# Patient Record
Sex: Female | Born: 1985
Health system: Southern US, Community
[De-identification: ages and names within clinical notes are randomized; demographics above are authoritative.]

## PROBLEM LIST (undated history)

## (undated) DIAGNOSIS — E785 Hyperlipidemia, unspecified: Secondary | ICD-10-CM

## (undated) DIAGNOSIS — O24419 Gestational diabetes mellitus in pregnancy, unspecified control: Secondary | ICD-10-CM

## (undated) DIAGNOSIS — F988 Other specified behavioral and emotional disorders with onset usually occurring in childhood and adolescence: Secondary | ICD-10-CM

## (undated) DIAGNOSIS — F419 Anxiety disorder, unspecified: Secondary | ICD-10-CM

## (undated) DIAGNOSIS — K760 Fatty (change of) liver, not elsewhere classified: Secondary | ICD-10-CM

## (undated) HISTORY — DX: Anxiety disorder, unspecified: F41.9

## (undated) HISTORY — DX: Hyperlipidemia, unspecified: E78.5

## (undated) HISTORY — DX: Fatty (change of) liver, not elsewhere classified: K76.0

## (undated) HISTORY — PX: WISDOM TOOTH EXTRACTION: SHX21

## (undated) HISTORY — DX: Other specified behavioral and emotional disorders with onset usually occurring in childhood and adolescence: F98.8

---

## 2005-12-25 ENCOUNTER — Ambulatory Visit: Payer: Self-pay | Admitting: Family Medicine

## 2011-07-29 ENCOUNTER — Ambulatory Visit (INDEPENDENT_AMBULATORY_CARE_PROVIDER_SITE_OTHER): Payer: 59 | Admitting: Family Medicine

## 2011-07-29 VITALS — BP 140/98 | HR 100 | Temp 98.7°F | Resp 16 | Ht 65.5 in | Wt 179.2 lb

## 2011-07-29 DIAGNOSIS — J019 Acute sinusitis, unspecified: Secondary | ICD-10-CM

## 2011-07-29 DIAGNOSIS — J329 Chronic sinusitis, unspecified: Secondary | ICD-10-CM

## 2011-07-29 NOTE — Progress Notes (Signed)
  Subjective:    Patient ID: Tonya [name], female    DOB: May 10, 1986, 26 y.o.   MRN: [id number]  HPI 5 day h/o sinus pressure sig congestion that is thick and yellow  Using q 4 hours mucinex and tylenol No fever and chills Face pain over Left maxillary sinus   Review of Systems  HENT: Positive for congestion, rhinorrhea, sneezing and postnasal drip.   Respiratory: Positive for cough (not productive). Negative for wheezing.   Musculoskeletal: Negative for myalgias.  Neurological: Negative for headaches.       Objective:   Physical Exam  Constitutional: She appears well-developed and well-nourished.  HENT:  Head: Normocephalic and atraumatic.  Left Ear: Left ear decreased hearing: creumenosis.  Nose: Mucosal edema and rhinorrhea present. Right sinus exhibits maxillary sinus tenderness. Left sinus exhibits maxillary sinus tenderness.  Mouth/Throat: Posterior oropharyngeal erythema present. No posterior oropharyngeal edema.  Eyes: Right eye exhibits no discharge. Left eye exhibits no discharge.  Neck: Neck supple.  Cardiovascular: Normal rate, regular rhythm and normal heart sounds.   Pulmonary/Chest: Effort normal and breath sounds normal.  Lymphadenopathy:    She has cervical adenopathy.  Neurological: She is alert.  Skin: Skin is warm.          Assessment & Plan:  Sinusitis  P) Amoxil 875mg  BID X 10 days      Flonase NS UAD      F/U INB or symptoms worsen

## 2011-09-07 ENCOUNTER — Ambulatory Visit (INDEPENDENT_AMBULATORY_CARE_PROVIDER_SITE_OTHER): Payer: 59 | Admitting: Physician Assistant

## 2011-09-07 VITALS — BP 132/83 | HR 85 | Temp 98.5°F | Resp 16 | Ht 64.5 in | Wt 180.4 lb

## 2011-09-07 DIAGNOSIS — J029 Acute pharyngitis, unspecified: Secondary | ICD-10-CM

## 2011-09-07 LAB — POCT CBC
Granulocyte percent: 77.1 %G (ref 37–80)
Hemoglobin: 12.9 g/dL (ref 12.2–16.2)
Lymph, poc: 1.4 (ref 0.6–3.4)
MCHC: 32.4 g/dL (ref 31.8–35.4)
MPV: 8.4 fL (ref 0–99.8)
POC Granulocyte: 6.2 (ref 2–6.9)
POC MID %: 5.3 %M (ref 0–12)
RDW, POC: 13.4 %

## 2011-09-07 LAB — POCT RAPID STREP A (OFFICE): Rapid Strep A Screen: NEGATIVE

## 2011-09-07 MED ORDER — GUAIFENESIN ER 1200 MG PO TB12: 1.0000 | ORAL_TABLET | Freq: Two times a day (BID) | ORAL | Status: DC | PRN

## 2011-09-07 MED ORDER — CEFDINIR 300 MG PO CAPS: 600.0000 mg | ORAL_CAPSULE | Freq: Every day | ORAL | Status: AC

## 2011-09-07 NOTE — Progress Notes (Signed)
  Subjective:    Patient ID: Tonya [name], female    DOB: [birth year], 26 y.o.   MRN: [id number]  HPI Presents with 2 days of illness. Primary complaint is of sorethroat, though she also has mild rhinitis, muscle aches and fatigue.  She works as an Charity fundraiser in oncology.  No GU/GI symptoms.  No fever/chills.  She did have a flu vaccine this season.  About a month ago she was seen here with sinusitis.  Her symptoms resolved for the most part, but she reports she hasn't quite returned to her previous state of health.   Review of Systems As above.    Objective:   Physical Exam Vital signs noted. Well-developed, well nourished WF who is awake, alert and oriented, in NAD. HEENT: [place]/AT, PERRL, EOMI.  Sclera and conjunctiva are clear.  EAC are patent, TMs are normal in appearance. Nasal mucosa is pink and moist. OP is clear. Neck: supple, non-tender, no lymphadenopathey, thyromegaly. Heart: RRR, no murmur Lungs: CTA Abdomen: normo-active bowel sounds, supple, non-tender, no mass or organomegaly. Extremities: no cyanosis, clubbing or edema. Skin: warm and dry without rash.  Results for orders placed in visit on 09/07/11  POCT RAPID STREP A (OFFICE)      Component Value Range   Rapid Strep A Screen Negative  Negative   POCT INFLUENZA A/B      Component Value Range   Influenza A, POC Negative     Influenza B, POC Negative    POCT CBC      Component Value Range   WBC 8.1  4.6 - 10.2 (K/uL)   Lymph, poc 1.4  0.6 - 3.4    POC LYMPH PERCENT 17.6  10 - 50 (%L)   MID (cbc) 0.4  0 - 0.9    POC MID % 5.3  0 - 12 (%M)   POC Granulocyte 6.2  2 - 6.9    Granulocyte percent 77.1  37 - 80 (%G)   RBC 4.41  4.04 - 5.48 (M/uL)   Hemoglobin 12.9  12.2 - 16.2 (g/dL)   HCT, POC 82.9  56.2 - 47.9 (%)   MCV 90.3  80 - 97 (fL)   MCH, POC 29.3  27 - 31.2 (pg)   MCHC 32.4  31.8 - 35.4 (g/dL)   RDW, POC 13.0     Platelet Count, POC 195  142 - 424 (K/uL)   MPV 8.4  0 - 99.8 (fL)   Throat culture is  pending.      Assessment & Plan:  Pharyngitis Viral URI vs. Persistent sub-acute sinusitis  Supportive care.  If symptoms persist, fill RX for cefdinir, to cover sinusitis.

## 2011-09-07 NOTE — Patient Instructions (Signed)
Get LOTS of rest.  Drink at least 64 ounces of water daily.  Use ibuprofen and acetaminophen for throat pain.  The Mucinex can help.  It is ok to add Zyrtec or Claritin.  If your symtoms worsen over the next several days, go ahead and fill the prescription for cefdinir.

## 2011-09-09 LAB — CULTURE, GROUP A STREP

## 2013-01-23 ENCOUNTER — Ambulatory Visit
Admission: RE | Admit: 2013-01-23 | Discharge: 2013-01-23 | Disposition: A | Discharge status: Home / Self Care | Payer: 59 | Source: Ambulatory Visit | Attending: Internal Medicine | Admitting: Internal Medicine

## 2013-01-23 ENCOUNTER — Other Ambulatory Visit: Payer: Self-pay | Admitting: Internal Medicine

## 2013-01-29 ENCOUNTER — Other Ambulatory Visit: Payer: Self-pay

## 2013-08-12 ENCOUNTER — Ambulatory Visit: Payer: Self-pay | Admitting: Emergency Medicine

## 2013-08-14 ENCOUNTER — Encounter: Payer: Self-pay | Admitting: *Deleted

## 2013-08-14 DIAGNOSIS — F419 Anxiety disorder, unspecified: Secondary | ICD-10-CM | POA: Insufficient documentation

## 2013-08-14 DIAGNOSIS — D649 Anemia, unspecified: Secondary | ICD-10-CM | POA: Insufficient documentation

## 2013-08-14 DIAGNOSIS — F988 Other specified behavioral and emotional disorders with onset usually occurring in childhood and adolescence: Secondary | ICD-10-CM | POA: Insufficient documentation

## 2013-08-18 ENCOUNTER — Ambulatory Visit (INDEPENDENT_AMBULATORY_CARE_PROVIDER_SITE_OTHER): Payer: 59 | Admitting: Emergency Medicine

## 2013-08-18 ENCOUNTER — Encounter: Payer: Self-pay | Admitting: Emergency Medicine

## 2013-08-18 VITALS — BP 108/72 | HR 78 | Temp 98.4°F | Resp 18 | Ht 65.5 in | Wt 177.0 lb

## 2013-08-18 DIAGNOSIS — E559 Vitamin D deficiency, unspecified: Secondary | ICD-10-CM

## 2013-08-18 DIAGNOSIS — Z79899 Other long term (current) drug therapy: Secondary | ICD-10-CM

## 2013-08-18 DIAGNOSIS — F988 Other specified behavioral and emotional disorders with onset usually occurring in childhood and adolescence: Secondary | ICD-10-CM

## 2013-08-18 DIAGNOSIS — E538 Deficiency of other specified B group vitamins: Secondary | ICD-10-CM

## 2013-08-18 DIAGNOSIS — E785 Hyperlipidemia, unspecified: Secondary | ICD-10-CM

## 2013-08-18 DIAGNOSIS — E782 Mixed hyperlipidemia: Secondary | ICD-10-CM

## 2013-08-18 LAB — CBC WITH DIFFERENTIAL/PLATELET
Basophils Absolute: 0.1 10*3/uL (ref 0.0–0.1)
Basophils Relative: 1 % (ref 0–1)
EOS PCT: 3 % (ref 0–5)
Eosinophils Absolute: 0.2 10*3/uL (ref 0.0–0.7)
HEMATOCRIT: 39.7 % (ref 36.0–46.0)
HEMOGLOBIN: 13.9 g/dL (ref 12.0–15.0)
LYMPHS ABS: 2.1 10*3/uL (ref 0.7–4.0)
LYMPHS PCT: 37 % (ref 12–46)
MCH: 31.6 pg (ref 26.0–34.0)
MCHC: 35 g/dL (ref 30.0–36.0)
MCV: 90.2 fL (ref 78.0–100.0)
MONO ABS: 0.3 10*3/uL (ref 0.1–1.0)
MONOS PCT: 6 % (ref 3–12)
NEUTROS ABS: 3 10*3/uL (ref 1.7–7.7)
Neutrophils Relative %: 53 % (ref 43–77)
Platelets: 240 10*3/uL (ref 150–400)
RBC: 4.4 MIL/uL (ref 3.87–5.11)
RDW: 12.9 % (ref 11.5–15.5)
WBC: 5.6 10*3/uL (ref 4.0–10.5)

## 2013-08-18 LAB — BASIC METABOLIC PANEL WITH GFR
BUN: 12 mg/dL (ref 6–23)
CHLORIDE: 105 meq/L (ref 96–112)
CO2: 24 meq/L (ref 19–32)
CREATININE: 0.74 mg/dL (ref 0.50–1.10)
Calcium: 9.8 mg/dL (ref 8.4–10.5)
GFR, Est African American: >89 mL/min
Glucose, Bld: 102 mg/dL — ABNORMAL HIGH (ref 70–99)
POTASSIUM: 4.1 meq/L (ref 3.5–5.3)
Sodium: 137 mEq/L (ref 135–145)

## 2013-08-18 LAB — LIPID PANEL
CHOLESTEROL: 177 mg/dL (ref 0–200)
HDL: 43 mg/dL (ref >39)
LDL CALC: 99 mg/dL (ref 0–99)
TRIGLYCERIDES: 173 mg/dL — AB (ref <150)
Total CHOL/HDL Ratio: 4.1 Ratio
VLDL: 35 mg/dL (ref 0–40)

## 2013-08-18 LAB — HEPATIC FUNCTION PANEL
ALT: 51 U/L — AB (ref 0–35)
AST: 25 U/L (ref 0–37)
Albumin: 4.8 g/dL (ref 3.5–5.2)
Alkaline Phosphatase: 43 U/L (ref 39–117)
BILIRUBIN INDIRECT: 0.4 mg/dL (ref 0.2–1.2)
Bilirubin, Direct: 0.1 mg/dL (ref 0.0–0.3)
TOTAL PROTEIN: 7.2 g/dL (ref 6.0–8.3)
Total Bilirubin: 0.5 mg/dL (ref 0.2–1.2)

## 2013-08-18 LAB — MAGNESIUM: Magnesium: 2 mg/dL (ref 1.5–2.5)

## 2013-08-18 LAB — VITAMIN B12: VITAMIN B 12: 635 pg/mL (ref 211–911)

## 2013-08-18 MED ORDER — AMPHETAMINE-DEXTROAMPHETAMINE 20 MG PO TABS: 20.0000 mg | ORAL_TABLET | Freq: Every day | ORAL | Status: DC

## 2013-08-18 MED ORDER — PRAVASTATIN SODIUM 40 MG PO TABS: 40.0000 mg | ORAL_TABLET | Freq: Every day | ORAL | Status: DC

## 2013-08-18 NOTE — Progress Notes (Signed)
Subjective:    Patient ID: Tonya [name], female    DOB: 1986-05-16, 28 y.o.   MRN: [id number]  HPI Comments: 28 yo female cholesterol f/u. She started Pravastatin at last OV and denies any SE. She has + FMHX of hyperlipidemia.  LAST LABS T 223 TG 184 L 146 ALT 66 HEP NEG, U/S ABDOMEN FATTY LIVER 01/24/13 She has improved diet and keeps active. She denies cardio outside of work.   She added Vitamins B12 and Magnesium AD at last OV and has seen mild increase with energy.  She notes Adderall has helped with focus especially at work and also helps her to stay awake, while working 3rd shift. She does not take RX unless she has to work or needs to focus on getting things done. She denies any SE.   Hyperlipidemia  Anxiety       Medication List       This list is accurate as of: 08/18/13 11:59 PM.  Always use your most recent med list.               ALPRAZolam 0.5 MG tablet  Commonly known as:  XANAX  Take 0.5 mg by mouth at bedtime as needed for anxiety.     amphetamine-dextroamphetamine 20 MG tablet  Commonly known as:  ADDERALL  Take 1 tablet (20 mg total) by mouth daily.     amphetamine-dextroamphetamine 20 MG tablet  Commonly known as:  ADDERALL  Take 1 tablet (20 mg total) by mouth daily.  Start taking on:  09/17/2013     amphetamine-dextroamphetamine 20 MG tablet  Commonly known as:  ADDERALL  Take 1 tablet (20 mg total) by mouth daily.  Start taking on:  10/18/2013     FLUoxetine 20 MG capsule  Commonly known as:  PROZAC  Take 60 mg by mouth daily.     Magnesium 250 MG Tabs  Take by mouth 2 (two) times daily.     norethindrone-ethinyl estradiol-iron 1.5-30 MG-MCG tablet  Commonly known as:  MICROGESTIN FE,GILDESS FE,LOESTRIN FE  Take 1 tablet by mouth daily.     pravastatin 40 MG tablet  Commonly known as:  PRAVACHOL  Take 1 tablet (40 mg total) by mouth daily.     vitamin B-12 1000 MCG tablet  Commonly known as:  CYANOCOBALAMIN  Take 2,000 mcg by mouth  daily.        No Known Allergies  Past Medical History  Diagnosis Date  . Anxiety   . ADD (attention deficit disorder)   . Anemia   . Hyperlipidemia       Review of Systems  All other systems reviewed and are negative.   BP 108/72  Pulse 78  Temp(Src) 98.4 F (36.9 C) (Temporal)  Resp 18  Ht 5' 5.5" (1.664 m)  Wt 177 lb (80.287 kg)  BMI 29.00 kg/m2  LMP 06/23/2013     Objective:   Physical Exam  Nursing note and vitals reviewed. Constitutional: She is oriented to person, place, and time. She appears well-developed and well-nourished. No distress.  HENT:  Head: Normocephalic and atraumatic.  Right Ear: External ear normal.  Left Ear: External ear normal.  Nose: Nose normal.  Mouth/Throat: Oropharynx is clear and moist.  Eyes: Conjunctivae and EOM are normal.  Neck: Normal range of motion. Neck supple. No JVD present. No thyromegaly present.  Cardiovascular: Normal rate, regular rhythm, normal heart sounds and intact distal pulses.   Pulmonary/Chest: Effort normal and breath sounds normal.  Abdominal: Soft.  Bowel sounds are normal. She exhibits no distension and no mass. There is no tenderness. There is no rebound and no guarding.  Musculoskeletal: Normal range of motion. She exhibits no edema and no tenderness.  Lymphadenopathy:    She has no cervical adenopathy.  Neurological: She is alert and oriented to person, place, and time. No cranial nerve deficit.  Skin: Skin is warm and dry. No rash noted. No erythema. No pallor.  Psychiatric: She has a normal mood and affect. Her behavior is normal. Judgment and thought content normal.          Assessment & Plan:  1. Cholesterol- recheck labs, Need to eat healthier and exercise AD. 2. ADD- Refill Adderall AD for 2/23, 3/25/ 4/25 Continue AD with good control 3. Vitamin D def/ Anemia- Recheck labs, take Vitamins AD increase green leafy veggies, increase H2O, w/c if any symptom increase.

## 2013-08-18 NOTE — Patient Instructions (Signed)
Fat and Cholesterol Control Diet  Fat and cholesterol levels in your blood and organs are influenced by your diet. High levels of fat and cholesterol may lead to diseases of the heart, small and large blood vessels, gallbladder, liver, and pancreas.  CONTROLLING FAT AND CHOLESTEROL WITH DIET  Although exercise and lifestyle factors are important, your diet is key. That is because certain foods are known to raise cholesterol and others to lower it. The goal is to balance foods for their effect on cholesterol and more importantly, to replace saturated and trans fat with other types of fat, such as monounsaturated fat, polyunsaturated fat, and omega-3 fatty acids.  On average, a person should consume no more than 15 to 17 g of saturated fat daily. Saturated and trans fats are considered "bad" fats, and they will raise LDL cholesterol. Saturated fats are primarily found in animal products such as meats, butter, and cream. However, that does not mean you need to give up all your favorite foods. Today, there are good tasting, low-fat, low-cholesterol substitutes for most of the things you like to eat. Choose low-fat or nonfat alternatives. Choose round or loin cuts of red meat. These types of cuts are lowest in fat and cholesterol. Chicken (without the skin), fish, veal, and ground turkey breast are great choices. Eliminate fatty meats, such as hot dogs and salami. Even shellfish have little or no saturated fat. Have a 3 oz (85 g) portion when you eat lean meat, poultry, or fish.  Trans fats are also called "partially hydrogenated oils." They are oils that have been scientifically manipulated so that they are solid at room temperature resulting in a longer shelf life and improved taste and texture of foods in which they are added. Trans fats are found in stick margarine, some tub margarines, cookies, crackers, and baked goods.   When baking and cooking, oils are a great substitute for butter. The monounsaturated oils are  especially beneficial since it is believed they lower LDL and raise HDL. The oils you should avoid entirely are saturated tropical oils, such as coconut and palm.   Remember to eat a lot from food groups that are naturally free of saturated and trans fat, including fish, fruit, vegetables, beans, grains (barley, rice, couscous, bulgur wheat), and pasta (without cream sauces).   IDENTIFYING FOODS THAT LOWER FAT AND CHOLESTEROL   Soluble fiber may lower your cholesterol. This type of fiber is found in fruits such as apples, vegetables such as broccoli, potatoes, and carrots, legumes such as beans, peas, and lentils, and grains such as barley. Foods fortified with plant sterols (phytosterol) may also lower cholesterol. You should eat at least 2 g per day of these foods for a cholesterol lowering effect.   Read package labels to identify low-saturated fats, trans fat free, and low-fat foods at the supermarket. Select cheeses that have only 2 to 3 g saturated fat per ounce. Use a heart-healthy tub margarine that is free of trans fats or partially hydrogenated oil. When buying baked goods (cookies, crackers), avoid partially hydrogenated oils. Breads and muffins should be made from whole grains (whole-wheat or whole oat flour, instead of "flour" or "enriched flour"). Buy non-creamy canned soups with reduced salt and no added fats.   FOOD PREPARATION TECHNIQUES   Never deep-fry. If you must fry, either stir-fry, which uses very little fat, or use non-stick cooking sprays. When possible, broil, bake, or roast meats, and steam vegetables. Instead of putting butter or margarine on vegetables, use lemon   and herbs, applesauce, and cinnamon (for squash and sweet potatoes). Use nonfat yogurt, salsa, and low-fat dressings for salads.   LOW-SATURATED FAT / LOW-FAT FOOD SUBSTITUTES  Meats / Saturated Fat (g)  · Avoid: Steak, marbled (3 oz/85 g) / 11 g  · Choose: Steak, lean (3 oz/85 g) / 4 g  · Avoid: Hamburger (3 oz/85 g) / 7  g  · Choose: Hamburger, lean (3 oz/85 g) / 5 g  · Avoid: Ham (3 oz/85 g) / 6 g  · Choose: Ham, lean cut (3 oz/85 g) / 2.4 g  · Avoid: Chicken, with skin, dark meat (3 oz/85 g) / 4 g  · Choose: Chicken, skin removed, dark meat (3 oz/85 g) / 2 g  · Avoid: Chicken, with skin, light meat (3 oz/85 g) / 2.5 g  · Choose: Chicken, skin removed, light meat (3 oz/85 g) / 1 g  Dairy / Saturated Fat (g)  · Avoid: Whole milk (1 cup) / 5 g  · Choose: Low-fat milk, 2% (1 cup) / 3 g  · Choose: Low-fat milk, 1% (1 cup) / 1.5 g  · Choose: Skim milk (1 cup) / 0.3 g  · Avoid: Hard cheese (1 oz/28 g) / 6 g  · Choose: Skim milk cheese (1 oz/28 g) / 2 to 3 g  · Avoid: Cottage cheese, 4% fat (1 cup) / 6.5 g  · Choose: Low-fat cottage cheese, 1% fat (1 cup) / 1.5 g  · Avoid: Ice cream (1 cup) / 9 g  · Choose: Sherbet (1 cup) / 2.5 g  · Choose: Nonfat frozen yogurt (1 cup) / 0.3 g  · Choose: Frozen fruit bar / trace  · Avoid: Whipped cream (1 tbs) / 3.5 g  · Choose: Nondairy whipped topping (1 tbs) / 1 g  Condiments / Saturated Fat (g)  · Avoid: Mayonnaise (1 tbs) / 2 g  · Choose: Low-fat mayonnaise (1 tbs) / 1 g  · Avoid: Butter (1 tbs) / 7 g  · Choose: Extra light margarine (1 tbs) / 1 g  · Avoid: Coconut oil (1 tbs) / 11.8 g  · Choose: Olive oil (1 tbs) / 1.8 g  · Choose: Corn oil (1 tbs) / 1.7 g  · Choose: Safflower oil (1 tbs) / 1.2 g  · Choose: Sunflower oil (1 tbs) / 1.4 g  · Choose: Soybean oil (1 tbs) / 2.4 g  · Choose: Canola oil (1 tbs) / 1 g  Document Released: 06/12/2005 Document Revised: 10/07/2012 Document Reviewed: 12/01/2010  [employer]® Patient Information ©2014 [employer].

## 2013-08-19 ENCOUNTER — Encounter: Payer: Self-pay | Admitting: Emergency Medicine

## 2013-08-19 DIAGNOSIS — E785 Hyperlipidemia, unspecified: Secondary | ICD-10-CM | POA: Insufficient documentation

## 2013-11-27 ENCOUNTER — Other Ambulatory Visit: Payer: Self-pay | Admitting: Emergency Medicine

## 2013-11-27 MED ORDER — AMPHETAMINE-DEXTROAMPHETAMINE 20 MG PO TABS: 20.0000 mg | ORAL_TABLET | Freq: Every day | ORAL | Status: DC

## 2013-12-03 ENCOUNTER — Telehealth: Payer: Self-pay | Admitting: Internal Medicine

## 2014-02-04 NOTE — Telephone Encounter (Signed)
error 

## 2014-03-11 ENCOUNTER — Encounter: Payer: Self-pay | Admitting: Emergency Medicine

## 2014-03-11 ENCOUNTER — Ambulatory Visit (INDEPENDENT_AMBULATORY_CARE_PROVIDER_SITE_OTHER): Payer: 59 | Admitting: Emergency Medicine

## 2014-03-11 VITALS — BP 120/68 | HR 92 | Temp 98.2°F | Resp 16 | Ht 65.5 in | Wt 178.0 lb

## 2014-03-11 DIAGNOSIS — Z0000 Encounter for general adult medical examination without abnormal findings: Secondary | ICD-10-CM

## 2014-03-11 DIAGNOSIS — F988 Other specified behavioral and emotional disorders with onset usually occurring in childhood and adolescence: Secondary | ICD-10-CM

## 2014-03-11 DIAGNOSIS — E782 Mixed hyperlipidemia: Secondary | ICD-10-CM

## 2014-03-11 DIAGNOSIS — L708 Other acne: Secondary | ICD-10-CM

## 2014-03-11 DIAGNOSIS — L709 Acne, unspecified: Secondary | ICD-10-CM

## 2014-03-11 LAB — CBC WITH DIFFERENTIAL/PLATELET
BASOS ABS: 0.1 10*3/uL (ref 0.0–0.1)
BASOS PCT: 1 % (ref 0–1)
EOS PCT: 2 % (ref 0–5)
Eosinophils Absolute: 0.1 10*3/uL (ref 0.0–0.7)
HEMATOCRIT: 40.2 % (ref 36.0–46.0)
Hemoglobin: 13.9 g/dL (ref 12.0–15.0)
Lymphocytes Relative: 27 % (ref 12–46)
Lymphs Abs: 1.8 10*3/uL (ref 0.7–4.0)
MCH: 30.5 pg (ref 26.0–34.0)
MCHC: 34.6 g/dL (ref 30.0–36.0)
MCV: 88.4 fL (ref 78.0–100.0)
MONO ABS: 0.3 10*3/uL (ref 0.1–1.0)
Monocytes Relative: 5 % (ref 3–12)
NEUTROS ABS: 4.4 10*3/uL (ref 1.7–7.7)
Neutrophils Relative %: 65 % (ref 43–77)
PLATELETS: 214 10*3/uL (ref 150–400)
RBC: 4.55 MIL/uL (ref 3.87–5.11)
RDW: 12.8 % (ref 11.5–15.5)
WBC: 6.8 10*3/uL (ref 4.0–10.5)

## 2014-03-11 LAB — MAGNESIUM: Magnesium: 2 mg/dL (ref 1.5–2.5)

## 2014-03-11 LAB — BASIC METABOLIC PANEL WITH GFR
BUN: 14 mg/dL (ref 6–23)
CALCIUM: 9.8 mg/dL (ref 8.4–10.5)
CO2: 27 mEq/L (ref 19–32)
Chloride: 103 mEq/L (ref 96–112)
Creat: 0.72 mg/dL (ref 0.50–1.10)
GLUCOSE: 82 mg/dL (ref 70–99)
POTASSIUM: 4.1 meq/L (ref 3.5–5.3)
SODIUM: 139 meq/L (ref 135–145)

## 2014-03-11 LAB — LIPID PANEL
CHOL/HDL RATIO: 4.5 ratio
Cholesterol: 192 mg/dL (ref 0–200)
HDL: 43 mg/dL (ref >39)
LDL CALC: 119 mg/dL — AB (ref 0–99)
Triglycerides: 152 mg/dL — ABNORMAL HIGH (ref <150)
VLDL: 30 mg/dL (ref 0–40)

## 2014-03-11 LAB — IRON AND TIBC
%SAT: 26 % (ref 20–55)
Iron: 97 ug/dL (ref 42–145)
TIBC: 380 ug/dL (ref 250–470)
UIBC: 283 ug/dL (ref 125–400)

## 2014-03-11 LAB — HEPATIC FUNCTION PANEL
ALBUMIN: 4.9 g/dL (ref 3.5–5.2)
ALK PHOS: 62 U/L (ref 39–117)
ALT: 29 U/L (ref 0–35)
AST: 20 U/L (ref 0–37)
BILIRUBIN DIRECT: 0.1 mg/dL (ref 0.0–0.3)
BILIRUBIN INDIRECT: 0.6 mg/dL (ref 0.2–1.2)
BILIRUBIN TOTAL: 0.7 mg/dL (ref 0.2–1.2)
Total Protein: 7.2 g/dL (ref 6.0–8.3)

## 2014-03-11 MED ORDER — DOXYCYCLINE HYCLATE 50 MG PO CAPS: 50.0000 mg | ORAL_CAPSULE | Freq: Two times a day (BID) | ORAL | Status: DC

## 2014-03-11 MED ORDER — CLINDAMYCIN PHOS-BENZOYL PEROX 1-5 % EX GEL: Freq: Two times a day (BID) | CUTANEOUS | Status: DC

## 2014-03-11 MED ORDER — AMPHETAMINE-DEXTROAMPHETAMINE 20 MG PO TABS: 20.0000 mg | ORAL_TABLET | Freq: Two times a day (BID) | ORAL | Status: DC

## 2014-03-11 NOTE — Patient Instructions (Signed)
Adderall 20 mg at beginning of shift and then an extra 1/2 = 10 mg at 4 hours into shift to see if helps focus  Attention Deficit Hyperactivity Disorder Attention deficit hyperactivity disorder (ADHD) is a problem with behavior issues based on the way the brain functions (neurobehavioral disorder). It is a common reason for behavior and academic problems in school. SYMPTOMS  There are 3 types of ADHD. The 3 types and some of the symptoms include:  Inattentive.  Gets bored or distracted easily.  Loses or forgets things. Forgets to hand in homework.  Has trouble organizing or completing tasks.  Difficulty staying on task.  An inability to organize daily tasks and school work.  Leaving projects, chores, or homework unfinished.  Trouble paying attention or responding to details. Careless mistakes.  Difficulty following directions. Often seems like is not listening.  Dislikes activities that require sustained attention (like chores or homework).  Hyperactive-impulsive.  Feels like it is impossible to sit still or stay in a seat. Fidgeting with hands and feet.  Trouble waiting turn.  Talking too much or out of turn. Interruptive.  Speaks or acts impulsively.  Aggressive, disruptive behavior.  Constantly busy or on the go; noisy.  Often leaves seat when they are expected to remain seated.  Often runs or climbs where it is not appropriate, or feels very restless.  Combined.  Has symptoms of both of the above. Often children with ADHD feel discouraged about themselves and with school. They often perform well below their abilities in school. As children get older, the excess motor activities can calm down, but the problems with paying attention and staying organized persist. Most children do not outgrow ADHD but with good treatment can learn to cope with the symptoms. DIAGNOSIS  When ADHD is suspected, the diagnosis should be made by professionals trained in ADHD. This  professional will collect information about the individual suspected of having ADHD. Information must be collected from various settings where the person lives, works, or attends school.  Diagnosis will include:  Confirming symptoms began in childhood.  Ruling out other reasons for the child's behavior.  The health care providers will check with the child's school and check their medical records.  They will talk to teachers and parents.  Behavior rating scales for the child will be filled out by those dealing with the child on a daily basis. A diagnosis is made only after all information has been considered. TREATMENT  Treatment usually includes behavioral treatment, tutoring or extra support in school, and stimulant medicines. Because of the way a person's brain works with ADHD, these medicines decrease impulsivity and hyperactivity and increase attention. This is different than how they would work in a person who does not have ADHD. Other medicines used include antidepressants and certain blood pressure medicines. Most experts agree that treatment for ADHD should address all aspects of the person's functioning. Along with medicines, treatment should include structured classroom management at school. Parents should reward good behavior, provide constant discipline, and set limits. Tutoring should be available for the child as needed. ADHD is a lifelong condition. If untreated, the disorder can have long-term serious effects into adolescence and adulthood. HOME CARE INSTRUCTIONS   Often with ADHD there is a lot of frustration among family members dealing with the condition. Blame and anger are also feelings that are common. In many cases, because the problem affects the family as a whole, the entire family may need help. A therapist can help the family find  better ways to handle the disruptive behaviors of the person with ADHD and promote change. If the person with ADHD is young, most of the  therapist's work is with the parents. Parents will learn techniques for coping with and improving their child's behavior. Sometimes only the child with the ADHD needs counseling. Your health care providers can help you make these decisions.  Children with ADHD may need help learning how to organize. Some helpful tips include:  Keep routines the same every day from wake-up time to bedtime. Schedule all activities, including homework and playtime. Keep the schedule in a place where the person with ADHD will often see it. Mark schedule changes as far in advance as possible.  Schedule outdoor and indoor recreation.  Have a place for everything and keep everything in its place. This includes clothing, backpacks, and school supplies.  Encourage writing down assignments and bringing home needed books. Work with your child's teachers for assistance in organizing school work.  Offer your child a well-balanced diet. Breakfast that includes a balance of whole grains, protein, and fruits or vegetables is especially important for school performance. Children should avoid drinks with caffeine including:  Soft drinks.  Coffee.  Tea.  However, some older children (adolescents) may find these drinks helpful in improving their attention. Because it can also be common for adolescents with ADHD to become addicted to caffeine, talk with your health care provider about what is a safe amount of caffeine intake for your child.  Children with ADHD need consistent rules that they can understand and follow. If rules are followed, give small rewards. Children with ADHD often receive, and expect, criticism. Look for good behavior and praise it. Set realistic goals. Give clear instructions. Look for activities that can foster success and self-esteem. Make time for pleasant activities with your child. Give lots of affection.  Parents are their children's greatest advocates. Learn as much as possible about ADHD. This helps  you become a stronger and better advocate for your child. It also helps you educate your child's teachers and instructors if they feel inadequate in these areas. Parent support groups are often helpful. A national group with local chapters is called Children and Adults with Attention Deficit Hyperactivity Disorder (CHADD). SEEK MEDICAL CARE IF:  Your child has repeated muscle twitches, cough, or speech outbursts.  Your child has sleep problems.  Your child has a marked loss of appetite.  Your child develops depression.  Your child has new or worsening behavioral problems.  Your child develops dizziness.  Your child has a racing heart.  Your child has stomach pains.  Your child develops headaches. SEEK IMMEDIATE MEDICAL CARE IF:  Your child has been diagnosed with depression or anxiety and the symptoms seem to be getting worse.  Your child has been depressed and suddenly appears to have increased energy or motivation.  You are worried that your child is having a bad reaction to a medication he or she is taking for ADHD. Document Released: 06/02/2002 Document Revised: 06/17/2013 Document Reviewed: 02/17/2013 [facility] Patient Information 2015 [place], [place]. This information is not intended to replace advice given to you by your health care provider. Make sure you discuss any questions you have with your health care provider.

## 2014-03-11 NOTE — Progress Notes (Signed)
Subjective:    Patient ID: Tonya [name], female    DOB: [birth year], 28 y.o.   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 She stopped counseling for anxiety and has been off Prozac x 6 months and doing well. She has been eating better. She is trying to lose weight. She has been sporatic with Pravastatin. She is keeping active.   She is on Adderall for focus at work. She notes she often feels it wears off before shift is completed. She denies any adverse SE with Adderall.   She notes mild back discomfort after long 12 hour shifts. She denies strain or injury.  She is also concerned with increased acne and would like to try RX since her face is worse and she will be getting married soon.    Hyperlipidemia  Anemia  Back Pain      Medication List       This list is accurate as of: 03/11/14  3:49 PM.  Always use your most recent med list.               ALPRAZolam 0.5 MG tablet  Commonly known as:  XANAX  Take 0.5 mg by mouth at bedtime as needed for anxiety.     amphetamine-dextroamphetamine 20 MG tablet  Commonly known as:  ADDERALL  Take 1 tablet (20 mg total) by mouth daily.     Magnesium 250 MG Tabs  Take by mouth 2 (two) times daily.     norethindrone-ethinyl estradiol-iron 1.5-30 MG-MCG tablet  Commonly known as:  MICROGESTIN FE,GILDESS FE,LOESTRIN FE  Take 1 tablet by mouth daily.     pravastatin 40 MG tablet  Commonly known as:  PRAVACHOL  Take 1 tablet (40 mg total) by mouth daily.     vitamin B-12 1000 MCG tablet  Commonly known as:  CYANOCOBALAMIN  Take 2,000 mcg by mouth daily.      No Known Allergies Past Medical History  Diagnosis Date  . Anxiety   . ADD (attention deficit disorder)   . Anemia   . Hyperlipidemia    No past surgical history on file.  History  Substance Use Topics  . Smoking status: Never Smoker   . Smokeless tobacco: Not on file  . Alcohol Use: Not on file   Family History  Problem Relation Age of Onset  .  Hypertension Mother   . Depression Mother   . Hypertension Father   . Cancer Father     prostate    Patient Care Team: Unk Pinto, MD as PCP - General (Internal Medicine) Vernell Morgans, MD as Referring Physician (Obstetrics and Gynecology) Len Childs, [facility]) Felton Clinton, (Dentist)  MAINTENANCE: Pap/ Pelvic:2014 WNL @gyn  QIW:LNLGXQ, contacts Dentist: q 6 months  IMMUNIZATIONS: Tdap: PT Notes Up to date Influenza:2014    Review of Systems  Musculoskeletal: Positive for back pain.  Psychiatric/Behavioral: Positive for decreased concentration.  All other systems reviewed and are negative.  BP 120/68  Pulse 92  Temp(Src) 98.2 F (36.8 C) (Temporal)  Resp 16  Ht 5' 5.5" (1.664 m)  Wt 178 lb (80.74 kg)  BMI 29.16 kg/m2  LMP 02/12/2014     Objective:   Physical Exam  Nursing note and vitals reviewed. Constitutional: She is oriented to person, place, and time. She appears well-developed and well-nourished. No distress.  HENT:  Head: Normocephalic and atraumatic.  Right Ear: External ear normal.  Left Ear: External ear normal.  Nose: Nose normal.  Mouth/Throat: Oropharynx is clear and moist.  Eyes: Conjunctivae and EOM are normal. Pupils are equal, round, and reactive to light. Right eye exhibits no discharge. Left eye exhibits no discharge. No scleral icterus.  Neck: Normal range of motion. Neck supple. No JVD present. No tracheal deviation present. No thyromegaly present.  Cardiovascular: Normal rate, regular rhythm, normal heart sounds and intact distal pulses.   Pulmonary/Chest: Effort normal and breath sounds normal.  Abdominal: Soft. Bowel sounds are normal. She exhibits no distension and no mass. There is no tenderness. There is no rebound and no guarding.  Genitourinary:  Def gyn  Musculoskeletal: Normal range of motion. She exhibits no edema and no tenderness.  Lymphadenopathy:    She has no cervical adenopathy.  Neurological: She is alert and oriented to person,  place, and time. She has normal reflexes. No cranial nerve deficit. She exhibits normal muscle tone. Coordination normal.  Skin: Skin is warm and dry. No rash noted. No erythema. No pallor.  Psychiatric: She has a normal mood and affect. Her behavior is normal. Judgment and thought content normal.     EKG NSCSPT WNL     Assessment & Plan:  1. CPE- Update screening labs/ History/ Immunizations/ Testing as needed. Advised healthy diet, QD exercise, increase H20 and continue RX/ Vitamins AD.  2. ADD- refill RX AD. Adderall 20 mg at beginning of shift and then an extra 1/2 = 10 mg at 4 hours into shift to see if helps focus  3. Cholesterol- recheck labs, Need to eat healthier and exercise AD.  4. Back strain- advise try stretching several times during shift, increase water intake. F/u with results  5. Acne- try RX AD of Doxy and Clindamycin topical. If no change f/u DERM

## 2014-03-12 LAB — URINALYSIS, ROUTINE W REFLEX MICROSCOPIC
BILIRUBIN URINE: NEGATIVE
Glucose, UA: NEGATIVE mg/dL
Hgb urine dipstick: NEGATIVE
KETONES UR: NEGATIVE mg/dL
Leukocytes, UA: NEGATIVE
Nitrite: NEGATIVE
PH: 5.5 (ref 5.0–8.0)
Protein, ur: NEGATIVE mg/dL
SPECIFIC GRAVITY, URINE: 1.009 (ref 1.005–1.030)
Urobilinogen, UA: 0.2 mg/dL (ref 0.0–1.0)

## 2014-03-12 LAB — FOLATE RBC: RBC FOLATE: 808 ng/mL (ref >280)

## 2014-03-12 LAB — VITAMIN B12: VITAMIN B 12: 525 pg/mL (ref 211–911)

## 2014-03-12 LAB — TSH: TSH: 1.405 u[IU]/mL (ref 0.350–4.500)

## 2014-03-12 LAB — HEMOGLOBIN A1C
Hgb A1c MFr Bld: 5.6 % (ref <5.7)
MEAN PLASMA GLUCOSE: 114 mg/dL (ref <117)

## 2014-03-12 LAB — VITAMIN D 25 HYDROXY (VIT D DEFICIENCY, FRACTURES): Vit D, 25-Hydroxy: 45 ng/mL (ref 30–89)

## 2014-04-17 ENCOUNTER — Other Ambulatory Visit: Payer: Self-pay | Admitting: Physician Assistant

## 2014-04-17 DIAGNOSIS — F988 Other specified behavioral and emotional disorders with onset usually occurring in childhood and adolescence: Secondary | ICD-10-CM

## 2014-04-17 MED ORDER — AMPHETAMINE-DEXTROAMPHETAMINE 20 MG PO TABS: 20.0000 mg | ORAL_TABLET | Freq: Two times a day (BID) | ORAL | Status: DC

## 2014-05-26 ENCOUNTER — Other Ambulatory Visit: Payer: Self-pay | Admitting: Physician Assistant

## 2014-05-26 DIAGNOSIS — F988 Other specified behavioral and emotional disorders with onset usually occurring in childhood and adolescence: Secondary | ICD-10-CM

## 2014-05-26 MED ORDER — AMPHETAMINE-DEXTROAMPHETAMINE 20 MG PO TABS: 20.0000 mg | ORAL_TABLET | Freq: Two times a day (BID) | ORAL | Status: DC

## 2014-06-29 ENCOUNTER — Other Ambulatory Visit: Payer: Self-pay | Admitting: Physician Assistant

## 2014-06-29 DIAGNOSIS — F988 Other specified behavioral and emotional disorders with onset usually occurring in childhood and adolescence: Secondary | ICD-10-CM

## 2014-06-29 MED ORDER — AMPHETAMINE-DEXTROAMPHETAMINE 20 MG PO TABS: 20.0000 mg | ORAL_TABLET | Freq: Two times a day (BID) | ORAL | Status: DC

## 2014-07-21 ENCOUNTER — Ambulatory Visit: Payer: Self-pay | Admitting: Physician Assistant

## 2014-08-03 ENCOUNTER — Other Ambulatory Visit: Payer: Self-pay | Admitting: Physician Assistant

## 2014-08-03 DIAGNOSIS — F988 Other specified behavioral and emotional disorders with onset usually occurring in childhood and adolescence: Secondary | ICD-10-CM

## 2014-08-03 MED ORDER — AMPHETAMINE-DEXTROAMPHETAMINE 20 MG PO TABS: 20.0000 mg | ORAL_TABLET | Freq: Two times a day (BID) | ORAL | Status: DC

## 2014-08-05 ENCOUNTER — Encounter: Payer: Self-pay | Admitting: Physician Assistant

## 2014-08-05 ENCOUNTER — Ambulatory Visit (INDEPENDENT_AMBULATORY_CARE_PROVIDER_SITE_OTHER): Payer: 59 | Admitting: Physician Assistant

## 2014-08-05 VITALS — BP 120/78 | HR 80 | Temp 97.9°F | Resp 16 | Ht 65.5 in | Wt 175.0 lb

## 2014-08-05 DIAGNOSIS — E785 Hyperlipidemia, unspecified: Secondary | ICD-10-CM

## 2014-08-05 DIAGNOSIS — L709 Acne, unspecified: Secondary | ICD-10-CM

## 2014-08-05 LAB — CBC WITH DIFFERENTIAL/PLATELET
BASOS ABS: 0.1 10*3/uL (ref 0.0–0.1)
Basophils Relative: 1 % (ref 0–1)
EOS ABS: 0.2 10*3/uL (ref 0.0–0.7)
EOS PCT: 3 % (ref 0–5)
HCT: 43.1 % (ref 36.0–46.0)
Hemoglobin: 14.5 g/dL (ref 12.0–15.0)
LYMPHS PCT: 36 % (ref 12–46)
Lymphs Abs: 1.9 10*3/uL (ref 0.7–4.0)
MCH: 30 pg (ref 26.0–34.0)
MCHC: 33.6 g/dL (ref 30.0–36.0)
MCV: 89 fL (ref 78.0–100.0)
MONO ABS: 0.3 10*3/uL (ref 0.1–1.0)
MPV: 9.9 fL (ref 8.6–12.4)
Monocytes Relative: 6 % (ref 3–12)
Neutro Abs: 2.9 10*3/uL (ref 1.7–7.7)
Neutrophils Relative %: 54 % (ref 43–77)
PLATELETS: 219 10*3/uL (ref 150–400)
RBC: 4.84 MIL/uL (ref 3.87–5.11)
RDW: 12.9 % (ref 11.5–15.5)
WBC: 5.4 10*3/uL (ref 4.0–10.5)

## 2014-08-05 MED ORDER — DOXYCYCLINE HYCLATE 50 MG PO CAPS: 50.0000 mg | ORAL_CAPSULE | Freq: Two times a day (BID) | ORAL | Status: DC

## 2014-08-05 NOTE — Progress Notes (Signed)
Assessment and Plan:  Hypertension: Continue medication, monitor blood pressure at home. Continue DASH diet.  Reminder to go to the ER if any CP, SOB, nausea, dizziness, severe HA, changes vision/speech, left arm numbness and tingling, and jaw pain. Cholesterol: Continue diet and exercise. Check cholesterol.  Vitamin D Def- check level and continue medications. Weight loss for wedding, declines phentermine at this time.    Continue diet and meds as discussed. Further disposition pending results of labs.  HPI 29 y.o. female  presents for 3 month follow up with hypertension, hyperlipidemia, prediabetes and vitamin D.  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   She does workout, walks some, and getting married in Oct. She denies chest pain, shortness of breath, dizziness. She has been losing weight, getting married in Oct and has been trying to lose weight.  BMI is Body mass index is 28.67 kg/(m^2)., she is working on diet and exercise. Wt Readings from Last 3 Encounters:  08/05/14 175 lb (79.379 kg)  03/11/14 178 lb (80.74 kg)  08/18/13 177 lb (80.287 kg)   She is on cholesterol medication, she is on pravastatin sporadicly and denies myalgias. Her cholesterol is at goal. The cholesterol last visit was:   Lab Results  Component Value Date   CHOL 192 03/11/2014   HDL 43 03/11/2014   LDLCALC 119* 03/11/2014   TRIG 152* 03/11/2014   CHOLHDL 4.5 03/11/2014  Last A1C in the office was:  Lab Results  Component Value Date   HGBA1C 5.6 03/11/2014  Patient is not on Vitamin D supplement.   Lab Results  Component Value Date   VD25OH 45 03/11/2014     Current Medications:  Current Outpatient Prescriptions on File Prior to Visit  Medication Sig Dispense Refill  . ALPRAZolam (XANAX) 0.5 MG tablet Take 0.5 mg by mouth at bedtime as needed for anxiety.    Marland Kitchen amphetamine-dextroamphetamine (ADDERALL) 20 MG tablet Take 1  tablet (20 mg total) by mouth 2 (two) times daily. 60 tablet 0  . clindamycin-benzoyl peroxide (BENZACLIN) gel Apply topically 2 (two) times daily. 25 g 1  . doxycycline (VIBRAMYCIN) 50 MG capsule Take 1 capsule (50 mg total) by mouth 2 (two) times daily. 30 capsule 3  . Magnesium 250 MG TABS Take by mouth 2 (two) times daily.    . norethindrone-ethinyl estradiol-iron (MICROGESTIN FE,GILDESS FE,LOESTRIN FE) 1.5-30 MG-MCG tablet Take 1 tablet by mouth daily.    . pravastatin (PRAVACHOL) 40 MG tablet Take 1 tablet (40 mg total) by mouth daily. 90 tablet 1  . vitamin B-12 (CYANOCOBALAMIN) 1000 MCG tablet Take 2,000 mcg by mouth daily.     No current facility-administered medications on file prior to visit.   Medical History:  Past Medical History  Diagnosis Date  . Anxiety   . ADD (attention deficit disorder)   . Anemia   . Hyperlipidemia    Allergies: No Known Allergies   Review of Systems:  Review of Systems  Constitutional: Negative.   HENT: Negative.   Eyes: Negative.   Respiratory: Negative.   Cardiovascular: Negative.   Gastrointestinal: Negative.   Genitourinary: Negative.   Musculoskeletal: Negative.   Skin: Negative.   Neurological: Negative.   Endo/Heme/Allergies: Negative.   Psychiatric/Behavioral: Negative.     Family history- Review and unchanged Social history- Review and unchanged Physical Exam: BP 120/78 mmHg  Pulse 80  Temp(Src) 97.9 F (36.6 C)  Resp 16  Ht  5' 5.5" (1.664 m)  Wt 175 lb (79.379 kg)  BMI 28.67 kg/m2 Wt Readings from Last 3 Encounters:  08/05/14 175 lb (79.379 kg)  03/11/14 178 lb (80.74 kg)  08/18/13 177 lb (80.287 kg)   General Appearance: Well nourished, in no apparent distress. Eyes: PERRLA, EOMs, conjunctiva no swelling or erythema Sinuses: No Frontal/maxillary tenderness ENT/Mouth: Ext aud canals clear, TMs without erythema, bulging. No erythema, swelling, or exudate on post pharynx.  Tonsils not swollen or erythematous.  Hearing normal.  Neck: Supple, thyroid normal.  Respiratory: Respiratory effort normal, BS equal bilaterally without rales, rhonchi, wheezing or stridor.  Cardio: RRR with no MRGs. Brisk peripheral pulses without edema.  Abdomen: Soft, + BS,  Non tender, no guarding, rebound, hernias, masses. Lymphatics: Non tender without lymphadenopathy.  Musculoskeletal: Full ROM, 5/5 strength, Normal gait Skin: Warm, dry without rashes, lesions, ecchymosis.  Neuro: Cranial nerves intact. Normal muscle tone, no cerebellar symptoms. Psych: Awake and oriented X 3, normal affect, Insight and Judgment appropriate.    Vicie Mutters, PA-C 4:21 PM [facility]

## 2014-08-05 NOTE — Patient Instructions (Signed)
Before you even begin to attack a weight-loss plan, it pays to remember this: You are not fat. You have fat. Losing weight isn't about blame or shame; it's simply another achievement to accomplish. Dieting is like any other skill-you have to buckle down and work at it. As long as you act in a smart, reasonable way, you'll ultimately get where you want to be. Here are some weight loss pearls for you.  1. It's Not a Diet. It's a Lifestyle Thinking of a diet as something you're on and suffering through only for the short term doesn't work. To shed weight and keep it off, you need to make permanent changes to the way you eat. It's OK to indulge occasionally, of course, but if you cut calories temporarily and then revert to your old way of eating, you'll gain back the weight quicker than you can say yo-yo. Use it to lose it. Research shows that one of the best predictors of long-term weight loss is how many pounds you drop in the first month. For that reason, nutritionists often suggest being stricter for the first two weeks of your new eating strategy to build momentum. Cut out added sugar and alcohol and avoid unrefined carbs. After that, figure out how you can reincorporate them in a way that's healthy and maintainable.  2. There's a Right Way to Exercise Working out burns calories and fat and boosts your metabolism by building muscle. But those trying to lose weight are notorious for overestimating the number of calories they burn and underestimating the amount they take in. Unfortunately, your system is biologically programmed to hold on to extra pounds and that means when you start exercising, your body senses the deficit and ramps up its hunger signals. If you're not diligent, you'll eat everything you burn and then some. Use it to lose it. Cardio gets all the exercise glory, but strength and interval training are the real heroes. They help you build lean muscle, which in turn increases your metabolism and  calorie-burning ability 3. Don't Overreact to Mild Hunger Some people have a hard time losing weight because of hunger anxiety. To them, being hungry is bad-something to be avoided at all costs-so they carry snacks with them and eat when they don't need to. Others eat because they're stressed out or bored. While you never want to get to the point of being ravenous (that's when bingeing is likely to happen), a hunger pang, a craving, or the fact that it's 3:00 p.m. should not send you racing for the vending machine or obsessing about the energy bar in your purse. Ideally, you should put off eating until your stomach is growling and it's difficult to concentrate.  Use it to lose it. When you feel the urge to eat, use the HALT method. Ask yourself, Am I really hungry? Or am I angry or anxious, lonely or bored, or tired? If you're still not certain, try the apple test. If you're truly hungry, an apple should seem delicious; if it doesn't, something else is going on. Or you can try drinking water and making yourself busy, if you are still hungry try a healthy snack.  4. Not All Calories Are Created Equal The mechanics of weight loss are pretty simple: Take in fewer calories than you use for energy. But the kind of food you eat makes all the difference. Processed food that's high in saturated fat and refined starch or sugar can cause inflammation that disrupts the hormone signals that tell  your brain you're full. The result: You eat a lot more.  Use it to lose it. Clean up your diet. Swap in whole, unprocessed foods, including vegetables, lean protein, and healthy fats that will fill you up and give you the biggest nutritional bang for your calorie buck. In a few weeks, as your brain starts receiving regular hunger and fullness signals once again, you'll notice that you feel less hungry overall and naturally start cutting back on the amount you eat.  5. Protein, Produce, and Plant-Based Fats Are Your Weight-Loss  [place] Here's why eating the three Ps regularly will help you drop pounds. Protein fills you up. You need it to build lean muscle, which keeps your metabolism humming so that you can torch more fat. People in a weight-loss program who ate double the recommended daily allowance for protein (about 110 grams for a 150-pound woman) lost 70 percent of their weight from fat, while people who ate the RDA lost only about 40 percent, one study found. Produce is packed with filling fiber. "It's very difficult to consume too many calories if you're eating a lot of vegetables. Example: Three cups of broccoli is a lot of food, yet only 93 calories. (Fruit is another story. It can be easy to overeat and can contain a lot of calories from sugar, so be sure to monitor your intake.) Plant-based fats like olive oil and those in avocados and nuts are healthy and extra satiating.  Use it to lose it. Aim to incorporate each of the three Ps into every meal and snack. People who eat protein throughout the day are able to keep weight off, according to a study in the American Journal of Clinical Nutrition. In addition to meat, poultry and seafood, good sources are beans, lentils, eggs, tofu, and yogurt. As for fat, keep portion sizes in check by measuring out salad dressing, oil, and nut butters (shoot for one to two tablespoons). Finally, eat veggies or a little fruit at every meal. People who did that consumed 308 fewer calories but didn't feel any hungrier than when they didn't eat more produce.  7. How You Eat Is As Important As What You Eat In order for your brain to register that you're full, you need to focus on what you're eating. Sit down whenever you eat, preferably at a table. Turn off the TV or computer, put down your phone, and look at your food. Smell it. Chew slowly, and don't put another bite on your fork until you swallow. When women ate lunch this attentively, they consumed 30 percent less when snacking later than  those who listened to an audiobook at lunchtime, according to a study in the British Journal of Nutrition. 8. Weighing Yourself Really Works The scale provides the best evidence about whether your efforts are paying off. Seeing the numbers tick up or down or stagnate is motivation to keep going-or to rethink your approach. A 2015 study at [employer] found that daily weigh-ins helped people lose more weight, keep it off, and maintain that loss, even after two years. Use it to lose it. Step on the scale at the same time every day for the best results. If your weight shoots up several pounds from one weigh-in to the next, don't freak out. Eating a lot of salt the night before or having your period is the likely culprit. The number should return to normal in a day or two. It's a steady climb that you need to do something about.   9. Too Much Stress and Too Little Sleep Are Your Enemies When you're tired and frazzled, your body cranks up the production of cortisol, the stress hormone that can cause carb cravings. Not getting enough sleep also boosts your levels of ghrelin, a hormone associated with hunger, while suppressing leptin, a hormone that signals fullness and satiety. People on a diet who slept only five and a half hours a night for two weeks lost 55 percent less fat and were hungrier than those who slept eight and a half hours, according to a study in the [employer] Medical Association Journal. Use it to lose it. Prioritize sleep, aiming for seven hours or more a night, which research shows helps lower stress. And make sure you're getting quality zzz's. If a snoring spouse or a fidgety cat wakes you up frequently throughout the night, you may end up getting the equivalent of just four hours of sleep, according to a study from [employer]. Keep pets out of the bedroom, and use a white-noise app to drown out snoring. 10. You Will Hit a plateau-And You Can Bust Through It As you slim down, your  body releases much less leptin, the fullness hormone.  If you're not strength training, start right now. Building muscle can raise your metabolism to help you overcome a plateau. To keep your body challenged and burning calories, incorporate new moves and more intense intervals into your workouts or add another sweat session to your weekly routine. Alternatively, cut an extra 100 calories or so a day from your diet. Now that you've lost weight, your body simply doesn't need as much fuel.   Ways to cut 100 calories  1. Eat your eggs with hot sauce OR salsa instead of cheese.  Eggs are great for breakfast, but many people consider eggs and cheese to be BFFs. Instead of cheese-1 oz. of cheddar has 114 calories-top your eggs with hot sauce, which contains no calories and helps with satiety and metabolism. Salsa is also a great option!!  2. Top your toast, waffles or pancakes with mashed berries instead of jelly or syrup. Half a cup of berries-fresh, frozen or thawed-has about 40 calories, compared with 2 tbsp. of maple syrup or jelly, which both have about 100 calories. The berries will also give you a good punch of fiber, which helps keep you full and satisfied and won't spike blood sugar quickly like the jelly or syrup. 3. Swap the non-fat latte for black coffee with a splash of half-and-half. Contrary to its name, that non-fat latte has 130 calories and a startling 19g of carbohydrates per 16 oz. serving. Replacing that 'light' drinkable dessert with a black coffee with a splash of half-and-half saves you more than 100 calories per 16 oz. serving. 4. Sprinkle salads with freeze-dried raspberries instead of dried cranberries. If you want a sweet addition to your nutritious salad, stay away from dried cranberries. They have a whopping 130 calories per  cup and 30g carbohydrates. Instead, sprinkle freeze-dried raspberries guilt-free and save more than 100 calories per  cup serving, adding 3g of belly-filling  fiber. 5. Go for mustard in place of mayo on your sandwich. Mustard can add really nice flavor to any sandwich, and there are tons of varieties, from spicy to honey. A serving of mayo is 95 calories, versus 10 calories in a serving of mustard. 6. Choose a DIY salad dressing instead of the store-bought kind. Mix Dijon or whole grain mustard with low-fat Kefir or red wine vinegar   and garlic. 7. Use hummus as a spread instead of a dip. Use hummus as a spread on a high-fiber cracker or tortilla with a sandwich and save on calories without sacrificing taste. 8. Pick just one salad "accessory." Salad isn't automatically a calorie winner. It's easy to over-accessorize with toppings. Instead of topping your salad with nuts, avocado and cranberries (all three will clock in at 313 calories), just pick one. The next day, choose a different accessory, which will also keep your salad interesting. You don't wear all your jewelry every day, right? 9. Ditch the white pasta in favor of spaghetti squash. One cup of cooked spaghetti squash has about 40 calories, compared with traditional spaghetti, which comes with more than 200. Spaghetti squash is also nutrient-dense. It's a good source of fiber and Vitamins A and C, and it can be eaten just like you would eat pasta-with a great tomato sauce and [place] meatballs or with pesto, tofu and spinach, for example. 10. Dress up your chili, soups and stews with non-fat [place] yogurt instead of sour cream. Just a 'dollop' of sour cream can set you back 115 calories and a whopping 12g of fat-seven of which are of the artery-clogging variety. Added bonus: [place] yogurt is packed with muscle-building protein, calcium and B Vitamins. 11. Mash cauliflower instead of mashed potatoes. One cup of traditional mashed potatoes-in all their creamy goodness-has more than 200 calories, compared to mashed cauliflower, which you can typically eat for less than 100 calories per 1 cup serving.  Cauliflower is a great source of the antioxidant indole-3-carbinol (I3C), which may help reduce the risk of some cancers, like breast cancer. 12. Ditch the ice cream sundae in favor of a [place] yogurt parfait. Instead of a cup of ice cream or fro-yo for dessert, try 1 cup of nonfat Greek yogurt topped with fresh berries and a sprinkle of cacao nibs. Both toppings are packed with antioxidants, which can help reduce cellular inflammation and oxidative damage. And the comparison is a no-brainer: One cup of ice cream has about 275 calories; one cup of frozen yogurt has about 230; and a cup of Greek yogurt has just 130, plus twice the protein, so you're less likely to return to the freezer for a second helping. 13. Put olive oil in a spray container instead of using it directly from the bottle. Each tablespoon of olive oil is 120 calories and 15g of fat. Use a mister instead of pouring it straight into the pan or onto a salad. This allows for portion control and will save you more than 100 calories. 14. When baking, substitute canned pumpkin for butter or oil. Canned pumpkin-not pumpkin pie mix-is loaded with Vitamin A, which is important for skin and eye health, as well as immunity. And the comparisons are pretty crazy:  cup of canned pumpkin has about 40 calories, compared to butter or oil, which has more than 800 calories. Yes, 800 calories. Applesauce and mashed banana can also serve as good substitutions for butter or oil, usually in a 1:1 ratio. 15. Top casseroles with high-fiber cereal instead of breadcrumbs. Breadcrumbs are typically made with white bread, while breakfast cereals contain 5-9g of fiber per serving. Not only will you save more than 150 calories per  cup serving, the swap will also keep you more full and you'll get a metabolism boost from the added fiber. 16. Snack on pistachios instead of macadamia nuts. Believe it or not, you get the same amount of calories from 35  pistachios (100  calories) as you would from only five macadamia nuts. 17. Chow down on kale chips rather than potato chips. This is my favorite 'don't knock it 'till you try it' swap. Kale chips are so easy to make at home, and you can spice them up with a little grated parmesan or chili powder. Plus, they're a mere fraction of the calories of potato chips, but with the same crunch factor we crave so often. 18. Add seltzer and some fruit slices to your cocktail instead of soda or fruit juice. One cup of soda or fruit juice can pack on as much as 140 calories. Instead, use seltzer and fruit slices. The fruit provides valuable phytochemicals, such as flavonoids and anthocyanins, which help to combat cancer and stave off the aging process.  Phentermine  While taking the medication we may ask that you come into the office once a month or once every 2-3 months to monitor your weight, blood pressure, and heart rate. In addition we can help answer your questions about diet, exercise, and help you every step of the way with your weight loss journey. Sometime it is helpful if you bring in a food diary or use an app on your phone such as myfitnesspal to record your calorie intake, especially in the beginning.   You can start out on 1/3 to 1/2 a pill in the morning and if you are tolerating it well you can increase to one pill daily. I also have some patients that take 1/3 or 1/2 at lunch to help prevent night time eating.  This medication is cheapest CASH pay at [facility] and you do NOT need a membership to get meds from there.    What is this medicine? PHENTERMINE (FEN ter meen) decreases your appetite. This medicine is intended to be used in addition to a healthy reduced calorie diet and exercise. The best results are achieved this way. This medicine is only indicated for short-term use. Eventually your weight loss may level out and the medication will no longer be needed.   How should I use this medicine? Take this  medicine by mouth. Follow the directions on the prescription label. The tablets should stay in the bottle until immediately before you take your dose. Take your doses at regular intervals. Do not take your medicine more often than directed.  Overdosage: If you think you have taken too much of this medicine contact a poison control center or emergency room at once. NOTE: This medicine is only for you. Do not share this medicine with others.  What if I miss a dose? If you miss a dose, take it as soon as you can. If it is almost time for your next dose, take only that dose. Do not take double or extra doses. Do not increase or in any way change your dose without consulting your doctor.  What should I watch for while using this medicine? Notify your physician immediately if you become short of breath while doing your normal activities. Do not take this medicine within 6 hours of bedtime. It can keep you from getting to sleep. Avoid drinks that contain caffeine and try to stick to a regular bedtime every night. Do not stand or sit up quickly, especially if you are an older patient. This reduces the risk of dizzy or fainting spells. Avoid alcoholic drinks.  What side effects may I notice from receiving this medicine? Side effects that you should report to your doctor or health  care professional as soon as possible: -chest pain, palpitations -depression or severe changes in mood -increased blood pressure -irritability -nervousness or restlessness -severe dizziness -shortness of breath -problems urinating -unusual swelling of the legs -vomiting  Side effects that usually do not require medical attention (report to your doctor or health care professional if they continue or are bothersome): -blurred vision or other eye problems -changes in sexual ability or desire -constipation or diarrhea -difficulty sleeping -dry mouth or unpleasant taste -headache -nausea This list may not describe all  possible side effects. Call your doctor for medical advice about side effects. You may report side effects to FDA at 1-800-FDA-1088.

## 2014-08-06 LAB — BASIC METABOLIC PANEL WITH GFR
BUN: 11 mg/dL (ref 6–23)
CALCIUM: 9.5 mg/dL (ref 8.4–10.5)
CO2: 24 meq/L (ref 19–32)
CREATININE: 0.73 mg/dL (ref 0.50–1.10)
Chloride: 103 mEq/L (ref 96–112)
GFR, Est African American: >89 mL/min
GFR, Est Non African American: >89 mL/min
GLUCOSE: 88 mg/dL (ref 70–99)
Potassium: 4.3 mEq/L (ref 3.5–5.3)
Sodium: 137 mEq/L (ref 135–145)

## 2014-08-06 LAB — HEPATIC FUNCTION PANEL
ALK PHOS: 49 U/L (ref 39–117)
ALT: 25 U/L (ref 0–35)
AST: 17 U/L (ref 0–37)
Albumin: 4.6 g/dL (ref 3.5–5.2)
BILIRUBIN DIRECT: 0.1 mg/dL (ref 0.0–0.3)
BILIRUBIN INDIRECT: 0.5 mg/dL (ref 0.2–1.2)
Total Bilirubin: 0.6 mg/dL (ref 0.2–1.2)
Total Protein: 7.2 g/dL (ref 6.0–8.3)

## 2014-08-06 LAB — LIPID PANEL
CHOL/HDL RATIO: 4 ratio
CHOLESTEROL: 189 mg/dL (ref 0–200)
HDL: 47 mg/dL (ref >39)
LDL Cholesterol: 109 mg/dL — ABNORMAL HIGH (ref 0–99)
TRIGLYCERIDES: 164 mg/dL — AB (ref <150)
VLDL: 33 mg/dL (ref 0–40)

## 2014-08-06 LAB — MAGNESIUM: Magnesium: 2.1 mg/dL (ref 1.5–2.5)

## 2014-08-06 LAB — TSH: TSH: 2.079 u[IU]/mL (ref 0.350–4.500)

## 2014-09-04 ENCOUNTER — Other Ambulatory Visit: Payer: Self-pay | Admitting: Physician Assistant

## 2014-09-04 DIAGNOSIS — F988 Other specified behavioral and emotional disorders with onset usually occurring in childhood and adolescence: Secondary | ICD-10-CM

## 2014-09-04 MED ORDER — AMPHETAMINE-DEXTROAMPHETAMINE 20 MG PO TABS: 20.0000 mg | ORAL_TABLET | Freq: Two times a day (BID) | ORAL | Status: DC

## 2014-10-06 ENCOUNTER — Other Ambulatory Visit: Payer: Self-pay | Admitting: Internal Medicine

## 2014-10-06 DIAGNOSIS — F988 Other specified behavioral and emotional disorders with onset usually occurring in childhood and adolescence: Secondary | ICD-10-CM

## 2014-10-06 MED ORDER — AMPHETAMINE-DEXTROAMPHETAMINE 20 MG PO TABS: 20.0000 mg | ORAL_TABLET | Freq: Two times a day (BID) | ORAL | Status: DC

## 2014-11-06 ENCOUNTER — Other Ambulatory Visit: Payer: Self-pay | Admitting: Internal Medicine

## 2014-11-06 DIAGNOSIS — F988 Other specified behavioral and emotional disorders with onset usually occurring in childhood and adolescence: Secondary | ICD-10-CM

## 2014-11-06 MED ORDER — AMPHETAMINE-DEXTROAMPHETAMINE 20 MG PO TABS: 20.0000 mg | ORAL_TABLET | Freq: Two times a day (BID) | ORAL | Status: DC

## 2014-12-09 ENCOUNTER — Other Ambulatory Visit: Payer: Self-pay | Admitting: Physician Assistant

## 2014-12-09 DIAGNOSIS — F988 Other specified behavioral and emotional disorders with onset usually occurring in childhood and adolescence: Secondary | ICD-10-CM

## 2014-12-09 MED ORDER — AMPHETAMINE-DEXTROAMPHETAMINE 20 MG PO TABS: 20.0000 mg | ORAL_TABLET | Freq: Two times a day (BID) | ORAL | Status: DC

## 2015-01-12 ENCOUNTER — Telehealth: Payer: Self-pay | Admitting: *Deleted

## 2015-01-12 ENCOUNTER — Other Ambulatory Visit: Payer: Self-pay | Admitting: Physician Assistant

## 2015-01-12 NOTE — Telephone Encounter (Signed)
Can get at her office visit tomorrow.

## 2015-01-12 NOTE — Telephone Encounter (Signed)
REFILL =  ADDERALL

## 2015-01-13 ENCOUNTER — Ambulatory Visit (INDEPENDENT_AMBULATORY_CARE_PROVIDER_SITE_OTHER): Payer: 59 | Admitting: Internal Medicine

## 2015-01-13 ENCOUNTER — Encounter: Payer: Self-pay | Admitting: Internal Medicine

## 2015-01-13 VITALS — BP 118/64 | HR 84 | Temp 98.2°F | Resp 18 | Ht 65.5 in | Wt 164.0 lb

## 2015-01-13 DIAGNOSIS — F909 Attention-deficit hyperactivity disorder, unspecified type: Secondary | ICD-10-CM

## 2015-01-13 DIAGNOSIS — F988 Other specified behavioral and emotional disorders with onset usually occurring in childhood and adolescence: Secondary | ICD-10-CM

## 2015-01-13 DIAGNOSIS — R300 Dysuria: Secondary | ICD-10-CM

## 2015-01-13 MED ORDER — NITROFURANTOIN MONOHYD MACRO 100 MG PO CAPS: 100.0000 mg | ORAL_CAPSULE | Freq: Two times a day (BID) | ORAL | Status: DC

## 2015-01-13 MED ORDER — AMPHETAMINE-DEXTROAMPHETAMINE 20 MG PO TABS: 20.0000 mg | ORAL_TABLET | Freq: Two times a day (BID) | ORAL | Status: DC

## 2015-01-13 NOTE — Patient Instructions (Signed)

## 2015-01-13 NOTE — Progress Notes (Signed)
   Subjective:    Patient ID: Tonya [name], female    DOB: 1985/09/17, 29 y.o.   MRN: [id number]  Dysuria  Associated symptoms include frequency and urgency. Pertinent negatives include no chills, hematuria, nausea or vomiting.   Patient presents to the office for evaluation of Dysuria since the fourth of July weekend.  She reports that the dysuria is intermittent and not as bad as it was.  She did take sulfamethaxazole did not help at all.  She reports that she has had some frequency and urgency which is not as bad.  She reports no fevers or chills.  No nausea vomiting or abdominal pain.  No vaginal discharge.  She reports that her periods have been very irregular but she is also going back on Loloestrin.  She does not usually have frequent urinary tract infections.      Review of Systems  Constitutional: Negative for fever, chills and fatigue.  Gastrointestinal: Negative for nausea, vomiting and abdominal pain.  Genitourinary: Positive for dysuria, urgency and frequency. Negative for hematuria, vaginal bleeding and vaginal discharge.       Objective:   Physical Exam  Constitutional: She is oriented to person, place, and time. She appears well-developed and well-nourished. No distress.  HENT:  Head: Normocephalic.  Mouth/Throat: Oropharynx is clear and moist. No oropharyngeal exudate.  Eyes: Conjunctivae are normal. No scleral icterus.  Neck: Normal range of motion. Neck supple. No JVD present. No thyromegaly present.  Cardiovascular: Normal rate, regular rhythm, normal heart sounds and intact distal pulses.  Exam reveals no gallop and no friction rub.   No murmur heard. Pulmonary/Chest: Effort normal and breath sounds normal. No respiratory distress. She has no wheezes. She has no rales. She exhibits no tenderness.  Abdominal: Soft. Bowel sounds are normal. She exhibits no distension and no mass. There is no tenderness. There is no rigidity, no rebound, no guarding, no CVA  tenderness, no tenderness at McBurney's point and negative Murphy's sign.  Musculoskeletal: Normal range of motion.  Lymphadenopathy:    She has no cervical adenopathy.  Neurological: She is alert and oriented to person, place, and time.  Skin: Skin is warm and dry. She is not diaphoretic.  Psychiatric: She has a normal mood and affect. Her behavior is normal. Judgment and thought content normal.  Nursing note and vitals reviewed.         Assessment & Plan:    1. Dysuria -macrobid - Urinalysis, Routine w reflex microscopic (not at [facility]) - Urine culture

## 2015-01-14 LAB — URINALYSIS, ROUTINE W REFLEX MICROSCOPIC
BILIRUBIN URINE: NEGATIVE
Glucose, UA: NEGATIVE mg/dL
HGB URINE DIPSTICK: NEGATIVE
KETONES UR: NEGATIVE mg/dL
NITRITE: POSITIVE — AB
Protein, ur: NEGATIVE mg/dL
SPECIFIC GRAVITY, URINE: 1.024 (ref 1.005–1.030)
UROBILINOGEN UA: 0.2 mg/dL (ref 0.0–1.0)
pH: 5 (ref 5.0–8.0)

## 2015-01-14 LAB — URINALYSIS, MICROSCOPIC ONLY
CASTS: NONE SEEN
Crystals: NONE SEEN

## 2015-01-15 LAB — URINE CULTURE

## 2015-02-17 ENCOUNTER — Other Ambulatory Visit: Payer: Self-pay | Admitting: Internal Medicine

## 2015-02-17 DIAGNOSIS — F988 Other specified behavioral and emotional disorders with onset usually occurring in childhood and adolescence: Secondary | ICD-10-CM

## 2015-02-17 MED ORDER — AMPHETAMINE-DEXTROAMPHETAMINE 20 MG PO TABS: 20.0000 mg | ORAL_TABLET | Freq: Two times a day (BID) | ORAL | Status: DC

## 2015-02-18 ENCOUNTER — Other Ambulatory Visit: Payer: 59

## 2015-02-18 DIAGNOSIS — N3000 Acute cystitis without hematuria: Secondary | ICD-10-CM

## 2015-02-19 LAB — URINALYSIS, ROUTINE W REFLEX MICROSCOPIC
BILIRUBIN URINE: NEGATIVE
GLUCOSE, UA: NEGATIVE
Hgb urine dipstick: NEGATIVE
Ketones, ur: NEGATIVE
Leukocytes, UA: NEGATIVE
Nitrite: NEGATIVE
PH: 6 (ref 5.0–8.0)
PROTEIN: NEGATIVE
SPECIFIC GRAVITY, URINE: 1.024 (ref 1.001–1.035)

## 2015-02-19 LAB — URINE CULTURE: Colony Count: 4000

## 2015-03-17 ENCOUNTER — Encounter: Payer: Self-pay | Admitting: Internal Medicine

## 2015-03-23 ENCOUNTER — Other Ambulatory Visit: Payer: Self-pay | Admitting: Internal Medicine

## 2015-03-23 DIAGNOSIS — F988 Other specified behavioral and emotional disorders with onset usually occurring in childhood and adolescence: Secondary | ICD-10-CM

## 2015-03-23 MED ORDER — AMPHETAMINE-DEXTROAMPHETAMINE 20 MG PO TABS: 20.0000 mg | ORAL_TABLET | Freq: Two times a day (BID) | ORAL | Status: DC

## 2015-04-01 ENCOUNTER — Ambulatory Visit (INDEPENDENT_AMBULATORY_CARE_PROVIDER_SITE_OTHER): Payer: 59 | Admitting: Internal Medicine

## 2015-04-01 ENCOUNTER — Encounter: Payer: Self-pay | Admitting: Internal Medicine

## 2015-04-01 VITALS — BP 138/84 | HR 110 | Temp 98.0°F | Resp 16 | Ht 65.5 in | Wt 156.0 lb

## 2015-04-01 DIAGNOSIS — J069 Acute upper respiratory infection, unspecified: Secondary | ICD-10-CM

## 2015-04-01 MED ORDER — FLUCONAZOLE 150 MG PO TABS: 150.0000 mg | ORAL_TABLET | Freq: Once | ORAL | Status: DC

## 2015-04-01 MED ORDER — CIPROFLOXACIN HCL 500 MG PO TABS: 500.0000 mg | ORAL_TABLET | Freq: Two times a day (BID) | ORAL | Status: AC

## 2015-04-01 MED ORDER — PREDNISONE 20 MG PO TABS: ORAL_TABLET | ORAL | Status: DC

## 2015-04-01 MED ORDER — ONDANSETRON HCL 4 MG PO TABS: 4.0000 mg | ORAL_TABLET | Freq: Every day | ORAL | Status: DC | PRN

## 2015-04-01 MED ORDER — AZITHROMYCIN 250 MG PO TABS: ORAL_TABLET | ORAL | Status: DC

## 2015-04-01 NOTE — Progress Notes (Signed)
Patient ID: Tonya [name], female   DOB: Jun 15, 1986, 29 y.o.   MRN: [id number]  HPI  Patient presents to the office for evaluation of cough.  It has been going on for 5 days.  Patient reports minimal cough.  They also endorse postnasal drip and sinus congestion, sinus pressure, mild nasal bleeding, and green nasal sputum.  .  They have tried antihistamines or flonase.  They report that nothing has worked.  They denies other sick contacts.  She does tend to have bad seasonal allergies.    Review of Systems  Constitutional: Negative for fever and chills.  HENT: Positive for congestion. Negative for ear pain and sore throat.   Eyes: Negative.   Respiratory: Negative for cough, shortness of breath and wheezing.   Cardiovascular: Negative for chest pain, palpitations and leg swelling.  Neurological: Negative for headaches.    PE:  Filed Vitals:   04/01/15 1624  BP: 138/84  Pulse: 110  Temp: 98 F (36.7 C)  Resp: 16    General:  Alert and non-toxic, WDWN, NAD HEENT: NCAT, PERLA, EOM normal, no occular discharge or erythema.  Nasal mucosal edema with sinus tenderness to palpation.  Oropharynx clear with minimal oropharyngeal edema and erythema.  Mucous membranes moist and pink. Neck:  Cervical adenopathy Chest:  RRR no MRGs.  Lungs clear to auscultation A&P with no wheezes rhonchi or rales.   Abdomen: +BS x 4 quadrants, soft, non-tender, no guarding, rigidity, or rebound. Skin: warm and dry no rash Neuro: A&Ox4, CN II-XII grossly intact  Assessment and Plan:   1. Acute URI -prednisone -zpak -nasal saline -flonase -allegra

## 2015-04-23 ENCOUNTER — Other Ambulatory Visit: Payer: Self-pay | Admitting: *Deleted

## 2015-04-23 DIAGNOSIS — F988 Other specified behavioral and emotional disorders with onset usually occurring in childhood and adolescence: Secondary | ICD-10-CM

## 2015-04-23 MED ORDER — AMPHETAMINE-DEXTROAMPHETAMINE 20 MG PO TABS: 20.0000 mg | ORAL_TABLET | Freq: Two times a day (BID) | ORAL | Status: DC

## 2015-05-28 ENCOUNTER — Other Ambulatory Visit: Payer: Self-pay | Admitting: Internal Medicine

## 2015-05-28 DIAGNOSIS — F988 Other specified behavioral and emotional disorders with onset usually occurring in childhood and adolescence: Secondary | ICD-10-CM

## 2015-05-28 MED ORDER — AMPHETAMINE-DEXTROAMPHETAMINE 20 MG PO TABS: 20.0000 mg | ORAL_TABLET | Freq: Two times a day (BID) | ORAL | Status: DC

## 2015-05-31 ENCOUNTER — Ambulatory Visit (INDEPENDENT_AMBULATORY_CARE_PROVIDER_SITE_OTHER): Payer: 59 | Admitting: Internal Medicine

## 2015-05-31 ENCOUNTER — Encounter: Payer: Self-pay | Admitting: Internal Medicine

## 2015-05-31 VITALS — BP 116/80 | HR 76 | Temp 98.0°F | Resp 18 | Ht 65.5 in | Wt 163.0 lb

## 2015-05-31 DIAGNOSIS — Z136 Encounter for screening for cardiovascular disorders: Secondary | ICD-10-CM | POA: Diagnosis not present

## 2015-05-31 DIAGNOSIS — Z0001 Encounter for general adult medical examination with abnormal findings: Secondary | ICD-10-CM

## 2015-05-31 DIAGNOSIS — E785 Hyperlipidemia, unspecified: Secondary | ICD-10-CM

## 2015-05-31 DIAGNOSIS — Z0000 Encounter for general adult medical examination without abnormal findings: Secondary | ICD-10-CM

## 2015-05-31 DIAGNOSIS — Z1329 Encounter for screening for other suspected endocrine disorder: Secondary | ICD-10-CM

## 2015-05-31 DIAGNOSIS — Z131 Encounter for screening for diabetes mellitus: Secondary | ICD-10-CM

## 2015-05-31 DIAGNOSIS — D649 Anemia, unspecified: Secondary | ICD-10-CM

## 2015-05-31 DIAGNOSIS — Z1389 Encounter for screening for other disorder: Secondary | ICD-10-CM

## 2015-05-31 DIAGNOSIS — Z79899 Other long term (current) drug therapy: Secondary | ICD-10-CM

## 2015-05-31 DIAGNOSIS — F419 Anxiety disorder, unspecified: Secondary | ICD-10-CM

## 2015-05-31 DIAGNOSIS — F988 Other specified behavioral and emotional disorders with onset usually occurring in childhood and adolescence: Secondary | ICD-10-CM

## 2015-05-31 DIAGNOSIS — E559 Vitamin D deficiency, unspecified: Secondary | ICD-10-CM

## 2015-05-31 LAB — IRON AND TIBC
%SAT: 18 % (ref 11–50)
Iron: 68 ug/dL (ref 40–190)
TIBC: 368 ug/dL (ref 250–450)
UIBC: 300 ug/dL (ref 125–400)

## 2015-05-31 LAB — CBC WITH DIFFERENTIAL/PLATELET
BASOS ABS: 0 10*3/uL (ref 0.0–0.1)
BASOS PCT: 1 % (ref 0–1)
EOS ABS: 0.1 10*3/uL (ref 0.0–0.7)
EOS PCT: 3 % (ref 0–5)
HCT: 40 % (ref 36.0–46.0)
Hemoglobin: 13.2 g/dL (ref 12.0–15.0)
LYMPHS ABS: 2 10*3/uL (ref 0.7–4.0)
Lymphocytes Relative: 43 % (ref 12–46)
MCH: 30.1 pg (ref 26.0–34.0)
MCHC: 33 g/dL (ref 30.0–36.0)
MCV: 91.3 fL (ref 78.0–100.0)
MPV: 9.3 fL (ref 8.6–12.4)
Monocytes Absolute: 0.3 10*3/uL (ref 0.1–1.0)
Monocytes Relative: 6 % (ref 3–12)
NEUTROS PCT: 47 % (ref 43–77)
Neutro Abs: 2.2 10*3/uL (ref 1.7–7.7)
PLATELETS: 194 10*3/uL (ref 150–400)
RBC: 4.38 MIL/uL (ref 3.87–5.11)
RDW: 12.9 % (ref 11.5–15.5)
WBC: 4.7 10*3/uL (ref 4.0–10.5)

## 2015-05-31 LAB — TSH: TSH: 3.285 u[IU]/mL (ref 0.350–4.500)

## 2015-05-31 LAB — HEPATIC FUNCTION PANEL
ALBUMIN: 4.3 g/dL (ref 3.6–5.1)
ALK PHOS: 43 U/L (ref 33–115)
ALT: 23 U/L (ref 6–29)
AST: 17 U/L (ref 10–30)
BILIRUBIN DIRECT: 0.1 mg/dL (ref <=0.2)
BILIRUBIN TOTAL: 0.4 mg/dL (ref 0.2–1.2)
Indirect Bilirubin: 0.3 mg/dL (ref 0.2–1.2)
Total Protein: 6.4 g/dL (ref 6.1–8.1)

## 2015-05-31 LAB — LIPID PANEL
CHOL/HDL RATIO: 4.4 ratio (ref <=5.0)
Cholesterol: 184 mg/dL (ref 125–200)
HDL: 42 mg/dL — AB (ref >=46)
LDL CALC: 109 mg/dL (ref <130)
TRIGLYCERIDES: 165 mg/dL — AB (ref <150)
VLDL: 33 mg/dL — ABNORMAL HIGH (ref <30)

## 2015-05-31 LAB — BASIC METABOLIC PANEL WITH GFR
BUN: 15 mg/dL (ref 7–25)
CHLORIDE: 105 mmol/L (ref 98–110)
CO2: 25 mmol/L (ref 20–31)
Calcium: 9 mg/dL (ref 8.6–10.2)
Creat: 0.85 mg/dL (ref 0.50–1.10)
GFR, Est Non African American: >89 mL/min (ref >=60)
Glucose, Bld: 76 mg/dL (ref 65–99)
POTASSIUM: 4 mmol/L (ref 3.5–5.3)
SODIUM: 138 mmol/L (ref 135–146)

## 2015-05-31 LAB — HEMOGLOBIN A1C
HEMOGLOBIN A1C: 5.4 % (ref <5.7)
Mean Plasma Glucose: 108 mg/dL (ref <117)

## 2015-05-31 LAB — VITAMIN B12: Vitamin B-12: 370 pg/mL (ref 211–911)

## 2015-05-31 LAB — MAGNESIUM: MAGNESIUM: 1.9 mg/dL (ref 1.5–2.5)

## 2015-05-31 NOTE — Patient Instructions (Signed)
Preventive Care for Adults A healthy lifestyle and preventive care can promote health and wellness. Preventive health guidelines for women include the following key practices.  A routine yearly physical is a good way to check with your health care provider about your health and preventive screening. It is a chance to share any concerns and updates on your health and to receive a thorough exam.  Visit your dentist for a routine exam and preventive care every 6 months. Brush your teeth twice a day and floss once a day. Good oral hygiene prevents tooth decay and gum disease.  The frequency of eye exams is based on your age, health, family medical history, use of contact lenses, and other factors. Follow your health care provider's recommendations for frequency of eye exams.  Eat a healthy diet. Foods like vegetables, fruits, whole grains, low-fat dairy products, and lean protein foods contain the nutrients you need without too many calories. Decrease your intake of foods high in solid fats, added sugars, and salt. Eat the right amount of calories for you.Get information about a proper diet from your health care provider, if necessary.  Regular physical exercise is one of the most important things you can do for your health. Most adults should get at least 150 minutes of moderate-intensity exercise (any activity that increases your heart rate and causes you to sweat) each week. In addition, most adults need muscle-strengthening exercises on 2 or more days a week.  Maintain a healthy weight. The body mass index (BMI) is a screening tool to identify possible weight problems. It provides an estimate of body fat based on height and weight. Your health care provider can find your BMI and can help you achieve or maintain a healthy weight.For adults 20 years and older:  A BMI below 18.5 is considered underweight.  A BMI of 18.5 to 24.9 is normal.  A BMI of 25 to 29.9 is considered overweight.  A BMI of  30 and above is considered obese.  Maintain normal blood lipids and cholesterol levels by exercising and minimizing your intake of saturated fat. Eat a balanced diet with plenty of fruit and vegetables. Blood tests for lipids and cholesterol should begin at age 20 and be repeated every 5 years. If your lipid or cholesterol levels are high, you are over 50, or you are at high risk for heart disease, you may need your cholesterol levels checked more frequently.Ongoing high lipid and cholesterol levels should be treated with medicines if diet and exercise are not working.  If you smoke, find out from your health care provider how to quit. If you do not use tobacco, do not start.  Lung cancer screening is recommended for adults aged 55-80 years who are at high risk for developing lung cancer because of a history of smoking. A yearly low-dose CT scan of the lungs is recommended for people who have at least a 30-pack-year history of smoking and are a current smoker or have quit within the past 15 years. A pack year of smoking is smoking an average of 1 pack of cigarettes a day for 1 year (for example: 1 pack a day for 30 years or 2 packs a day for 15 years). Yearly screening should continue until the smoker has stopped smoking for at least 15 years. Yearly screening should be stopped for people who develop a health problem that would prevent them from having lung cancer treatment.  If you are pregnant, do not drink alcohol. If you are breastfeeding,   breastfeeding, be very cautious about drinking alcohol. If you are not pregnant and choose to drink alcohol, do not have more than 1 drink per day. One drink is considered to be 12 ounces (355 mL) of beer, 5 ounces (148 mL) of wine, or 1.5 ounces (44 mL) of liquor.  Avoid use of street drugs. Do not share needles with anyone. Ask for help if you need support or instructions about stopping the use of drugs.  High blood pressure causes heart disease and increases the risk of  stroke. Your blood pressure should be checked at least every 1 to 2 years. Ongoing high blood pressure should be treated with medicines if weight loss and exercise do not work.  If you are 3-31 years old, ask your health care provider if you should take aspirin to prevent strokes.  Diabetes screening involves taking a blood sample to check your fasting blood sugar level. This should be done once every 3 years, after age 31, if you are within normal weight and without risk factors for diabetes. Testing should be considered at a younger age or be carried out more frequently if you are overweight and have at least 1 risk factor for diabetes.  Breast cancer screening is essential preventive care for women. You should practice "breast self-awareness." This means understanding the normal appearance and feel of your breasts and may include breast self-examination. Any changes detected, no matter how small, should be reported to a health care provider. Women in their 76s and 30s should have a clinical breast exam (CBE) by a health care provider as part of a regular health exam every 1 to 3 years. After age 65, women should have a CBE every year. Starting at age 67, women should consider having a mammogram (breast X-ray test) every year. Women who have a family history of breast cancer should talk to their health care provider about genetic screening. Women at a high risk of breast cancer should talk to their health care providers about having an MRI and a mammogram every year.  Breast cancer gene (BRCA)-related cancer risk assessment is recommended for women who have family members with BRCA-related cancers. BRCA-related cancers include breast, ovarian, tubal, and peritoneal cancers. Having family members with these cancers may be associated with an increased risk for harmful changes (mutations) in the breast cancer genes BRCA1 and BRCA2. Results of the assessment will determine the need for genetic counseling and  BRCA1 and BRCA2 testing.  Routine pelvic exams to screen for cancer are no longer recommended for nonpregnant women who are considered low risk for cancer of the pelvic organs (ovaries, uterus, and vagina) and who do not have symptoms. Ask your health care provider if a screening pelvic exam is right for you.  If you have had past treatment for cervical cancer or a condition that could lead to cancer, you need Pap tests and screening for cancer for at least 20 years after your treatment. If Pap tests have been discontinued, your risk factors (such as having a new sexual partner) need to be reassessed to determine if screening should be resumed. Some women have medical problems that increase the chance of getting cervical cancer. In these cases, your health care provider may recommend more frequent screening and Pap tests.  The HPV test is an additional test that may be used for cervical cancer screening. The HPV test looks for the virus that can cause the cell changes on the cervix. The cells collected during the Pap test can  be tested for HPV. The HPV test could be used to screen women aged 98 years and older, and should be used in women of any age who have unclear Pap test results. After the age of 59, women should have HPV testing at the same frequency as a Pap test.  Colorectal cancer can be detected and often prevented. Most routine colorectal cancer screening begins at the age of 35 years and continues through age 61 years. However, your health care provider may recommend screening at an earlier age if you have risk factors for colon cancer. On a yearly basis, your health care provider may provide home test kits to check for hidden blood in the stool. Use of a small camera at the end of a tube, to directly examine the colon (sigmoidoscopy or colonoscopy), can detect the earliest forms of colorectal cancer. Talk to your health care provider about this at age 18, when routine screening begins. Direct  exam of the colon should be repeated every 5-10 years through age 67 years, unless early forms of pre-cancerous polyps or small growths are found.  People who are at an increased risk for hepatitis B should be screened for this virus. You are considered at high risk for hepatitis B if:  You were born in a country where hepatitis B occurs often. Talk with your health care provider about which countries are considered high risk.  Your parents were born in a high-risk country and you have not received a shot to protect against hepatitis B (hepatitis B vaccine).  You have HIV or AIDS.  You use needles to inject street drugs.  You live with, or have sex with, someone who has hepatitis B.  You get hemodialysis treatment.  You take certain medicines for conditions like cancer, organ transplantation, and autoimmune conditions.  Hepatitis C blood testing is recommended for all people born from [birth year] through 1965 and any individual with known risks for hepatitis C.  Practice safe sex. Use condoms and avoid high-risk sexual practices to reduce the spread of sexually transmitted infections (STIs). STIs include gonorrhea, chlamydia, syphilis, trichomonas, herpes, HPV, and human immunodeficiency virus (HIV). Herpes, HIV, and HPV are viral illnesses that have no cure. They can result in disability, cancer, and death.  You should be screened for sexually transmitted illnesses (STIs) including gonorrhea and chlamydia if:  You are sexually active and are younger than 24 years.  You are older than 24 years and your health care provider tells you that you are at risk for this type of infection.  Your sexual activity has changed since you were last screened and you are at an increased risk for chlamydia or gonorrhea. Ask your health care provider if you are at risk.  If you are at risk of being infected with HIV, it is recommended that you take a prescription medicine daily to prevent HIV infection. This is  called preexposure prophylaxis (PrEP). You are considered at risk if:  You are a heterosexual woman, are sexually active, and are at increased risk for HIV infection.  You take drugs by injection.  You are sexually active with a partner who has HIV.  Talk with your health care provider about whether you are at high risk of being infected with HIV. If you choose to begin PrEP, you should first be tested for HIV. You should then be tested every 3 months for as long as you are taking PrEP.  Osteoporosis is a disease in which the bones lose minerals and  strength with aging. This can result in serious bone fractures or breaks. The risk of osteoporosis can be identified using a bone density scan. Women ages 48 years and over and women at risk for fractures or osteoporosis should discuss screening with their health care providers. Ask your health care provider whether you should take a calcium supplement or vitamin D to reduce the rate of osteoporosis.  Menopause can be associated with physical symptoms and risks. Hormone replacement therapy is available to decrease symptoms and risks. You should talk to your health care provider about whether hormone replacement therapy is right for you.  Use sunscreen. Apply sunscreen liberally and repeatedly throughout the day. You should seek shade when your shadow is shorter than you. Protect yourself by wearing long sleeves, pants, a wide-brimmed hat, and sunglasses year round, whenever you are outdoors.  Once a month, do a whole body skin exam, using a mirror to look at the skin on your back. Tell your health care provider of new moles, moles that have irregular borders, moles that are larger than a pencil eraser, or moles that have changed in shape or color.  Stay current with required vaccines (immunizations).  Influenza vaccine. All adults should be immunized every year.  Tetanus, diphtheria, and acellular pertussis (Td, Tdap) vaccine. Pregnant women should  receive 1 dose of Tdap vaccine during each pregnancy. The dose should be obtained regardless of the length of time since the last dose. Immunization is preferred during the 27th-36th week of gestation. An adult who has not previously received Tdap or who does not know her vaccine status should receive 1 dose of Tdap. This initial dose should be followed by tetanus and diphtheria toxoids (Td) booster doses every 10 years. Adults with an unknown or incomplete history of completing a 3-dose immunization series with Td-containing vaccines should begin or complete a primary immunization series including a Tdap dose. Adults should receive a Td booster every 10 years.  Varicella vaccine. An adult without evidence of immunity to varicella should receive 2 doses or a second dose if she has previously received 1 dose. Pregnant females who do not have evidence of immunity should receive the first dose after pregnancy. This first dose should be obtained before leaving the health care facility. The second dose should be obtained 4-8 weeks after the first dose.  Human papillomavirus (HPV) vaccine. Females aged 13-26 years who have not received the vaccine previously should obtain the 3-dose series. The vaccine is not recommended for use in pregnant females. However, pregnancy testing is not needed before receiving a dose. If a female is found to be pregnant after receiving a dose, no treatment is needed. In that case, the remaining doses should be delayed until after the pregnancy. Immunization is recommended for any person with an immunocompromised condition through the age of 48 years if she did not get any or all doses earlier. During the 3-dose series, the second dose should be obtained 4-8 weeks after the first dose. The third dose should be obtained 24 weeks after the first dose and 16 weeks after the second dose.  Zoster vaccine. One dose is recommended for adults aged 67 years or older unless certain conditions are  present.  Measles, mumps, and rubella (MMR) vaccine. Adults born before [birth year] generally are considered immune to measles and mumps. Adults born in [birth year] or later should have 1 or more doses of MMR vaccine unless there is a contraindication to the vaccine or there is laboratory evidence of immunity  to each of the three diseases. A routine second dose of MMR vaccine should be obtained at least 28 days after the first dose for students attending postsecondary schools, health care workers, or international travelers. People who received inactivated measles vaccine or an unknown type of measles vaccine during 1963-1967 should receive 2 doses of MMR vaccine. People who received inactivated mumps vaccine or an unknown type of mumps vaccine before 1979 and are at high risk for mumps infection should consider immunization with 2 doses of MMR vaccine. For females of childbearing age, rubella immunity should be determined. If there is no evidence of immunity, females who are not pregnant should be vaccinated. If there is no evidence of immunity, females who are pregnant should delay immunization until after pregnancy. Unvaccinated health care workers born before [birth year] who lack laboratory evidence of measles, mumps, or rubella immunity or laboratory confirmation of disease should consider measles and mumps immunization with 2 doses of MMR vaccine or rubella immunization with 1 dose of MMR vaccine.  Pneumococcal 13-valent conjugate (PCV13) vaccine. When indicated, a person who is uncertain of her immunization history and has no record of immunization should receive the PCV13 vaccine. An adult aged 58 years or older who has certain medical conditions and has not been previously immunized should receive 1 dose of PCV13 vaccine. This PCV13 should be followed with a dose of pneumococcal polysaccharide (PPSV23) vaccine. The PPSV23 vaccine dose should be obtained at least 8 weeks after the dose of PCV13 vaccine. An adult aged 65  years or older who has certain medical conditions and previously received 1 or more doses of PPSV23 vaccine should receive 1 dose of PCV13. The PCV13 vaccine dose should be obtained 1 or more years after the last PPSV23 vaccine dose.  Pneumococcal polysaccharide (PPSV23) vaccine. When PCV13 is also indicated, PCV13 should be obtained first. All adults aged 18 years and older should be immunized. An adult younger than age 74 years who has certain medical conditions should be immunized. Any person who resides in a nursing home or long-term care facility should be immunized. An adult smoker should be immunized. People with an immunocompromised condition and certain other conditions should receive both PCV13 and PPSV23 vaccines. People with human immunodeficiency virus (HIV) infection should be immunized as soon as possible after diagnosis. Immunization during chemotherapy or radiation therapy should be avoided. Routine use of PPSV23 vaccine is not recommended for American Indians, [place] Natives, or people younger than 65 years unless there are medical conditions that require PPSV23 vaccine. When indicated, people who have unknown immunization and have no record of immunization should receive PPSV23 vaccine. One-time revaccination 5 years after the first dose of PPSV23 is recommended for people aged 19-64 years who have chronic kidney failure, nephrotic syndrome, asplenia, or immunocompromised conditions. People who received 1-2 doses of PPSV23 before age 21 years should receive another dose of PPSV23 vaccine at age 42 years or later if at least 5 years have passed since the previous dose. Doses of PPSV23 are not needed for people immunized with PPSV23 at or after age 61 years.  Meningococcal vaccine. Adults with asplenia or persistent complement component deficiencies should receive 2 doses of quadrivalent meningococcal conjugate (MenACWY-D) vaccine. The doses should be obtained at least 2 months apart.  Microbiologists working with certain meningococcal bacteria, [employer] recruits, people at risk during an outbreak, and people who travel to or live in countries with a high rate of meningitis should be immunized. A first-year college student up through  age 23 years who is living in a residence hall should receive a dose if she did not receive a dose on or after her 16th birthday. Adults who have certain high-risk conditions should receive one or more doses of vaccine.  Hepatitis A vaccine. Adults who wish to be protected from this disease, have certain high-risk conditions, work with hepatitis A-infected animals, work in hepatitis A research labs, or travel to or work in countries with a high rate of hepatitis A should be immunized. Adults who were previously unvaccinated and who anticipate close contact with an international adoptee during the first 60 days after arrival in the [place] from a country with a high rate of hepatitis A should be immunized.  Hepatitis B vaccine. Adults who wish to be protected from this disease, have certain high-risk conditions, may be exposed to blood or other infectious body fluids, are household contacts or sex partners of hepatitis B positive people, are clients or workers in certain care facilities, or travel to or work in countries with a high rate of hepatitis B should be immunized.  Haemophilus influenzae type b (Hib) vaccine. A previously unvaccinated person with asplenia or sickle cell disease or having a scheduled splenectomy should receive 1 dose of Hib vaccine. Regardless of previous immunization, a recipient of a hematopoietic stem cell transplant should receive a 3-dose series 6-12 months after her successful transplant. Hib vaccine is not recommended for adults with HIV infection. Preventive Services / Frequency  Ages 65 to 22 years  Blood pressure check.  Lipid and cholesterol check.  Clinical breast exam.** / Every 3 years for women in their 53s  and 35s.  BRCA-related cancer risk assessment.** / For women who have family members with a BRCA-related cancer (breast, ovarian, tubal, or peritoneal cancers).  Pap test.** / Every 2 years from ages 41 through 44. Every 3 years starting at age 64 through age 28 or 21 with a history of 3 consecutive normal Pap tests.  HPV screening.** / Every 3 years from ages 9 through ages 56 to 48 with a history of 3 consecutive normal Pap tests.  Hepatitis C blood test.** / For any individual with known risks for hepatitis C.  Skin self-exam. / Monthly.  Influenza vaccine. / Every year.  Tetanus, diphtheria, and acellular pertussis (Tdap, Td) vaccine.** / Consult your health care provider. Pregnant women should receive 1 dose of Tdap vaccine during each pregnancy. 1 dose of Td every 10 years.  Varicella vaccine.** / Consult your health care provider. Pregnant females who do not have evidence of immunity should receive the first dose after pregnancy.  HPV vaccine. / 3 doses over 6 months, if 67 and younger. The vaccine is not recommended for use in pregnant females. However, pregnancy testing is not needed before receiving a dose.  Measles, mumps, rubella (MMR) vaccine.** / You need at least 1 dose of MMR if you were born in 1957 or later. You may also need a 2nd dose. For females of childbearing age, rubella immunity should be determined. If there is no evidence of immunity, females who are not pregnant should be vaccinated. If there is no evidence of immunity, females who are pregnant should delay immunization until after pregnancy.  Pneumococcal 13-valent conjugate (PCV13) vaccine.** / Consult your health care provider.  Pneumococcal polysaccharide (PPSV23) vaccine.** / 1 to 2 doses if you smoke cigarettes or if you have certain conditions.  Meningococcal vaccine.** / 1 dose if you are age 61 to 21 years and  first-year college student living in a residence hall, or have one of several medical  conditions, you need to get vaccinated against meningococcal disease. You may also need additional booster doses.  Hepatitis A vaccine.** / Consult your health care provider.  Hepatitis B vaccine.** / Consult your health care provider.  Haemophilus influenzae type b (Hib) vaccine.** / Consult your health care provider.   

## 2015-05-31 NOTE — Progress Notes (Signed)
Patient ID: Tonya [name], female   DOB: 01-09-1986, 29 y.o.   MRN: [id number]    Annual Screening Comprehensive Examination   This very nice 29 y.o.female presents for complete physical.  Patient has history of hyperlipidemia controlled with diet and exercise, ADD on adderall, acne currently on topical medication, and anxiety currently on medication prn.  Patient currently is seen by Dr. Kris Mouton for Obgyn  Last pap was done 08/18/13.  She did also have one in March.  It was normal.      She is currently due for tetanus and flu vaccines.    Anxiety is currently under control per patient's report.  She is not currently taking her xanax.  She is currently doing well with ADD.  She takes a tablet at the beginning of the shift and a full tablet halfway through.  Focus is good.     Finally, patient has history of Vitamin D Deficiency and last vitamin D was  Lab Results  Component Value Date   VD25OH 45 03/11/2014  .  Currently on supplementation     Current Outpatient Prescriptions on File Prior to Visit  Medication Sig Dispense Refill  . amphetamine-dextroamphetamine (ADDERALL) 20 MG tablet Take 1 tablet (20 mg total) by mouth 2 (two) times daily. 60 tablet 0  . clindamycin-benzoyl peroxide (BENZACLIN) gel Apply topically 2 (two) times daily. 25 g 1  . norethindrone-ethinyl estradiol-iron (MICROGESTIN FE,GILDESS FE,LOESTRIN FE) 1.5-30 MG-MCG tablet Take 1 tablet by mouth daily.     No current facility-administered medications on file prior to visit.    No Known Allergies  Past Medical History  Diagnosis Date  . Anxiety   . ADD (attention deficit disorder)   . Anemia   . Hyperlipidemia     Immunization History  Administered Date(s) Administered  . Influenza Split 04/17/2013    No past surgical history on file.  Family History  Problem Relation Age of Onset  . Hypertension Mother   . Depression Mother   . Hypertension Father   . Cancer Father     prostate     Social History   Social History  . Marital Status: Single    Spouse Name: N/A  . Number of Children: N/A  . Years of Education: N/A   Occupational History  . Not on file.   Social History Main Topics  . Smoking status: Never Smoker   . Smokeless tobacco: Not on file  . Alcohol Use: Not on file  . Drug Use: Not on file  . Sexual Activity: Not on file   Other Topics Concern  . Not on file   Social History Narrative   Review of Systems  Constitutional: Negative for fever, chills and malaise/fatigue.  HENT: Positive for congestion. Negative for ear pain and sore throat.   Eyes: Negative.   Respiratory: Negative for cough, shortness of breath and wheezing.   Cardiovascular: Negative for chest pain, palpitations and leg swelling.  Gastrointestinal: Negative for heartburn, diarrhea, constipation, blood in stool and melena.  Genitourinary: Negative.   Neurological: Negative for dizziness, sensory change, loss of consciousness and headaches.  Psychiatric/Behavioral: Negative for depression. The patient is not nervous/anxious and does not have insomnia.       Physical Exam  BP 116/80 mmHg  Pulse 76  Temp(Src) 98 F (36.7 C) (Temporal)  Resp 18  Ht 5' 5.5" (1.664 m)  Wt 163 lb (73.936 kg)  BMI 26.70 kg/m2  General Appearance: Well nourished and in no apparent distress. Eyes:  PERRLA, EOMs, conjunctiva no swelling or erythema, normal fundi and vessels. Sinuses: No frontal/maxillary tenderness ENT/Mouth: EACs patent / TMs  nl. Nares clear without erythema, swelling, mucoid exudates. Oral hygiene is good. No erythema, swelling, or exudate. Tongue normal, non-obstructing. Tonsils not swollen or erythematous. Hearing normal.  Neck: Supple, thyroid normal. No bruits, nodes or JVD. Respiratory: Respiratory effort normal.  BS equal and clear bilateral without rales, rhonci, wheezing or stridor. Cardio: Heart sounds are normal with regular rate and rhythm and no murmurs, rubs  or gallops. Peripheral pulses are normal and equal bilaterally without edema. No aortic or femoral bruits. Chest: symmetric with normal excursions and percussion. Abdomen: Flat, soft, with bowl sounds. Nontender, no guarding, rebound, hernias, masses, or organomegaly.  Lymphatics: Non tender without lymphadenopathy.  Musculoskeletal: Full ROM all peripheral extremities, joint stability, 5/5 strength, and normal gait. Skin: Warm and dry without rashes, lesions, cyanosis, clubbing or  ecchymosis.  Neuro: Cranial nerves intact, reflexes equal bilaterally. Normal muscle tone, no cerebellar symptoms. Sensation intact.  Pysch: Awake and oriented X 3, normal affect, Insight and Judgment appropriate.   Assessment and Plan    1. Encounter for general adult medical examination with abnormal findings   2. Hyperlipidemia  - Lipid panel  3. ADD (attention deficit disorder) -cont adderall  4. Anemia, unspecified anemia type  - Iron and TIBC - Vitamin B12  5. Anxiety -well controlled  6. Screening for diabetes mellitus  - Hemoglobin A1c - Insulin, random  7. Screening for thyroid disorder  - TSH  8. Medication management  - CBC with Differential/Platelet - BASIC METABOLIC PANEL WITH GFR - Hepatic function panel - Magnesium  9. Screening for hematuria or proteinuria  - Urinalysis, Routine w reflex microscopic (not at [facility]) - Microalbumin / creatinine urine ratio  10. Vitamin D deficiency  - VITAMIN D 25 Hydroxy (Vit-D Deficiency, Fractures)  11. Screening for cardiovascular condition - EKG 12-Lead     Continue prudent diet as discussed, weight control, regular exercise, and medications. Routine screening labs and tests as requested with regular follow-up as recommended.  Over 40 minutes of exam, counseling, chart review and critical decision making was performed

## 2015-06-01 LAB — URINALYSIS, ROUTINE W REFLEX MICROSCOPIC
Bilirubin Urine: NEGATIVE
GLUCOSE, UA: NEGATIVE
HGB URINE DIPSTICK: NEGATIVE
Ketones, ur: NEGATIVE
Leukocytes, UA: NEGATIVE
Nitrite: NEGATIVE
PH: 5.5 (ref 5.0–8.0)
PROTEIN: NEGATIVE
SPECIFIC GRAVITY, URINE: 1.025 (ref 1.001–1.035)

## 2015-06-01 LAB — VITAMIN D 25 HYDROXY (VIT D DEFICIENCY, FRACTURES): Vit D, 25-Hydroxy: 34 ng/mL (ref 30–100)

## 2015-06-01 LAB — MICROALBUMIN / CREATININE URINE RATIO
Creatinine, Urine: 280 mg/dL (ref 20–320)
MICROALB/CREAT RATIO: 4 ug/mg{creat} (ref <30)
Microalb, Ur: 1.1 mg/dL

## 2015-06-01 LAB — INSULIN, RANDOM: INSULIN: 4.3 u[IU]/mL (ref 2.0–19.6)

## 2015-07-01 ENCOUNTER — Other Ambulatory Visit: Payer: Self-pay | Admitting: Internal Medicine

## 2015-07-01 DIAGNOSIS — F988 Other specified behavioral and emotional disorders with onset usually occurring in childhood and adolescence: Secondary | ICD-10-CM

## 2015-07-01 MED ORDER — AMPHETAMINE-DEXTROAMPHETAMINE 20 MG PO TABS: 20.0000 mg | ORAL_TABLET | Freq: Two times a day (BID) | ORAL | Status: DC

## 2015-08-05 ENCOUNTER — Other Ambulatory Visit: Payer: Self-pay | Admitting: Internal Medicine

## 2015-08-05 DIAGNOSIS — F988 Other specified behavioral and emotional disorders with onset usually occurring in childhood and adolescence: Secondary | ICD-10-CM

## 2015-08-05 MED ORDER — AMPHETAMINE-DEXTROAMPHETAMINE 20 MG PO TABS: 20.0000 mg | ORAL_TABLET | Freq: Two times a day (BID) | ORAL | Status: DC

## 2015-08-09 MED FILL — AMPHETAMINE SALTS 20 MG TAB: 20 | 28 days supply | Qty: 40 | Fill #0

## 2015-09-08 ENCOUNTER — Telehealth: Payer: Self-pay | Admitting: Internal Medicine

## 2015-09-08 DIAGNOSIS — F988 Other specified behavioral and emotional disorders with onset usually occurring in childhood and adolescence: Secondary | ICD-10-CM

## 2015-09-08 MED ORDER — AMPHETAMINE-DEXTROAMPHETAMINE 20 MG PO TABS: 20.0000 mg | ORAL_TABLET | Freq: Two times a day (BID) | ORAL | Status: DC

## 2015-09-08 NOTE — Telephone Encounter (Signed)
adderall request.  Will refill

## 2015-09-09 MED FILL — AMPHETAMINE SALTS 20 MG TAB: 20 | 28 days supply | Qty: 40 | Fill #0

## 2015-09-15 ENCOUNTER — Ambulatory Visit (INDEPENDENT_AMBULATORY_CARE_PROVIDER_SITE_OTHER): Payer: 59 | Admitting: Internal Medicine

## 2015-09-15 VITALS — BP 110/74 | HR 84 | Wt 168.0 lb

## 2015-09-15 DIAGNOSIS — L559 Sunburn, unspecified: Secondary | ICD-10-CM | POA: Diagnosis not present

## 2015-09-15 DIAGNOSIS — L578 Other skin changes due to chronic exposure to nonionizing radiation: Secondary | ICD-10-CM

## 2015-09-15 MED ORDER — CLOBETASOL PROPIONATE 0.05 % EX CREA: 1.0000 "application " | TOPICAL_CREAM | Freq: Two times a day (BID) | CUTANEOUS | Status: DC

## 2015-09-15 MED ORDER — TRIAMCINOLONE ACETONIDE 0.1 % EX CREA: 1.0000 "application " | TOPICAL_CREAM | Freq: Four times a day (QID) | CUTANEOUS | Status: DC | PRN

## 2015-09-15 MED ORDER — PREDNISONE 20 MG PO TABS: ORAL_TABLET | ORAL | Status: DC

## 2015-09-15 MED FILL — CLOBETASOL 0.05% CREAM: 0.05 | 20 days supply | Qty: 30 | Fill #0

## 2015-09-15 NOTE — Progress Notes (Signed)
   Subjective:    Patient ID: Tonya [name], female    DOB: 05/11/1986, 30 y.o.   MRN: [id number]  HPI  Patient presents to the office for evalaution of skin itching.  She reports that she did get sunburned 3 weeks ago and she has since had itching.  NO rash.  She has tried taking benadryl and also has tried using benadryl cream, hydrocortisone cream at home.  No change in personal products.  She has never had an issue with sun exposure in the past.    Review of Systems  Constitutional: Negative for fever, chills and fatigue.  HENT: Negative for facial swelling.   Respiratory: Negative for chest tightness and shortness of breath.   Cardiovascular: Negative for chest pain and palpitations.  Skin: Negative for color change, pallor, rash and wound.       Skin itching        Objective:   Physical Exam  Constitutional: She is oriented to person, place, and time. She appears well-developed and well-nourished. No distress.  HENT:  Head: Normocephalic.  Mouth/Throat: Oropharynx is clear and moist. No oropharyngeal exudate.  Eyes: Conjunctivae are normal. No scleral icterus.  Neck: Normal range of motion. Neck supple. No JVD present. No thyromegaly present.  Cardiovascular: Normal rate, regular rhythm, normal heart sounds and intact distal pulses.  Exam reveals no gallop and no friction rub.   No murmur heard. Pulmonary/Chest: Effort normal and breath sounds normal. No respiratory distress. She has no wheezes. She has no rales. She exhibits no tenderness.  Musculoskeletal: Normal range of motion.  Lymphadenopathy:    She has no cervical adenopathy.  Neurological: She is alert and oriented to person, place, and time.  Skin: Skin is warm and dry. She is not diaphoretic.  Red dry and erythematous macular rash to the bilateral hands with some visible cracking and erosion to the skin.  Bilateral arms and neck with fine macular red rash that is very dry without obvious excoriation, petechia,  purpura, or discharge.    Psychiatric: She has a normal mood and affect. Her behavior is normal. Judgment and thought content normal.  Nursing note and vitals reviewed.   Filed Vitals:   09/15/15 1007  BP: 110/74  Pulse: 84          Assessment & Plan:    1. Dermatitis due to sunburn -prednisone -clobetasol for arms -kenalog for face -claritin zyrtec -benadryl at nighttime

## 2015-09-15 NOTE — Patient Instructions (Signed)
Contact Dermatitis Dermatitis is redness, soreness, and swelling (inflammation) of the skin. Contact dermatitis is a reaction to certain substances that touch the skin. There are two types of contact dermatitis:   Irritant contact dermatitis. This type is caused by something that irritates your skin, such as dry hands from washing them too much. This type does not require previous exposure to the substance for a reaction to occur. This type is more common.  Allergic contact dermatitis. This type is caused by a substance that you are allergic to, such as a nickel allergy or poison ivy. This type only occurs if you have been exposed to the substance (allergen) before. Upon a repeat exposure, your body reacts to the substance. This type is less common. CAUSES  Many different substances can cause contact dermatitis. Irritant contact dermatitis is most commonly caused by exposure to:   Makeup.   Soaps.   Detergents.   Bleaches.   Acids.   Metal salts, such as nickel.  Allergic contact dermatitis is most commonly caused by exposure to:   Poisonous plants.   Chemicals.   Jewelry.   Latex.   Medicines.   Preservatives in products, such as clothing.  RISK FACTORS This condition is more likely to develop in:   People who have jobs that expose them to irritants or allergens.  People who have certain medical conditions, such as asthma or eczema.  SYMPTOMS  Symptoms of this condition may occur anywhere on your body where the irritant has touched you or is touched by you. Symptoms include:  Dryness or flaking.   Redness.   Cracks.   Itching.   Pain or a burning feeling.   Blisters.  Drainage of small amounts of blood or clear fluid from skin cracks. With allergic contact dermatitis, there may also be swelling in areas such as the eyelids, mouth, or genitals.  DIAGNOSIS  This condition is diagnosed with a medical history and physical exam. A patch skin test  may be performed to help determine the cause. If the condition is related to your job, you may need to see an occupational medicine specialist. TREATMENT Treatment for this condition includes figuring out what caused the reaction and protecting your skin from further contact. Treatment may also include:   Steroid creams or ointments. Oral steroid medicines may be needed in more severe cases.  Antibiotics or antibacterial ointments, if a skin infection is present.  Antihistamine lotion or an antihistamine taken by mouth to ease itching.  A bandage (dressing). HOME CARE INSTRUCTIONS Skin Care  Moisturize your skin as needed.   Apply cool compresses to the affected areas.  Try taking a bath with:  Epsom salts. Follow the instructions on the packaging. You can get these at your local pharmacy or grocery store.  Baking soda. Pour a small amount into the bath as directed by your health care provider.  Colloidal oatmeal. Follow the instructions on the packaging. You can get this at your local pharmacy or grocery store.  Try applying baking soda paste to your skin. Stir water into baking soda until it reaches a paste-like consistency.  Do not scratch your skin.  Bathe less frequently, such as every other day.  Bathe in lukewarm water. Avoid using hot water. Medicines  Take or apply over-the-counter and prescription medicines only as told by your health care provider.   If you were prescribed an antibiotic medicine, take or apply your antibiotic as told by your health care provider. Do not stop using the   antibiotic even if your condition starts to improve. General Instructions  Keep all follow-up visits as told by your health care provider. This is important.  Avoid the substance that caused your reaction. If you do not know what caused it, keep a journal to try to track what caused it. Write down:  What you eat.  What cosmetic products you use.  What you drink.  What  you wear in the affected area. This includes jewelry.  If you were given a dressing, take care of it as told by your health care provider. This includes when to change and remove it. SEEK MEDICAL CARE IF:   Your condition does not improve with treatment.  Your condition gets worse.  You have signs of infection such as swelling, tenderness, redness, soreness, or warmth in the affected area.  You have a fever.  You have new symptoms. SEEK IMMEDIATE MEDICAL CARE IF:   You have a severe headache, neck pain, or neck stiffness.  You vomit.  You feel very sleepy.  You notice red streaks coming from the affected area.  Your bone or joint underneath the affected area becomes painful after the skin has healed.  The affected area turns darker.  You have difficulty breathing.   This information is not intended to replace advice given to you by your health care provider. Make sure you discuss any questions you have with your health care provider.   Document Released: 06/09/2000 Document Revised: 03/03/2015 Document Reviewed: 10/28/2014 Elsevier Interactive Patient Education 2016 Elsevier Inc[employer]  

## 2015-10-12 ENCOUNTER — Other Ambulatory Visit: Payer: Self-pay | Admitting: Internal Medicine

## 2015-10-12 DIAGNOSIS — F988 Other specified behavioral and emotional disorders with onset usually occurring in childhood and adolescence: Secondary | ICD-10-CM

## 2015-10-12 MED ORDER — AMPHETAMINE-DEXTROAMPHETAMINE 20 MG PO TABS: 20.0000 mg | ORAL_TABLET | Freq: Two times a day (BID) | ORAL | Status: DC

## 2015-10-20 ENCOUNTER — Ambulatory Visit: Payer: Self-pay | Admitting: Internal Medicine

## 2015-10-20 MED FILL — predniSONE 20 MG TABS: 20 | 14 days supply | Qty: 24 | Fill #0

## 2015-11-01 ENCOUNTER — Ambulatory Visit (INDEPENDENT_AMBULATORY_CARE_PROVIDER_SITE_OTHER): Payer: 59 | Admitting: Internal Medicine

## 2015-11-01 VITALS — BP 122/76 | HR 80 | Temp 98.0°F | Resp 18 | Ht 65.5 in | Wt 170.0 lb

## 2015-11-01 DIAGNOSIS — L299 Pruritus, unspecified: Secondary | ICD-10-CM | POA: Diagnosis not present

## 2015-11-01 DIAGNOSIS — F909 Attention-deficit hyperactivity disorder, unspecified type: Secondary | ICD-10-CM | POA: Diagnosis not present

## 2015-11-01 DIAGNOSIS — M546 Pain in thoracic spine: Secondary | ICD-10-CM | POA: Diagnosis not present

## 2015-11-01 DIAGNOSIS — Z79899 Other long term (current) drug therapy: Secondary | ICD-10-CM | POA: Diagnosis not present

## 2015-11-01 DIAGNOSIS — F988 Other specified behavioral and emotional disorders with onset usually occurring in childhood and adolescence: Secondary | ICD-10-CM

## 2015-11-01 LAB — CBC WITH DIFFERENTIAL/PLATELET
Basophils Absolute: 0 cells/uL (ref 0–200)
Basophils Relative: 0 %
EOS PCT: 1 %
Eosinophils Absolute: 87 cells/uL (ref 15–500)
HEMATOCRIT: 40.7 % (ref 35.0–45.0)
Hemoglobin: 13.5 g/dL (ref 11.7–15.5)
LYMPHS PCT: 28 %
Lymphs Abs: 2436 cells/uL (ref 850–3900)
MCH: 29.9 pg (ref 27.0–33.0)
MCHC: 33.2 g/dL (ref 32.0–36.0)
MCV: 90 fL (ref 80.0–100.0)
MPV: 9.5 fL (ref 7.5–12.5)
Monocytes Absolute: 609 cells/uL (ref 200–950)
Monocytes Relative: 7 %
NEUTROS PCT: 64 %
Neutro Abs: 5568 cells/uL (ref 1500–7800)
Platelets: 227 10*3/uL (ref 140–400)
RBC: 4.52 MIL/uL (ref 3.80–5.10)
RDW: 13.3 % (ref 11.0–15.0)
WBC: 8.7 10*3/uL (ref 3.8–10.8)

## 2015-11-01 LAB — COMPREHENSIVE METABOLIC PANEL
ALT: 23 U/L (ref 6–29)
AST: 10 U/L (ref 10–30)
Albumin: 4.2 g/dL (ref 3.6–5.1)
Alkaline Phosphatase: 39 U/L (ref 33–115)
BILIRUBIN TOTAL: 0.4 mg/dL (ref 0.2–1.2)
BUN: 19 mg/dL (ref 7–25)
CALCIUM: 9.1 mg/dL (ref 8.6–10.2)
CHLORIDE: 103 mmol/L (ref 98–110)
CO2: 26 mmol/L (ref 20–31)
Creat: 0.77 mg/dL (ref 0.50–1.10)
GLUCOSE: 82 mg/dL (ref 65–99)
Potassium: 4.1 mmol/L (ref 3.5–5.3)
SODIUM: 139 mmol/L (ref 135–146)
Total Protein: 6.6 g/dL (ref 6.1–8.1)

## 2015-11-01 MED ORDER — AMPHETAMINE-DEXTROAMPHETAMINE 20 MG PO TABS: 20.0000 mg | ORAL_TABLET | Freq: Two times a day (BID) | ORAL | Status: DC

## 2015-11-01 NOTE — Progress Notes (Signed)
Assessment and Plan:   1. Midline thoracic back pain  - Ambulatory referral to Orthopedics  2. Medication management  - CBC with Differential/Platelet - Comprehensive metabolic panel  3. ADD (attention deficit disorder)  - amphetamine-dextroamphetamine (ADDERALL) 20 MG tablet; Take 1 tablet (20 mg total) by mouth 2 (two) times daily. Take 1 tablet 2 x daily 5 days per week for concentration  Dispense: 40 tablet; Refill: 0  4.  Pruritis -recommended patient see derm -change to Dreft or ivory snow sensitive detergent   Continue diet and meds as discussed. Further disposition pending results of labs.  HPI 30 y.o. female  presents for 3 month follow up of ADD on adderall.  She is taking only on days that she is working. No issues with CP, SOB, or palpitations.  She is doing well with focusing at work.  She reports she is still having some skin itching.  She is taking prednisone currently with some relief.  Topical creams did not help.  She has seen Derm in the past.  She is using Tide Detergent currently  She also reports upper back pain that is especially bothersome when she is at work.  She has no relief of this pain with OTC antiinflammatory.  She reports no central midline pain.  She thinks that it may be muscular.  No injury that she can think of.    Current Medications:  Current Outpatient Prescriptions on File Prior to Visit  Medication Sig Dispense Refill  . amphetamine-dextroamphetamine (ADDERALL) 20 MG tablet Take 1 tablet (20 mg total) by mouth 2 (two) times daily. Take 1 tablet 2 x daily 5 days per week for concentration 40 tablet 0  . norethindrone-ethinyl estradiol-iron (MICROGESTIN FE,GILDESS FE,LOESTRIN FE) 1.5-30 MG-MCG tablet Take 1 tablet by mouth daily.    . predniSONE (DELTASONE) 20 MG tablet 3 tabs po daily x 3 days, then 2 tabs x 3 days, then 1.5 tabs x 3 days, then 1 tab x 3 days, then 0.5 tabs x 3 days 27 tablet 0  . triamcinolone cream (KENALOG) 0.1 % Apply 1  application topically 4 (four) times daily as needed. 30 g 0   No current facility-administered medications on file prior to visit.    Medical History:  Past Medical History  Diagnosis Date  . Anxiety   . ADD (attention deficit disorder)   . Anemia   . Hyperlipidemia     Allergies: No Known Allergies   Review of Systems:  Review of Systems  Constitutional: Negative for fever, chills and malaise/fatigue.  HENT: Negative for congestion, ear pain and sore throat.   Eyes: Negative.   Respiratory: Negative for cough, shortness of breath and wheezing.   Cardiovascular: Negative for chest pain, palpitations and leg swelling.  Gastrointestinal: Negative for heartburn, abdominal pain and melena.  Genitourinary: Negative.   Skin: Negative.   Neurological: Negative for dizziness, sensory change, loss of consciousness and headaches.  Psychiatric/Behavioral: Negative for depression. The patient is not nervous/anxious and does not have insomnia.     Family history- Review and unchanged  Social history- Review and unchanged  Physical Exam: BP 122/76 mmHg  Pulse 80  Temp(Src) 98 F (36.7 C) (Temporal)  Resp 18  Ht 5' 5.5" (1.664 m)  Wt 170 lb (77.111 kg)  BMI 27.85 kg/m2 Wt Readings from Last 3 Encounters:  11/01/15 170 lb (77.111 kg)  09/15/15 168 lb (76.204 kg)  05/31/15 163 lb (73.936 kg)    General Appearance: Well nourished well developed, in no  apparent distress. Eyes: PERRLA, EOMs, conjunctiva no swelling or erythema ENT/Mouth: Ear canals normal without obstruction, swelling, erythma, discharge.  TMs normal bilaterally.  Oropharynx moist, clear, without exudate, or postoropharyngeal swelling. Neck: Supple, thyroid normal,no cervical adenopathy  Respiratory: Respiratory effort normal, Breath sounds clear A&P without rhonchi, wheeze, or rale.  No retractions, no accessory usage. Cardio: RRR with no MRGs. Brisk peripheral pulses without edema.  Abdomen: Soft, + BS,  Non  tender, no guarding, rebound, hernias, masses. Musculoskeletal: Full ROM, 5/5 strength, Normal gait Skin: Warm, dry without rashes, lesions, ecchymosis.  Neuro: Awake and oriented X 3, Cranial nerves intact. Normal muscle tone, no cerebellar symptoms. Psych: Normal affect, Insight and Judgment appropriate.    Starlyn Skeans, PA-C 11:44 AM [facility]

## 2015-11-17 MED FILL — AMPHETAMINE SALTS 20 MG TAB: 20 | 28 days supply | Qty: 40 | Fill #0

## 2015-11-18 DIAGNOSIS — Z01419 Encounter for gynecological examination (general) (routine) without abnormal findings: Secondary | ICD-10-CM | POA: Diagnosis not present

## 2015-11-24 ENCOUNTER — Ambulatory Visit: Payer: Self-pay | Admitting: Internal Medicine

## 2015-11-26 ENCOUNTER — Ambulatory Visit (INDEPENDENT_AMBULATORY_CARE_PROVIDER_SITE_OTHER): Payer: 59 | Admitting: Internal Medicine

## 2015-11-26 VITALS — BP 132/78 | HR 88 | Temp 97.7°F | Resp 16 | Ht 65.5 in | Wt 172.2 lb

## 2015-11-26 DIAGNOSIS — L0231 Cutaneous abscess of buttock: Secondary | ICD-10-CM | POA: Diagnosis not present

## 2015-11-26 MED ORDER — SULFAMETHOXAZOLE-TRIMETHOPRIM 800-160 MG PO TABS: ORAL_TABLET | ORAL | Status: AC

## 2015-11-26 MED ORDER — CEPHALEXIN 500 MG PO CAPS: ORAL_CAPSULE | ORAL | Status: AC

## 2015-11-26 MED FILL — CEPHALEXIN 500 MG CAPSULE: 500 | 15 days supply | Qty: 60 | Fill #0

## 2015-11-26 MED FILL — SULFAMETHOXAZOLE-TMP DS TAB: 800-160 | 30 days supply | Qty: 60 | Fill #0

## 2015-11-26 NOTE — Patient Instructions (Signed)
1/2 cup of chlorox in tub bath   - soak for 10 minutes 2 x /day +++++++++++++++++++++++++++++++++++ Abscess An abscess is an infected area that contains a collection of pus and debris.It can occur in almost any part of the body. An abscess is also known as a furuncle or boil. CAUSES  An abscess occurs when tissue gets infected. This can occur from blockage of oil or sweat glands, infection of hair follicles, or a minor injury to the skin. As the body tries to fight the infection, pus collects in the area and creates pressure under the skin. This pressure causes pain. People with weakened immune systems have difficulty fighting infections and get certain abscesses more often.  SYMPTOMS Usually an abscess develops on the skin and becomes a painful mass that is red, warm, and tender. If the abscess forms under the skin, you may feel a moveable soft area under the skin. Some abscesses break open (rupture) on their own, but most will continue to get worse without care. The infection can spread deeper into the body and eventually into the bloodstream, causing you to feel ill.  DIAGNOSIS  Your caregiver will take your medical history and perform a physical exam. A sample of fluid may also be taken from the abscess to determine what is causing your infection. TREATMENT  Your caregiver may prescribe antibiotic medicines to fight the infection. However, taking antibiotics alone usually does not cure an abscess. Your caregiver may need to make a small cut (incision) in the abscess to drain the pus. In some cases, gauze is packed into the abscess to reduce pain and to continue draining the area. HOME CARE INSTRUCTIONS   Only take over-the-counter or prescription medicines for pain, discomfort, or fever as directed by your caregiver.  If you were prescribed antibiotics, take them as directed. Finish them even if you start to feel better.  If gauze is used, follow your caregiver's directions for changing the  gauze.  To avoid spreading the infection:  Keep your draining abscess covered with a bandage.  Wash your hands well.  Do not share personal care items, towels, or whirlpools with others.  Avoid skin contact with others.  Keep your skin and clothes clean around the abscess.  Keep all follow-up appointments as directed by your caregiver. SEEK MEDICAL CARE IF:   You have increased pain, swelling, redness, fluid drainage, or bleeding.  You have muscle aches, chills, or a general ill feeling.  You have a fever. MAKE SURE YOU:   Understand these instructions.  Will watch your condition.  Will get help right away if you are not doing well or get worse.   This information is not intended to replace advice given to you by your health care provider. Make sure you discuss any questions you have with your health care provider.   Document Released: 03/22/2005 Document Revised: 12/12/2011 Document Reviewed: 08/25/2011 Elsevier Interactive Patient Education [employer].

## 2015-11-27 ENCOUNTER — Encounter: Payer: Self-pay | Admitting: Internal Medicine

## 2015-11-27 NOTE — Progress Notes (Signed)
  Subjective:    Patient ID: Tonya [name], female    DOB: 08-26-1985, 30 y.o.   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 Now she has developed a crop of bumps - a few of which are pustular and draining.   Medication Sig  . MICROGESTIN FE 1.5-30 MG-MCG  Take 1 tablet by mouth daily.  . ADDERALL 20 MG tablet Take 1 tablet (20 mg total) by mouth 2 (two) times daily.    No Known Allergies   Past Medical History  Diagnosis Date  . Anxiety   . ADD (attention deficit disorder)   . Anemia   . Hyperlipidemia    Review of Systems  10 point systems review negative except as above.    Objective:   Physical Exam  BP 132/78  P 88  T 97.7 F   R 16  Ht 5' 5.5"   Wt 172 lb   BMI 28.21   On the L buttock there is a crop of bumpy 3- 5 mm pink to red raised rash with few draining pus and no sign of cellulitis/lymphangitis.    Assessment & Plan:   1. Abscess of multiple sites of buttock  - WOUND CULTURE ([facility] ONLY)  - will treat empirically pending cultures. Recommend patient soak in a tub 10" water & 1/2 cup Clorox for 10 -15 min 2 x/day  - sulfamethoxazole-trimethoprim (BACTRIM DS,SEPTRA DS) 800-160 MG tablet; Take 1 tablet 2 x / day with food for infection  Dispense: 60 tablet; Refill: 0 - cephALEXin (KEFLEX) 500 MG capsule; Take 1 capsule 4 x /day with meals & bedtime for infection  Dispense: 60 capsule; Refill: 0

## 2015-11-29 ENCOUNTER — Other Ambulatory Visit: Payer: Self-pay | Admitting: Internal Medicine

## 2015-11-29 DIAGNOSIS — Z22322 Carrier or suspected carrier of Methicillin resistant Staphylococcus aureus: Secondary | ICD-10-CM

## 2015-11-29 LAB — WOUND CULTURE
GRAM STAIN: NONE SEEN
Gram Stain: NONE SEEN

## 2015-11-29 MED ORDER — RIFAMPIN 300 MG PO CAPS: ORAL_CAPSULE | ORAL | Status: AC

## 2015-11-29 MED FILL — rifAMPin 300 MG CAPS: 300 | 10 days supply | Qty: 20 | Fill #0

## 2015-12-01 ENCOUNTER — Ambulatory Visit: Payer: Self-pay | Admitting: Internal Medicine

## 2015-12-13 ENCOUNTER — Telehealth: Payer: Self-pay | Admitting: *Deleted

## 2015-12-13 ENCOUNTER — Other Ambulatory Visit: Payer: Self-pay | Admitting: Internal Medicine

## 2015-12-13 NOTE — Telephone Encounter (Signed)
Patient called and asked if she needs to complete the whole RX of #60 tablets of Bactrim-DS.  Per Dr Melford Aase, she can stop the medication if all her areas are dried up.  The patient states they are and she will stop the medication and will call back to schedule a nasal swab for MRSA, to ensure the infection is gone.

## 2015-12-21 ENCOUNTER — Other Ambulatory Visit: Payer: Self-pay | Admitting: Internal Medicine

## 2015-12-21 MED ORDER — AMPHETAMINE-DEXTROAMPHETAMINE 20 MG PO TABS: ORAL_TABLET | ORAL | Status: DC

## 2015-12-22 MED FILL — DEXTROAMP-AMPHETAMIN 20 MG: 20 | 28 days supply | Qty: 40 | Fill #0

## 2016-01-20 ENCOUNTER — Other Ambulatory Visit: Payer: Self-pay | Admitting: *Deleted

## 2016-01-20 MED ORDER — AMPHETAMINE-DEXTROAMPHETAMINE 20 MG PO TABS: ORAL_TABLET | ORAL | 0 refills | Status: DC

## 2016-01-21 MED FILL — DEXTROAMP-AMPHETAMIN 20 MG: 20 | 30 days supply | Qty: 40 | Fill #0

## 2016-02-22 ENCOUNTER — Other Ambulatory Visit: Payer: Self-pay | Admitting: Internal Medicine

## 2016-02-22 MED ORDER — AMPHETAMINE-DEXTROAMPHETAMINE 20 MG PO TABS
ORAL_TABLET | ORAL | 0 refills | Status: DC
Start: 2016-02-22 — End: 2016-03-27

## 2016-02-22 MED FILL — DEXTROAMP-AMPHETAMIN 20 MG: 20 | 28 days supply | Qty: 40 | Fill #0

## 2016-03-07 ENCOUNTER — Ambulatory Visit (INDEPENDENT_AMBULATORY_CARE_PROVIDER_SITE_OTHER): Payer: 59 | Admitting: Physician Assistant

## 2016-03-07 ENCOUNTER — Encounter: Payer: Self-pay | Admitting: Physician Assistant

## 2016-03-07 VITALS — BP 118/66 | HR 73 | Temp 97.5°F | Resp 14 | Ht 65.5 in | Wt 177.5 lb

## 2016-03-07 DIAGNOSIS — J029 Acute pharyngitis, unspecified: Secondary | ICD-10-CM | POA: Diagnosis not present

## 2016-03-07 LAB — POCT RAPID STREP A (OFFICE): Rapid Strep A Screen: NEGATIVE

## 2016-03-07 MED ORDER — PREDNISONE 20 MG PO TABS: ORAL_TABLET | ORAL | 0 refills | Status: DC

## 2016-03-07 MED FILL — predniSONE 20 MG TABS: 20 | 7 days supply | Qty: 10 | Fill #0

## 2016-03-07 NOTE — Progress Notes (Signed)
   Subjective:    Patient ID: Tonya [name], female    DOB: [birth year], 30 y.o.   MRN: [id number]  HPI 30 y.o. WF presents with dry throat, fever last night 101, body aches, has had workers with positive strep and mom with pneumonia 2 weeks ago. Has taken tylenol 8 AM this morning.   Blood pressure 118/66, pulse 73, temperature 97.5 F (36.4 C), resp. rate 14, height 5' 5.5" (1.664 m), weight 177 lb 8 oz (80.5 kg), last menstrual period 02/25/2016, SpO2 98 %.  Medications Current Outpatient Prescriptions on File Prior to Visit  Medication Sig  . amphetamine-dextroamphetamine (ADDERALL) 20 MG tablet Take 1 to 2 tablets prn for focus no more than 5 days per week  . norethindrone-ethinyl estradiol-iron (MICROGESTIN FE,GILDESS FE,LOESTRIN FE) 1.5-30 MG-MCG tablet Take 1 tablet by mouth daily.   No current facility-administered medications on file prior to visit.     Problem list She has Anxiety; ADD (attention deficit disorder); Anemia; and Hyperlipidemia on her problem list.   Review of Systems  Constitutional: Positive for fever. Negative for chills and diaphoresis.  HENT: Positive for rhinorrhea and sore throat. Negative for congestion, ear pain, sinus pressure and sneezing.   Respiratory: Negative.  Negative for cough and shortness of breath.   Cardiovascular: Negative.   Musculoskeletal: Positive for myalgias. Negative for arthralgias and neck pain.  Neurological: Negative.  Negative for headaches.       Objective:   Physical Exam  Constitutional: She appears well-developed and well-nourished.  HENT:  Head: Normocephalic and atraumatic.  Right Ear: External ear normal.  Left Ear: External ear normal.  Mouth/Throat: Uvula is midline and mucous membranes are normal. Posterior oropharyngeal erythema present. No oropharyngeal exudate or posterior oropharyngeal edema.  Eyes: Conjunctivae and EOM are normal. Pupils are equal, round, and reactive to light.  Neck: Normal range  of motion. Neck supple.  Cardiovascular: Normal rate, regular rhythm and normal heart sounds.   Pulmonary/Chest: Effort normal and breath sounds normal.  Abdominal: Soft. Bowel sounds are normal.  Lymphadenopathy:    She has no cervical adenopathy.  Skin: Skin is warm and dry.       Assessment & Plan:  1. 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

## 2016-03-07 NOTE — Patient Instructions (Signed)
Please take the prednisone to help decrease inflammation and therefore decrease symptoms. Take it it with food to avoid GI upset. It can cause increased energy but on the other hand it can make it hard to sleep at night so please take it AT NIGHT WITH DINNER, it takes 8-12 hours to start working so it will NOT affect your sleeping if you take it at night with your food!!  If you are diabetic it will increase your sugars so decrease carbs and monitor your sugars closely.    HOW TO TREAT VIRAL COUGH AND COLD SYMPTOMS:  -Symptoms usually last at least 1 week with the worst symptoms being around day 4.  - colds usually start with a sore throat and end with a cough, and the cough can take 2 weeks to get better.  -No antibiotics are needed for colds, flu, sore throats, cough, bronchitis UNLESS symptoms are longer than 7 days OR if you are getting better then get drastically worse.  -There are a lot of combination medications (Dayquil, Nyquil, Vicks 44, tyelnol cold and sinus, ETC). Please look at the ingredients on the back so that you are treating the correct symptoms and not doubling up on medications/ingredients.    Medicines you can use  Nasal congestion  - pseudoephedrine (Sudafed)- behind the counter, do not use if you have high blood pressure, medicine that have -D in them.  - phenylephrine (Sudafed PE) -Dextormethorphan + chlorpheniramine (Coridcidin HBP)- okay if you have high blood pressure -Oxymetazoline (Afrin) nasal spray- LIMIT to 3 days -Saline nasal spray -Neti pot (used distilled or bottled water)  Ear pain/congestion  -pseudoephedrine (sudafed) - Nasonex/flonase nasal spray  Fever  -Acetaminophen (Tyelnol) -Ibuprofen (Advil, motrin, aleve)  Sore Throat  -Acetaminophen (Tyelnol) -Ibuprofen (Advil, motrin, aleve) -Drink a lot of water -Gargle with salt water - Rest your voice (don't talk) -Throat sprays -Cough drops  Body Aches  -Acetaminophen (Tyelnol) -Ibuprofen  (Advil, motrin, aleve)  Headache  -Acetaminophen (Tyelnol) -Ibuprofen (Advil, motrin, aleve) - Exedrin, Exedrin Migraine  Allergy symptoms (cough, sneeze, runny nose, itchy eyes) -Claritin or loratadine cheapest but likely the weakest  -Zyrtec or certizine at night because it can make you sleepy -The strongest is allegra or fexafinadine  Cheapest at [employer], [employer], [employer]  Cough  -Dextromethorphan (Delsym)- medicine that has DM in it -Guafenesin (Mucinex/Robitussin) - cough drops - drink lots of water  Chest Congestion  -Guafenesin (Mucinex/Robitussin)  Red Itchy Eyes  - Naphcon-A  Upset Stomach  - Bland diet (nothing spicy, greasy, fried, and high acid foods like tomatoes, oranges, berries) -OKAY- cereal, bread, soup, crackers, rice -Eat smaller more frequent meals -reduce caffeine, no alcohol -Loperamide (Imodium-AD) if diarrhea -Prevacid for heart burn  General health when sick  -Hydration -wash your hands frequently -keep surfaces clean -change pillow cases and sheets often -Get fresh air but do not exercise strenuously -Vitamin D, double up on it - Vitamin C -Zinc       

## 2016-03-08 LAB — COMPREHENSIVE METABOLIC PANEL
ALBUMIN: 4.4 g/dL (ref 3.6–5.1)
ALT: 31 U/L — ABNORMAL HIGH (ref 6–29)
AST: 18 U/L (ref 10–30)
Alkaline Phosphatase: 50 U/L (ref 33–115)
BUN: 9 mg/dL (ref 7–25)
CHLORIDE: 101 mmol/L (ref 98–110)
CO2: 24 mmol/L (ref 20–31)
Calcium: 9.5 mg/dL (ref 8.6–10.2)
Creat: 0.83 mg/dL (ref 0.50–1.10)
Glucose, Bld: 109 mg/dL — ABNORMAL HIGH (ref 65–99)
POTASSIUM: 4 mmol/L (ref 3.5–5.3)
Sodium: 138 mmol/L (ref 135–146)
TOTAL PROTEIN: 6.9 g/dL (ref 6.1–8.1)
Total Bilirubin: 0.8 mg/dL (ref 0.2–1.2)

## 2016-03-08 LAB — CBC WITH DIFFERENTIAL/PLATELET
BASOS ABS: 0 {cells}/uL (ref 0–200)
Basophils Relative: 0 %
EOS ABS: 0 {cells}/uL — AB (ref 15–500)
Eosinophils Relative: 0 %
HCT: 41.9 % (ref 35.0–45.0)
Hemoglobin: 13.9 g/dL (ref 11.7–15.5)
LYMPHS PCT: 13 %
Lymphs Abs: 871 cells/uL (ref 850–3900)
MCH: 29.8 pg (ref 27.0–33.0)
MCHC: 33.2 g/dL (ref 32.0–36.0)
MCV: 89.7 fL (ref 80.0–100.0)
MONOS PCT: 7 %
MPV: 9.9 fL (ref 7.5–12.5)
Monocytes Absolute: 469 cells/uL (ref 200–950)
NEUTROS ABS: 5360 {cells}/uL (ref 1500–7800)
Neutrophils Relative %: 80 %
PLATELETS: 205 10*3/uL (ref 140–400)
RBC: 4.67 MIL/uL (ref 3.80–5.10)
RDW: 12.9 % (ref 11.0–15.0)
WBC: 6.7 10*3/uL (ref 3.8–10.8)

## 2016-03-10 ENCOUNTER — Ambulatory Visit (INDEPENDENT_AMBULATORY_CARE_PROVIDER_SITE_OTHER): Payer: 59 | Admitting: Physician Assistant

## 2016-03-10 ENCOUNTER — Encounter: Payer: Self-pay | Admitting: Physician Assistant

## 2016-03-10 VITALS — BP 124/70 | HR 122 | Temp 97.7°F | Resp 18 | Ht 65.5 in | Wt 177.0 lb

## 2016-03-10 DIAGNOSIS — R509 Fever, unspecified: Secondary | ICD-10-CM | POA: Diagnosis not present

## 2016-03-10 LAB — COMPREHENSIVE METABOLIC PANEL
ALBUMIN: 4.2 g/dL (ref 3.6–5.1)
ALT: 204 U/L — ABNORMAL HIGH (ref 6–29)
AST: 147 U/L — AB (ref 10–30)
Alkaline Phosphatase: 85 U/L (ref 33–115)
BILIRUBIN TOTAL: 0.9 mg/dL (ref 0.2–1.2)
BUN: 5 mg/dL — ABNORMAL LOW (ref 7–25)
CALCIUM: 9 mg/dL (ref 8.6–10.2)
CHLORIDE: 103 mmol/L (ref 98–110)
CO2: 25 mmol/L (ref 20–31)
CREATININE: 0.87 mg/dL (ref 0.50–1.10)
GLUCOSE: 127 mg/dL — AB (ref 65–99)
Potassium: 3.8 mmol/L (ref 3.5–5.3)
SODIUM: 139 mmol/L (ref 135–146)
Total Protein: 6.7 g/dL (ref 6.1–8.1)

## 2016-03-10 LAB — CBC WITH DIFFERENTIAL/PLATELET
BASOS ABS: 39 {cells}/uL (ref 0–200)
Basophils Relative: 1 %
EOS PCT: 1 %
Eosinophils Absolute: 39 cells/uL (ref 15–500)
HCT: 40 % (ref 35.0–45.0)
Hemoglobin: 13.6 g/dL (ref 11.7–15.5)
LYMPHS PCT: 19 %
Lymphs Abs: 741 cells/uL — ABNORMAL LOW (ref 850–3900)
MCH: 30.1 pg (ref 27.0–33.0)
MCHC: 34 g/dL (ref 32.0–36.0)
MCV: 88.5 fL (ref 80.0–100.0)
MPV: 9.5 fL (ref 7.5–12.5)
Monocytes Absolute: 507 cells/uL (ref 200–950)
Monocytes Relative: 13 %
NEUTROS PCT: 66 %
Neutro Abs: 2574 cells/uL (ref 1500–7800)
Platelets: 191 10*3/uL (ref 140–400)
RBC: 4.52 MIL/uL (ref 3.80–5.10)
RDW: 12.7 % (ref 11.0–15.0)
WBC: 3.9 10*3/uL (ref 3.8–10.8)

## 2016-03-10 LAB — POC INFLUENZA A&B (BINAX/QUICKVUE)
INFLUENZA B, POC: NEGATIVE
Influenza A, POC: NEGATIVE

## 2016-03-10 MED ORDER — AZITHROMYCIN 250 MG PO TABS: ORAL_TABLET | ORAL | 1 refills | Status: AC

## 2016-03-10 MED ORDER — KETOROLAC TROMETHAMINE 60 MG/2ML IM SOLN
60.0000 mg | Freq: Once | INTRAMUSCULAR | Status: AC
  Administered 2016-03-10: 60 mg via INTRAMUSCULAR

## 2016-03-10 MED ORDER — PROMETHAZINE-CODEINE 6.25-10 MG/5ML PO SYRP: 5.0000 mL | ORAL_SOLUTION | Freq: Four times a day (QID) | ORAL | 0 refills | Status: DC | PRN

## 2016-03-10 MED FILL — AZITHROMYCIN 250 MG TABLET: 250 | 5 days supply | Qty: 6 | Fill #0

## 2016-03-10 MED FILL — PROMETHAZINE-CODEINE SYRUP: 6.25-10 | 12 days supply | Qty: 240 | Fill #0

## 2016-03-10 NOTE — Patient Instructions (Signed)
The majority of colds are caused by viruses and do not require antibiotics. Please read the rest of this hand out to learn more about the common cold and what you can do to help yourself as well as help prevent the over use of antibiotics.   COMMON COLD SIGNS AND SYMPTOMS - The common cold usually causes nasal congestion, runny nose, and sneezing. A sore throat may be present on the first day but usually resolves quickly. If a cough occurs, it generally develops on about the fourth or fifth day of symptoms, and is always the last thing to go away.   COMMON COLD COMPLICATIONS - In most cases, colds do not cause serious illness or complications. Most colds last for three to seven days, although many people continue to have symptoms (coughing, sneezing, congestion) for up to two weeks.  COMMON COLD TREATMENT - There is no specific treatment for the viruses that cause the common cold. Most treatments are aimed at relieving some of the symptoms of the cold, but do not shorten or cure the cold.   Antibiotics are not useful for treating the common cold; antibiotics are only used to treat illnesses caused by bacteria, not viruses. Unnecessary use of antibiotics for the treatment of the common cold can cause allergic reactions, diarrhea, or other gastrointestinal symptoms in some patients.   The symptoms of a cold will resolve over time, even without any treatment. People with underlying medical conditions and those who use other over-the-counter or prescription medications should speak with their healthcare provider or pharmacist to ensure that it is safe to use these treatments. The following are treatments that may reduce the symptoms caused by the common cold.  Nasal congestion - Decongestants are good for nasal congestion- if you feel very stuffy but no mucus is coming out, this is the medication that will help you the most.  Pseudoephedrine is a decongestant that can improve nasal congestion. Although a  prescription is not required, drugstores in the [place] keep pseudoephedrine behind the counter, so it must be requested from a pharmacist. If you have a heart condition or high blood pressure please use Coricidin BPH instead.   Runny nose - Antihistamines such as diphenhydramine (Benadryl), certazine (Zyrtec) which are best taking at night because they can make you tired OR loratadine (Claritin),  fexafinadine (Allegra) help with a runny nose.   Nasal sprays such an oxymetazoline (Afrin and others) may also give temporary relief of nasal congestion. However, these sprays should never be used for more than two to three days; use for more than three days use can worsen congestion.  Nasocort is now over the counter and can help decrease a runny nose. Please stop the medication if you have blurry vision or nose bleeds.   Sore throat and headache - Sore throat and headache are best treated with a mild pain reliever such as acetaminophen (Tylenol) or a non-steroidal anti-inflammatory agent such as ibuprofen or naproxen (Motrin or Aleve). These medications should be taken with food to prevent stomach problems. As well as gargling with warm water and salt.   Cough - Common cough medicine ingredients include guaifenesin and dextromethorphan; these are often combined with other medications in over-the-counter cold formulas. Often a cough is worse at night or first in the morning due to post nasal drip from you nose. You can try to sleep at an angle to decrease a cough.   Alternative treatments - Heated, humidified air can improve symptoms of nasal congestion and   runny nose, and causes few to no side effects. A number of alternative products, including vitamin C, doubling up on your vitamin D and herbal products such as echinacea, may help. Certain products, such as nasal gels that contain zinc (eg, Zicam), have been associated with a permanent loss of smell.  Antibiotics - Antibiotics should not be used to  treat an uncomplicated common cold. Often you need to give your body 7-10 days to fight off a common cold while treating the symptoms with the medications listed above. If after 7-10 days your symptoms are not improving, you are getting worse, you have shortness of breath, chest pain, a fever of over 103 you should seek medical help immediately.   PREVENTION IS THE BEST MEDICINE - Hand washing is an essential and highly effective way to prevent the spread of infection.  Alcohol-based hand rubs are a good alternative for disinfecting hands if a sink is not available.  Hands should be washed before preparing food and eating and after coughing, blowing the nose, or sneezing. While it is not always possible to limit contact with people who may be infected with a cold, touching the eyes, nose, or mouth after direct contact should be avoided when possible. Sneezing/coughing into the sleeve of one's clothing (at the inner elbow) is another means of containing sprays of saliva and secretions and does not contaminate the hands.     -Make sure you are drinking plenty of fluids to stay hydrated.  -while drinking fluids pinch and hold nose close and swallow, to help open eustachian tubes and drain fluid behind ear drums. -you can do salt water gargles. You can also do 1 TSP liquid Maalox and 1 TSP liquid benadryl- mix/ gargle/ spit  If you are not feeling better in 10-14 days, then please call the office.  Pharyngitis Pharyngitis is redness, pain, and swelling (inflammation) of your pharynx.  CAUSES  Pharyngitis is usually caused by infection. Most of the time, these infections are from viruses (viral) and are part of a cold. However, sometimes pharyngitis is caused by bacteria (bacterial). Pharyngitis can also be caused by allergies. Viral pharyngitis may be spread from person to person by coughing, sneezing, and personal items or utensils (cups, forks, spoons, toothbrushes). Bacterial pharyngitis may be  spread from person to person by more intimate contact, such as kissing.  SIGNS AND SYMPTOMS  Symptoms of pharyngitis include:   Sore throat.   Tiredness (fatigue).   Low-grade fever.   Headache.  Joint pain and muscle aches.  Skin rashes.  Swollen lymph nodes.  Plaque-like film on throat or tonsils (often seen with bacterial pharyngitis). DIAGNOSIS  Your health care provider will ask you questions about your illness and your symptoms. Your medical history, along with a physical exam, is often all that is needed to diagnose pharyngitis. Sometimes, a rapid strep test is done. Other lab tests may also be done, depending on the suspected cause.  TREATMENT  Viral pharyngitis will usually get better in 3-4 days without the use of medicine. Bacterial pharyngitis is treated with medicines that kill germs (antibiotics).  HOME CARE INSTRUCTIONS   Drink enough water and fluids to keep your urine clear or pale yellow.   Only take over-the-counter or prescription medicines as directed by your health care provider:   If you are prescribed antibiotics, make sure you finish them even if you start to feel better.   Do not take aspirin.   Get lots of rest.   Gargle with 8  oz of salt water ( tsp of salt per 1 qt of water) as often as every 1-2 hours to soothe your throat.   Throat lozenges (if you are not at risk for choking) or sprays may be used to soothe your throat. SEEK MEDICAL CARE IF:   You have large, tender lumps in your neck.  You have a rash.  You cough up green, yellow-brown, or bloody spit. SEEK IMMEDIATE MEDICAL CARE IF:   Your neck becomes stiff.  You drool or are unable to swallow liquids.  You vomit or are unable to keep medicines or liquids down.  You have severe pain that does not go away with the use of recommended medicines.  You have trouble breathing (not caused by a stuffy nose). MAKE SURE YOU:   Understand these  instructions.  Will watch your condition.  Will get help right away if you are not doing well or get worse. Document Released: 06/12/2005 Document Revised: 04/02/2013 Document Reviewed: 02/17/2013 [facility] Patient Information 2015 [place], [place]. This information is not intended to replace advice given to you by your health care provider. Make sure you discuss any questions you have with your health care provider.

## 2016-03-10 NOTE — Progress Notes (Signed)
   Subjective:    Patient ID: Tonya [name], female    DOB: 20-May-1986, 30 y.o.   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 She presents today with continuing symptoms of fever/sore throat. She never took prednisone.  Had temp 102 at home, nagging cough, body aches, sore throat.   Blood pressure 124/70, pulse (!) 122, temperature 97.7 F (36.5 C), resp. rate 18, height 5' 5.5" (1.664 m), weight 177 lb (80.3 kg), last menstrual period 02/25/2016, SpO2 98 %.  Medications Current Outpatient Prescriptions on File Prior to Visit  Medication Sig  . amphetamine-dextroamphetamine (ADDERALL) 20 MG tablet Take 1 to 2 tablets prn for focus no more than 5 days per week  . norethindrone-ethinyl estradiol-iron (MICROGESTIN FE,GILDESS FE,LOESTRIN FE) 1.5-30 MG-MCG tablet Take 1 tablet by mouth daily.  . predniSONE (DELTASONE) 20 MG tablet 2 tablets daily for 3 days, 1 tablet daily for 4 days. (Patient not taking: Reported on 03/10/2016)   No current facility-administered medications on file prior to visit.     Problem list She has Anxiety; ADD (attention deficit disorder); Anemia; and Hyperlipidemia on her problem list.   Review of Systems  Constitutional: Positive for fever. Negative for chills and diaphoresis.  HENT: Positive for rhinorrhea and sore throat. Negative for congestion, ear pain, sinus pressure and sneezing.   Respiratory: Negative.  Negative for cough and shortness of breath.   Cardiovascular: Negative.   Musculoskeletal: Positive for myalgias. Negative for arthralgias and neck pain.  Neurological: Negative.  Negative for headaches.       Objective:   Physical Exam  Constitutional: She appears well-developed  and well-nourished.  HENT:  Head: Normocephalic and atraumatic.  Right Ear: External ear normal.  Left Ear: External ear normal.  Mouth/Throat: Uvula is midline and mucous membranes are normal. Posterior oropharyngeal erythema present. No oropharyngeal exudate or posterior oropharyngeal edema.  Eyes: Conjunctivae and EOM are normal. Pupils are equal, round, and reactive to light.  Neck: Normal range of motion. Neck supple. No Brudzinski's sign and no Kernig's sign noted.  Cardiovascular: Normal rate, regular rhythm and normal heart sounds.   Pulmonary/Chest: Effort normal and breath sounds normal.  Abdominal: Soft. Bowel sounds are normal.  Lymphadenopathy:    She has no cervical adenopathy.  Skin: Skin is warm and dry.       Assessment & Plan:  1. Fever, unspecified - CBC with Differential/Platelet - Comprehensive metabolic panel - POC Influenza A&B(BINAX/QUICKVUE)- NEGATIVE - ketorolac (TORADOL) injection 60 mg; Inject 2 mLs (60 mg total) into the muscle once. - Epstein-Barr virus VCA antibody panel - if worsening symptoms over the weekend go to ER

## 2016-03-12 LAB — EPSTEIN-BARR VIRUS VCA ANTIBODY PANEL
EBV NA IGG: 117 U/mL — AB
EBV VCA IGG: 687 U/mL — AB
EBV VCA IgM: <36 U/mL

## 2016-03-13 ENCOUNTER — Encounter: Payer: Self-pay | Admitting: Physician Assistant

## 2016-03-13 ENCOUNTER — Ambulatory Visit (INDEPENDENT_AMBULATORY_CARE_PROVIDER_SITE_OTHER): Payer: 59 | Admitting: Physician Assistant

## 2016-03-13 VITALS — BP 126/84 | HR 103 | Temp 97.0°F | Resp 16 | Ht 65.0 in | Wt 177.0 lb

## 2016-03-13 DIAGNOSIS — R05 Cough: Secondary | ICD-10-CM

## 2016-03-13 DIAGNOSIS — R059 Cough, unspecified: Secondary | ICD-10-CM

## 2016-03-13 DIAGNOSIS — R7989 Other specified abnormal findings of blood chemistry: Secondary | ICD-10-CM | POA: Diagnosis not present

## 2016-03-13 DIAGNOSIS — R945 Abnormal results of liver function studies: Principal | ICD-10-CM

## 2016-03-13 LAB — CBC WITH DIFFERENTIAL/PLATELET
BASOS PCT: 0 %
Basophils Absolute: 0 cells/uL (ref 0–200)
EOS PCT: 1 %
Eosinophils Absolute: 55 cells/uL (ref 15–500)
HCT: 37.7 % (ref 35.0–45.0)
Hemoglobin: 12.7 g/dL (ref 11.7–15.5)
LYMPHS PCT: 30 %
Lymphs Abs: 1650 cells/uL (ref 850–3900)
MCH: 29.7 pg (ref 27.0–33.0)
MCHC: 33.7 g/dL (ref 32.0–36.0)
MCV: 88.3 fL (ref 80.0–100.0)
MONOS PCT: 10 %
MPV: 9.3 fL (ref 7.5–12.5)
Monocytes Absolute: 550 cells/uL (ref 200–950)
NEUTROS ABS: 3245 {cells}/uL (ref 1500–7800)
Neutrophils Relative %: 59 %
PLATELETS: 246 10*3/uL (ref 140–400)
RBC: 4.27 MIL/uL (ref 3.80–5.10)
RDW: 12.8 % (ref 11.0–15.0)
WBC: 5.5 10*3/uL (ref 3.8–10.8)

## 2016-03-13 LAB — HEPATITIS PANEL, ACUTE
HCV AB: NEGATIVE
HEP A IGM: NONREACTIVE
HEP B C IGM: NONREACTIVE
Hepatitis B Surface Ag: NEGATIVE

## 2016-03-13 LAB — HEPATIC FUNCTION PANEL
ALBUMIN: 4 g/dL (ref 3.6–5.1)
ALK PHOS: 78 U/L (ref 33–115)
ALT: 122 U/L — ABNORMAL HIGH (ref 6–29)
AST: 38 U/L — AB (ref 10–30)
BILIRUBIN DIRECT: 0.1 mg/dL (ref <=0.2)
BILIRUBIN TOTAL: 0.4 mg/dL (ref 0.2–1.2)
Indirect Bilirubin: 0.3 mg/dL (ref 0.2–1.2)
Total Protein: 6.3 g/dL (ref 6.1–8.1)

## 2016-03-13 LAB — BASIC METABOLIC PANEL WITH GFR
BUN: 12 mg/dL (ref 7–25)
CHLORIDE: 101 mmol/L (ref 98–110)
CO2: 30 mmol/L (ref 20–31)
CREATININE: 0.66 mg/dL (ref 0.50–1.10)
Calcium: 9.4 mg/dL (ref 8.6–10.2)
GFR, Est Non African American: >89 mL/min (ref >=60)
Glucose, Bld: 123 mg/dL — ABNORMAL HIGH (ref 65–99)
POTASSIUM: 3.6 mmol/L (ref 3.5–5.3)
SODIUM: 142 mmol/L (ref 135–146)

## 2016-03-13 MED ORDER — BENZONATATE 100 MG PO CAPS: 200.0000 mg | ORAL_CAPSULE | Freq: Three times a day (TID) | ORAL | 0 refills | Status: DC | PRN

## 2016-03-13 MED ORDER — ALBUTEROL SULFATE HFA 108 (90 BASE) MCG/ACT IN AERS: 2.0000 | INHALATION_SPRAY | Freq: Four times a day (QID) | RESPIRATORY_TRACT | 2 refills | Status: DC | PRN

## 2016-03-13 MED FILL — VENTOLIN HFA 90 MCG INHALER: 108 (90 BAS | 25 days supply | Qty: 18 | Fill #0

## 2016-03-13 MED FILL — BENZONATATE 100 MG CAPSULE: 100 | 10 days supply | Qty: 60 | Fill #0

## 2016-03-13 NOTE — Progress Notes (Signed)
Subjective:    Patient ID: Tonya [name], female    DOB: [birth year], 30 y.o.   MRN: [id number]  HPI 30 y.o. WF presents for follow up for elevated LFTs. She was seen 9/12 and 9/15 for fever, cold symptoms (sore throat, dry cough, myalgias). She had negative flu, negative strep, had negative mono, her LFTs did go from benign to 200's, she was taking tylenol 1000. She was told to stop any tylenol over the weekend and presents for recheck of LFTs . She was given toradol shot in the office, declines dexamethasone. She continue to cough with some SOB, fever but states over all she has improved.  Lab Results  Component Value Date   ALT 204 (H) 03/10/2016   ALT 31 (H) 03/07/2016    Blood pressure 126/84, pulse (!) 103, temperature 97 F (36.1 C), resp. rate 16, height 5\' 5"  (1.651 m), weight 177 lb (80.3 kg), last menstrual period 02/25/2016, SpO2 97 %.  Medications Current Outpatient Prescriptions on File Prior to Visit  Medication Sig  . amphetamine-dextroamphetamine (ADDERALL) 20 MG tablet Take 1 to 2 tablets prn for focus no more than 5 days per week  . norethindrone-ethinyl estradiol-iron (MICROGESTIN FE,GILDESS FE,LOESTRIN FE) 1.5-30 MG-MCG tablet Take 1 tablet by mouth daily.  . predniSONE (DELTASONE) 20 MG tablet 2 tablets daily for 3 days, 1 tablet daily for 4 days.  . promethazine-codeine (PHENERGAN WITH CODEINE) 6.25-10 MG/5ML syrup Take 5 mLs by mouth every 6 (six) hours as needed for cough. Max: 14mL per day  . azithromycin (ZITHROMAX) 250 MG tablet Take 2 tablets (500 mg) on  Day 1,  followed by 1 tablet (250 mg) once daily on Days 2 through 5. (Patient not taking: Reported on 03/13/2016)   No current facility-administered medications on file prior to visit.     Problem list She has Anxiety; ADD (attention deficit disorder); Anemia; and Hyperlipidemia on her problem list.  Review of Systems  Constitutional: Positive for fever. Negative for chills and diaphoresis.  HENT:  Positive for rhinorrhea and sore throat. Negative for congestion, ear pain, sinus pressure and sneezing.   Respiratory: Negative.  Negative for cough and shortness of breath.   Cardiovascular: Negative.   Musculoskeletal: Positive for myalgias. Negative for arthralgias and neck pain.  Neurological: Negative.  Negative for headaches.       Objective:   Physical Exam  Constitutional: She is oriented to person, place, and time. She appears well-developed and well-nourished.  HENT:  Head: Normocephalic and atraumatic.  Right Ear: External ear normal.  Left Ear: External ear normal.  Nose: Nose normal.  Mouth/Throat: Oropharynx is clear and moist.  Eyes: Conjunctivae are normal. Pupils are equal, round, and reactive to light.  Neck: Normal range of motion. Neck supple.  Cardiovascular: Normal rate and regular rhythm.   Pulmonary/Chest: Effort normal. No respiratory distress. She has wheezes. She has no rales. She exhibits no tenderness.  Abdominal: Soft. Bowel sounds are normal. There is no tenderness. There is no rebound and no guarding.  Lymphadenopathy:    She has no cervical adenopathy.  Neurological: She is alert and oriented to person, place, and time.  Skin: Skin is warm and dry.       Assessment & Plan:  1. Elevated liver function tests Has stopped tylenol, if not better may need further testing.  - CBC with Differential/Platelet - BASIC METABOLIC PANEL WITH GFR - Hepatic function panel - Hepatitis Acute Panel  2. Cough Will get CXR if not better,  start zpak that was sent in - DG Chest 2 View - benzonatate (TESSALON PERLES) 100 MG capsule; Take 2 capsules (200 mg total) by mouth 3 (three) times daily as needed for cough (Max: 600mg  per day).  Dispense: 60 capsule; Refill: 0 - albuterol (PROVENTIL HFA;VENTOLIN HFA) 108 (90 Base) MCG/ACT inhaler; Inhale 2 puffs into the lungs every 6 (six) hours as needed for wheezing or shortness of breath.  Dispense: 1 Inhaler; Refill:  2

## 2016-03-14 ENCOUNTER — Other Ambulatory Visit: Payer: Self-pay | Admitting: Physician Assistant

## 2016-03-14 DIAGNOSIS — R7989 Other specified abnormal findings of blood chemistry: Secondary | ICD-10-CM

## 2016-03-14 DIAGNOSIS — R945 Abnormal results of liver function studies: Principal | ICD-10-CM

## 2016-03-16 ENCOUNTER — Encounter: Payer: Self-pay | Admitting: Internal Medicine

## 2016-03-27 ENCOUNTER — Other Ambulatory Visit: Payer: Self-pay | Admitting: *Deleted

## 2016-03-27 MED ORDER — AMPHETAMINE-DEXTROAMPHETAMINE 20 MG PO TABS: ORAL_TABLET | ORAL | 0 refills | Status: DC

## 2016-03-28 MED FILL — DEXTROAMP-AMPHETAMIN 20 MG: 20 | 20 days supply | Qty: 40 | Fill #0

## 2016-04-25 ENCOUNTER — Encounter: Payer: Self-pay | Admitting: Physician Assistant

## 2016-04-25 ENCOUNTER — Ambulatory Visit (INDEPENDENT_AMBULATORY_CARE_PROVIDER_SITE_OTHER): Payer: 59 | Admitting: Physician Assistant

## 2016-04-25 VITALS — BP 124/80 | HR 89 | Temp 97.9°F | Resp 16 | Ht 69.0 in | Wt 180.0 lb

## 2016-04-25 DIAGNOSIS — Z3201 Encounter for pregnancy test, result positive: Secondary | ICD-10-CM

## 2016-04-25 DIAGNOSIS — R945 Abnormal results of liver function studies: Secondary | ICD-10-CM

## 2016-04-25 DIAGNOSIS — R350 Frequency of micturition: Secondary | ICD-10-CM

## 2016-04-25 DIAGNOSIS — Z8614 Personal history of Methicillin resistant Staphylococcus aureus infection: Secondary | ICD-10-CM

## 2016-04-25 DIAGNOSIS — R7989 Other specified abnormal findings of blood chemistry: Secondary | ICD-10-CM

## 2016-04-25 LAB — FERRITIN: Ferritin: 38 ng/mL (ref 10–154)

## 2016-04-25 LAB — HEPATIC FUNCTION PANEL
ALBUMIN: 4.7 g/dL (ref 3.6–5.1)
ALT: 17 U/L (ref 6–29)
AST: 14 U/L (ref 10–30)
Alkaline Phosphatase: 37 U/L (ref 33–115)
BILIRUBIN INDIRECT: 0.3 mg/dL (ref 0.2–1.2)
BILIRUBIN TOTAL: 0.4 mg/dL (ref 0.2–1.2)
Bilirubin, Direct: 0.1 mg/dL (ref <=0.2)
TOTAL PROTEIN: 6.7 g/dL (ref 6.1–8.1)

## 2016-04-25 LAB — POCT URINE PREGNANCY: PREG TEST UR: POSITIVE — AB

## 2016-04-25 NOTE — Progress Notes (Signed)
   Subjective:    Patient ID: Tonya [name], female    DOB: [birth year], 30 y.o.   MRN: [id number]  HPI 30 y.o. WF presents with possible pregnancy test, has been off BCP x 2 months, married, has been actively trying x last 2 months, missed menses end of sept but had negative preg test oct 2nd, then retook this week and it was +, started on prenatals. She also had elevated LFT last visit due to presumed virus. She also has had frequency x 1 week, no blood, no fever or chills. No N, V, or AB pain. Had some mild cramping and brown discharge Sunday.   Lab Results  Component Value Date   ALT 122 (H) 03/13/2016   AST 38 (H) 03/13/2016   ALKPHOS 78 03/13/2016   BILITOT 0.4 03/13/2016   Had + MRSA abscess in June, needs nasal swab to see if she is carrier.   Medications No current outpatient prescriptions on file prior to visit.   No current facility-administered medications on file prior to visit.     Problem list She has Anxiety; ADD (attention deficit disorder); Anemia; and Hyperlipidemia on her problem list.  Review of Systems  Constitutional: Negative.   HENT: Negative.   Respiratory: Negative.   Cardiovascular: Negative.   Gastrointestinal: Negative.   Genitourinary: Negative.   Musculoskeletal: Negative.   Skin: Negative.   Neurological: Negative.   Hematological: Negative.   Psychiatric/Behavioral: Negative.        Objective:   Physical Exam  Constitutional: She is oriented to person, place, and time. She appears well-developed and well-nourished.  HENT:  Head: Normocephalic and atraumatic.  Right Ear: External ear normal.  Left Ear: External ear normal.  Mouth/Throat: Oropharynx is clear and moist.  Eyes: Conjunctivae and EOM are normal. Pupils are equal, round, and reactive to light.  Neck: Normal range of motion. Neck supple. No thyromegaly present.  Cardiovascular: Normal rate, regular rhythm and normal heart sounds.  Exam reveals no gallop and no friction  rub.   No murmur heard. Pulmonary/Chest: Effort normal and breath sounds normal. No respiratory distress. She has no wheezes.  Abdominal: Soft. Bowel sounds are normal. She exhibits no distension and no mass. There is no tenderness. There is no rebound and no guarding.  Musculoskeletal: Normal range of motion.  Lymphadenopathy:    She has no cervical adenopathy.  Neurological: She is alert and oriented to person, place, and time. She displays normal reflexes. No cranial nerve deficit. Coordination normal.  Skin: Skin is warm and dry.  Psychiatric: She has a normal mood and affect.      Assessment & Plan:  1. Pregnancy test performed, pregnancy confirmed - POCT urine pregnancy- positive - hCG, quantitative, pregnancy- will get quant since some question of possibly how far along she is - Ambulatory referral to Obstetrics / Gynecology She states her GYN is not OB and needs one associated with [facility] since she is an employee  2. Elevated liver function tests - Hepatic function panel - Anti-DNA antibody, double-stranded - ANA - Mitochondrial antibodies - Alpha-1-antitrypsin - Ferritin - Anti-smooth muscle antibody, IgG - Copper, Free  3. Urinary frequency Check urine  4. Personal history of MRSA (methicillin resistant Staphylococcus aureus) - MRSA culture

## 2016-04-26 ENCOUNTER — Other Ambulatory Visit: Payer: Self-pay | Admitting: Physician Assistant

## 2016-04-26 LAB — URINALYSIS, ROUTINE W REFLEX MICROSCOPIC
Bilirubin Urine: NEGATIVE
Glucose, UA: NEGATIVE
KETONES UR: NEGATIVE
NITRITE: POSITIVE — AB
SPECIFIC GRAVITY, URINE: 1.029 (ref 1.001–1.035)
pH: 5.5 (ref 5.0–8.0)

## 2016-04-26 LAB — URINALYSIS, MICROSCOPIC ONLY
CRYSTALS: NONE SEEN [HPF]
Casts: NONE SEEN [LPF]
Yeast: NONE SEEN [HPF]

## 2016-04-26 LAB — ANA: ANA: NEGATIVE

## 2016-04-26 LAB — ANTI-DNA ANTIBODY, DOUBLE-STRANDED: ds DNA Ab: <1 IU/mL

## 2016-04-26 LAB — HCG, QUANTITATIVE, PREGNANCY: hCG, Beta Chain, Quant, S: 19476.5 m[IU]/mL — ABNORMAL HIGH

## 2016-04-26 LAB — ANTI-SMOOTH MUSCLE ANTIBODY, IGG: Smooth Muscle Ab: <20 U (ref <20)

## 2016-04-26 MED ORDER — AMOXICILLIN-POT CLAVULANATE 875-125 MG PO TABS: 1.0000 | ORAL_TABLET | Freq: Two times a day (BID) | ORAL | 0 refills | Status: DC

## 2016-04-26 MED FILL — AMOX TR-K CLV 875-125 MG TA: 875-125 | 3 days supply | Qty: 6 | Fill #0

## 2016-04-27 LAB — URINE CULTURE: Colony Count: >=100000

## 2016-04-27 LAB — MITOCHONDRIAL ANTIBODIES

## 2016-04-27 LAB — MRSA CULTURE

## 2016-04-27 LAB — ALPHA-1-ANTITRYPSIN: A1 ANTITRYPSIN SER: 163 mg/dL (ref 83–199)

## 2016-05-05 LAB — COPPER, FREE: Copper - Free, Serum/Plasma: 740 mcg/L

## 2016-05-08 DIAGNOSIS — N911 Secondary amenorrhea: Secondary | ICD-10-CM | POA: Diagnosis not present

## 2016-05-15 DIAGNOSIS — Z3401 Encounter for supervision of normal first pregnancy, first trimester: Secondary | ICD-10-CM | POA: Diagnosis not present

## 2016-05-25 ENCOUNTER — Ambulatory Visit (INDEPENDENT_AMBULATORY_CARE_PROVIDER_SITE_OTHER): Payer: 59 | Admitting: Internal Medicine

## 2016-05-25 ENCOUNTER — Encounter: Payer: Self-pay | Admitting: Internal Medicine

## 2016-05-25 VITALS — BP 108/66 | HR 111 | Temp 98.1°F | Wt 180.0 lb

## 2016-05-25 DIAGNOSIS — Z0000 Encounter for general adult medical examination without abnormal findings: Secondary | ICD-10-CM

## 2016-05-25 DIAGNOSIS — E782 Mixed hyperlipidemia: Secondary | ICD-10-CM

## 2016-05-25 DIAGNOSIS — Z3401 Encounter for supervision of normal first pregnancy, first trimester: Secondary | ICD-10-CM

## 2016-05-25 DIAGNOSIS — F988 Other specified behavioral and emotional disorders with onset usually occurring in childhood and adolescence: Secondary | ICD-10-CM

## 2016-05-25 NOTE — Progress Notes (Signed)
Annual Screening Comprehensive Examination   This very nice 30 y.o.female presents for complete physical.  Patient has no major health issues.  Patient reports no complaints at this time.   She is currently [redacted] weeks pregnant with her first child.  She is following with [facility].  She just had her first prenatal appointment with screening laboratories approximately 2 weeks ago.  She reports that she has been feeling well.  She asks that we look at her labs from her obgyn appointment.  She is not currently on her ADD medications secondary to pregnancy.    She is having some mild congestion.  She is not currently taking anything for this.  Finally, patient has history of Vitamin D Deficiency and last vitamin D was  Lab Results  Component Value Date   VD25OH 34 05/31/2015  .  Currently on supplementation     No current outpatient prescriptions on file prior to visit.   No current facility-administered medications on file prior to visit.     No Known Allergies  Past Medical History:  Diagnosis Date  . ADD (attention deficit disorder)   . Anemia   . Anxiety   . Hyperlipidemia     Immunization History  Administered Date(s) Administered  . Influenza Split 04/17/2013    No past surgical history on file.  Family History  Problem Relation Age of Onset  . Hypertension Mother   . Depression Mother   . Hypertension Father   . Cancer Father     prostate    Social History   Social History  . Marital status: Single    Spouse name: N/A  . Number of children: N/A  . Years of education: N/A   Occupational History  . Not on file.   Social History Main Topics  . Smoking status: Never Smoker  . Smokeless tobacco: Not on file  . Alcohol use Not on file  . Drug use: Unknown  . Sexual activity: Not on file   Other Topics Concern  . Not on file   Social History Narrative  . No narrative on file   Review of Systems  Constitutional: Negative for chills,  fever and malaise/fatigue.  HENT: Negative for congestion, ear pain and sore throat.   Eyes: Negative.   Respiratory: Negative for cough, shortness of breath and wheezing.   Cardiovascular: Negative for chest pain, palpitations and leg swelling.  Gastrointestinal: Negative for abdominal pain, blood in stool, constipation, diarrhea, heartburn and melena.  Genitourinary: Negative.   Skin: Negative.   Neurological: Negative for dizziness, sensory change, loss of consciousness and headaches.  Psychiatric/Behavioral: Negative for depression. The patient is not nervous/anxious and does not have insomnia.       Physical Exam  BP 108/66   Pulse (!) 111   Temp 98.1 F (36.7 C)   Wt 180 lb (81.6 kg)   LMP 02/24/2016   SpO2 98%   BMI 26.58 kg/m   General Appearance: Well nourished and in no apparent distress. Eyes: PERRLA, EOMs, conjunctiva no swelling or erythema, normal fundi and vessels. Sinuses: No frontal/maxillary tenderness ENT/Mouth: EACs patent / TMs  nl. Nares clear without erythema, swelling, mucoid exudates. Oral hygiene is good. No erythema, swelling, or exudate. Tongue normal, non-obstructing. Tonsils not swollen or erythematous. Hearing normal.  Neck: Supple, thyroid normal. No bruits, nodes or JVD. Respiratory: Respiratory effort normal.  BS equal and clear bilateral without rales, rhonci, wheezing or stridor. Cardio: Heart sounds are normal with regular rate and  rhythm and no murmurs, rubs or gallops. Peripheral pulses are normal and equal bilaterally without edema. No aortic or femoral bruits. Chest: symmetric with normal excursions and percussion. Abdomen: Flat, soft, with bowl sounds. Nontender, no guarding, rebound, hernias, masses, or organomegaly.  Lymphatics: Non tender without lymphadenopathy.  Musculoskeletal: Full ROM all peripheral extremities, joint stability, 5/5 strength, and normal gait. Skin: Warm and dry without rashes, lesions, cyanosis, clubbing or   ecchymosis.  Neuro: Cranial nerves intact, reflexes equal bilaterally. Normal muscle tone, no cerebellar symptoms. Sensation intact.  Pysch: Awake and oriented X 3, normal affect, Insight and Judgment appropriate.   Assessment and Plan    1. Routine general medical examination at a health care facility -screening labs done at Obgyn office for prenatal visit -will have them fax copy over to our office  2. Attention deficit disorder (ADD) without hyperactivity -not currently on meds due to pregnancy -will not restart until done breast feeding  3. Primigravida in first trimester -pregnant with first child -[facility] -will be available if needed   4. Mixed hyperlipidemia -cont healthy diet and exercise    Continue prudent diet as discussed, weight control, regular exercise, and medications. Routine screening labs and tests as requested with regular follow-up as recommended.  Over 40 minutes of exam, counseling, chart review and critical decision making was performed

## 2016-05-29 DIAGNOSIS — Z3401 Encounter for supervision of normal first pregnancy, first trimester: Secondary | ICD-10-CM | POA: Diagnosis not present

## 2016-05-29 DIAGNOSIS — Z36 Encounter for antenatal screening for chromosomal anomalies: Secondary | ICD-10-CM | POA: Diagnosis not present

## 2016-06-23 DIAGNOSIS — H5213 Myopia, bilateral: Secondary | ICD-10-CM | POA: Diagnosis not present

## 2016-07-24 DIAGNOSIS — Z362 Encounter for other antenatal screening follow-up: Secondary | ICD-10-CM | POA: Diagnosis not present

## 2016-07-24 DIAGNOSIS — Z3400 Encounter for supervision of normal first pregnancy, unspecified trimester: Secondary | ICD-10-CM | POA: Diagnosis not present

## 2016-08-24 DIAGNOSIS — Z362 Encounter for other antenatal screening follow-up: Secondary | ICD-10-CM | POA: Diagnosis not present

## 2016-08-24 DIAGNOSIS — Z3400 Encounter for supervision of normal first pregnancy, unspecified trimester: Secondary | ICD-10-CM | POA: Diagnosis not present

## 2016-09-15 ENCOUNTER — Ambulatory Visit (HOSPITAL_COMMUNITY)
Admission: RE | Admit: 2016-09-15 | Discharge: 2016-09-15 | Disposition: A | Discharge status: Home / Self Care | Payer: 59 | Source: Ambulatory Visit | Attending: Internal Medicine | Admitting: Internal Medicine

## 2016-09-15 ENCOUNTER — Other Ambulatory Visit (HOSPITAL_COMMUNITY): Payer: Self-pay | Admitting: Obstetrics and Gynecology

## 2016-09-15 DIAGNOSIS — M7989 Other specified soft tissue disorders: Secondary | ICD-10-CM | POA: Insufficient documentation

## 2016-09-15 DIAGNOSIS — M79661 Pain in right lower leg: Secondary | ICD-10-CM | POA: Insufficient documentation

## 2016-09-15 NOTE — Progress Notes (Signed)
*  Preliminary Results* Right lower extremity venous duplex completed. Right lower extremity is negative for deep vein thrombosis. There is no evidence of right Baker's cyst.  09/15/2016 1:41 PM  Maudry Mayhew, BS, RVT, RDCS, RDMS

## 2016-09-20 DIAGNOSIS — Z6791 Unspecified blood type, Rh negative: Secondary | ICD-10-CM | POA: Diagnosis not present

## 2016-09-20 DIAGNOSIS — Z23 Encounter for immunization: Secondary | ICD-10-CM | POA: Diagnosis not present

## 2016-09-20 DIAGNOSIS — M79651 Pain in right thigh: Secondary | ICD-10-CM | POA: Diagnosis not present

## 2016-09-20 DIAGNOSIS — Z3480 Encounter for supervision of other normal pregnancy, unspecified trimester: Secondary | ICD-10-CM | POA: Diagnosis not present

## 2016-09-20 DIAGNOSIS — Z3403 Encounter for supervision of normal first pregnancy, third trimester: Secondary | ICD-10-CM | POA: Diagnosis not present

## 2016-09-27 DIAGNOSIS — O360930 Maternal care for other rhesus isoimmunization, third trimester, not applicable or unspecified: Secondary | ICD-10-CM | POA: Diagnosis not present

## 2016-09-27 DIAGNOSIS — Z3A29 29 weeks gestation of pregnancy: Secondary | ICD-10-CM | POA: Diagnosis not present

## 2016-09-27 DIAGNOSIS — O99810 Abnormal glucose complicating pregnancy: Secondary | ICD-10-CM | POA: Diagnosis not present

## 2016-10-11 ENCOUNTER — Encounter: Payer: 59 | Attending: Obstetrics & Gynecology

## 2016-10-11 DIAGNOSIS — Z713 Dietary counseling and surveillance: Secondary | ICD-10-CM | POA: Insufficient documentation

## 2016-10-11 DIAGNOSIS — O24419 Gestational diabetes mellitus in pregnancy, unspecified control: Secondary | ICD-10-CM | POA: Insufficient documentation

## 2016-10-11 DIAGNOSIS — R7309 Other abnormal glucose: Secondary | ICD-10-CM

## 2016-10-13 MED FILL — FREESTYLE LITE TEST STRIP: 25 days supply | Qty: 100 | Fill #0

## 2016-10-13 MED FILL — FREESTYLE LANCETS: 25 days supply | Qty: 100 | Fill #0

## 2016-10-13 MED FILL — FREESTYLE LITE METER: 30 days supply | Qty: 1 | Fill #0

## 2016-10-17 NOTE — Progress Notes (Signed)
  Patient was seen on 10/11/2016 for Gestational Diabetes self-management . The following learning objectives were met by the patient :   States the definition of Gestational Diabetes  States why dietary management is important in controlling blood glucose  Describes the effects of carbohydrates on blood glucose levels  Demonstrates ability to create a balanced meal plan  Demonstrates carbohydrate counting   States when to check blood glucose levels  Demonstrates proper blood glucose monitoring techniques  States the effect of stress and exercise on blood glucose levels  States the importance of limiting caffeine and abstaining from alcohol and smoking  Plan:  Aim for 3 Carb Choices per meal (45 grams) +/- 1 either way  Aim for 1-2 Carbs per snack Begin reading food labels for Total Carbohydrate of foods Consider  increasing your activity level by walking or other activity daily as tolerated Begin checking BG before breakfast and 2 hours after first bite of breakfast, lunch and dinner as directed by MD  Take medication if directed by MD  Pt instructed to call MD office for  Rx to be called into pharmacy for meter covered by [facility] insurance Patient instructed to test pre breakfast and 2 hours each meal as directed by MD Bring Log Book to every medical appointment   Patient instructed to monitor glucose levels: FBS: 60 - <90 2 hour: <120   Patient received the following handouts:  Nutrition Diabetes and Pregnancy  Carbohydrate Counting List  Patient will be seen for follow-up as needed.

## 2016-10-23 DIAGNOSIS — M79651 Pain in right thigh: Secondary | ICD-10-CM | POA: Diagnosis not present

## 2016-11-06 ENCOUNTER — Encounter: Payer: Self-pay | Admitting: Rehabilitative and Restorative Service Providers"

## 2016-11-06 ENCOUNTER — Ambulatory Visit (INDEPENDENT_AMBULATORY_CARE_PROVIDER_SITE_OTHER): Payer: 59 | Admitting: Rehabilitative and Restorative Service Providers"

## 2016-11-06 DIAGNOSIS — R2689 Other abnormalities of gait and mobility: Secondary | ICD-10-CM

## 2016-11-06 DIAGNOSIS — M25551 Pain in right hip: Secondary | ICD-10-CM | POA: Diagnosis not present

## 2016-11-06 DIAGNOSIS — R29898 Other symptoms and signs involving the musculoskeletal system: Secondary | ICD-10-CM | POA: Diagnosis not present

## 2016-11-06 LAB — OB RESULTS CONSOLE GBS: STREP GROUP B AG: POSITIVE

## 2016-11-06 MED FILL — FREESTYLE LITE TEST STRIP: 25 days supply | Qty: 100 | Fill #1

## 2016-11-06 MED FILL — FREESTYLE LANCETS: 25 days supply | Qty: 100 | Fill #1

## 2016-11-06 NOTE — Therapy (Signed)
Tonya [name], [place], [address] Phone: [phone]   Fax:  [phone]  Physical Therapy Evaluation  Patient Details  Name: Tonya [name] MRN: [id number] Date of Birth: [birth year] Referring Provider: Dr Marchia Bond   Encounter Date: 11/06/2016      PT End of Session - 11/06/16 1321    Visit Number 1   Number of Visits 18   Date for PT Re-Evaluation 12/18/16   PT Start Time 0939   PT Stop Time 1035   PT Time Calculation (min) 56 min   Activity Tolerance Patient tolerated treatment well      Past Medical History:  Diagnosis Date  . ADD (attention deficit disorder)   . Anemia   . Anxiety   . Hyperlipidemia     History reviewed. No pertinent surgical history.  There were no vitals filed for this visit.       Subjective Assessment - 11/06/16 0945    Subjective Patient reports that she has been having IT band tightness and pain which started 09/09/16 when she was claening her house. She was seen by MD and diagnosed with IT band tightness the following week.  She has difficulty with any functional activity including getting in and out of her car; standing; walking; sitting.  35 weeks in pregnancy - first pregnancy.  No trouble until 09/09/16.  Moving the end of May - bought a new home.    Pertinent History Gestational diabetes    How long can you sit comfortably? 1-2 hours    How long can you stand comfortably? several minutes standing still    How long can you walk comfortably? every step is painful    Patient Stated Goals get rid og pain and be able to walk with less pain    Currently in Pain? Yes   Pain Score 8    Pain Location Hip   Pain Orientation Right   Pain Descriptors / Indicators Sharp;Stabbing   Pain Type Acute pain   Pain Radiating Towards down the lateral thigh into the knee and lateral calf into the Rt side of the foot    Pain Onset More than a month ago   Pain  Frequency Intermittent   Aggravating Factors  walking; getting in and out of car of standing from sitting; stairs; getting out of the floor    Pain Relieving Factors rest; avoiding the walking and activities that cause pain             [facility] PT Assessment - 11/06/16 0001      Assessment   Medical Diagnosis Rt IT band and hip dysfunction   Referring Provider Dr Marchia Bond    Onset Date/Surgical Date 09/09/16   Hand Dominance Right   Next MD Visit 6/18   Prior Therapy none      Precautions   Precautions None     Balance Screen   Has the patient fallen in the past 6 months No   Has the patient had a decrease in activity level because of a fear of falling?  No   Is the patient reluctant to leave their home because of a fear of falling?  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    Leisure household chores; walking the dog; running errands      Sensation   Additional Comments WFL's per pt report      Posture/Postural Control   Posture Comments head forward; shoudlers rounded; flexed frw at hips; Rt LE ER; wt shifted to Lt      AROM   Right/Left Hip --  limited end ranges Rt > Lt    Lumbar Flexion 50%  pulling posterior thigh    Lumbar Extension 55%   Lumbar - Right Side Bend 70%   Lumbar - Left Side Bend 70%   Lumbar - Right Rotation 50%   Lumbar - Left Rotation 50%      Strength   Overall Strength Comments functional strength bilat LE's pain with resistive testing Rt hip abduction - pain into the Rt lateral knee      Flexibility   Hamstrings Rt 70 deg; Lt 75 deg    Quadriceps tight bilat    ITB tight Rt    Piriformis tight Rt      Palpation   Palpation comment tight Rt posterior hip; IT band; lateral  thigh; lateral knee; lateral calf      Functional Gait  Assessment   Gait assessed  --  significant limp Rt LE with dec wt shift; Rt LE in ER                    [facility] Adult PT Treatment/Exercise - 11/06/16 0001      Therapeutic Activites    Therapeutic Activities --  instructed in myofacial release with ball/massage stick      Knee/Hip Exercises: Stretches   Passive Hamstring Stretch Right;2 reps;30 seconds  supine with strap    ITB Stretch 2 reps;30 seconds  supine with strap   Piriformis Stretch 2 reps;30 seconds  supine - travell      Moist Heat Therapy   Number Minutes Moist Heat 20 Minutes   Moist Heat Location Hip  Rt lateral hip, thigh and leg                 PT Education - 11/06/16 1021    Education provided Yes   Education Details HEP; myofacial pain    Person(s) Educated Patient   Methods Explanation;Demonstration;Tactile cues;Verbal cues;Handout   Comprehension Verbalized understanding;Returned demonstration;Verbal cues required;Tactile cues required             PT Long Term Goals - 11/06/16 1328      PT LONG TERM GOAL #1   Title Decreased myofacial tightness and restrictions with palpation through the Rt hip/thigh/calf 08/20/16   Time 6   Period Weeks   Status New     PT LONG TERM GOAL #2   Title Increase standing and walking time allowing patient to walk for 15-20 min with minimal pain and discomfort 12/18/16   Time 6   Period Weeks   Status New     PT LONG TERM GOAL #3   Title Instruct patient in appropriate body mechanics and positioning to minimize pain and improve comfort through Rt LE 12/18/16   Time 6   Period Weeks   Status New     PT LONG TERM GOAL #4   Title Independent in HEP 12/18/16   Time 6   Period Weeks   Status New               Plan - 11/06/16 1322  Clinical Impression Statement [name] presents for low complexity evaluation of Rt hip and LE pain with onset 09/09/16 after cleaning her house.  She presents today with significant pain in the Rt hip and LE creating abnormal gait with poor posture and alignment and decresaed wt bearing Rt LE in stance phase of gait. She has limited trunk and LE ROM; muscular tightness through the Rt buttock, hip, lateral thigh/knee/leg. She has decreased functional abilities due to these factors. 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 She will benefit from PT to address porblems identified, allowing her to continue work and avoid continued musculoskeletal problems following delivery.    Rehab Potential Good   PT Frequency 3x / week   PT Duration 6 weeks   PT Treatment/Interventions Patient/family education;Neuromuscular re-education;ADLs/Self Care Home Management;Cryotherapy;Iontophoresis 4mg /ml Dexamethasone;Electrical Stimulation;Moist Heat;Ultrasound;Dry needling;Manual techniques;Therapeutic activities;Therapeutic exercise   PT Next Visit Plan address musculoskeletal and myofacial pain with appropriate stretching; active movement; manual work; Proofreader as indicated; Furniture conservator/restorer with Plan of Care Patient      Patient will benefit from skilled therapeutic intervention in order to improve the following deficits and impairments:  Postural dysfunction, Improper body mechanics, Pain, Increased fascial restricitons, Increased muscle spasms, Decreased activity tolerance, Abnormal gait  Visit Diagnosis: Pain in right hip - Plan: PT plan of care cert/re-cert  Other symptoms and signs involving the musculoskeletal system - Plan: PT plan of care cert/re-cert  Other abnormalities of gait and mobility - Plan: PT plan of care cert/re-cert     Problem List Patient Active Problem List   Diagnosis Date Noted  . Hyperlipidemia   . Anxiety   . ADD (attention deficit disorder)   . Anemia     Bucky Grigg Nilda Simmer PT, MPH  11/06/2016, 1:36 PM  [facility] [place], [place], [address] Phone: [phone]   Fax:  [phone]  Name: Tonya [name] MRN: [id number] Date of Birth: Nov 18, 1985

## 2016-11-06 NOTE — Patient Instructions (Signed)
HIP: Hamstrings - Supine   Place strap around foot. Raise leg up, keeping knee straight.  Bend opposite knee to protect back if indicated. Hold 30 seconds. 3 reps per set, 2-3 sets per day     Outer Hip Stretch: Reclined IT Band Stretch (Strap)   Strap around one foot, pull leg across body until you feel a pull or stretch, with shoulders on mat. Hold for 30 seconds. Repeat 3 times each leg. 2-3 times/day.  Piriformis Stretch   Lying on back, pull right knee toward opposite shoulder. Hold 30 seconds. Repeat 3 times. Do 2-3 sessions per day.   Quads / HF, Supine   Lie near edge of bed, pull both knees up toward chest. Hold one knee as you drop the other leg off the edge of the bed.  Relax hanging knee/can bend knee back if indicated. Hold 30 seconds. Repeat 3 times per session. Do 2-3 sessions per day.   Self massage using ~ 4 inch plastic ball Self massage with rolling pin; "the stick" or foam roller

## 2016-11-08 ENCOUNTER — Encounter: Payer: Self-pay | Admitting: *Deleted

## 2016-11-10 ENCOUNTER — Ambulatory Visit (INDEPENDENT_AMBULATORY_CARE_PROVIDER_SITE_OTHER): Payer: 59 | Admitting: Physical Therapy

## 2016-11-10 DIAGNOSIS — R29898 Other symptoms and signs involving the musculoskeletal system: Secondary | ICD-10-CM

## 2016-11-10 DIAGNOSIS — M25551 Pain in right hip: Secondary | ICD-10-CM

## 2016-11-10 DIAGNOSIS — R2689 Other abnormalities of gait and mobility: Secondary | ICD-10-CM

## 2016-11-10 NOTE — Therapy (Signed)
[place], [place], [address] Phone: [phone]   Fax:  [phone]  Physical Therapy Treatment  Patient Details  Name: Tonya [name] MRN: [id number] Date of Birth: Jul 23, 1985 Referring Provider: Dr. Marchia Bond  Encounter Date: 11/10/2016      PT End of Session - 11/10/16 0904    Visit Number 2   Number of Visits 18   Date for PT Re-Evaluation 12/18/16   PT Start Time 0802   PT Stop Time 0851   PT Time Calculation (min) 49 min      Past Medical History:  Diagnosis Date  . ADD (attention deficit disorder)   . Anemia   . Anxiety   . Hyperlipidemia     No past surgical history on file.  There were no vitals filed for this visit.      Subjective Assessment - 11/10/16 1257    Subjective Pt reports minimal change since last visit.  She has had husband rub her hip and she has done the stretches.  She is thinking of taking leave from work early due to hip pain.     Patient Stated Goals get rid og pain and be able to walk with less pain    Currently in Pain? Yes   Pain Score 8   only when moving RLE; minimal pain at rest.    Pain Location Hip   Pain Orientation Right;Lateral   Pain Descriptors / Indicators Sharp;Radiating   Aggravating Factors  getting in/out of car, walking; getting up from chairs   Pain Relieving Factors rest, avoiding walking.             [facility] PT Assessment - 11/10/16 0001      Assessment   Medical Diagnosis Rt IT band and hip dysfunction   Referring Provider Dr. Marchia Bond   Onset Date/Surgical Date 09/09/16   Hand Dominance Right   Next MD Visit 6/18   Prior Therapy none      Palpation   Palpation comment tight Rt adductors noted, tender as well.           [place] [facility] Adult PT Treatment/Exercise - 11/10/16 0001      Knee/Hip Exercises: Stretches   Passive Hamstring Stretch Right;2 reps;30 seconds   Passive Hamstring Stretch Limitations (one  rep seated, one rep supine with strap)   ITB Stretch 2 reps;30 seconds  supine with strap   Piriformis Stretch Right - 3 reps;Left;1 rep;30 seconds   Other Knee/Hip Stretches Rt adductor stretch with UE support on counter, 30 sec in Lt lunge.      Knee/Hip Exercises: Standing   Gait Training 160 ft with SPC: step through gait.  VC for proper placement of cane, step length, heel strike.  Improved posture and weight shift with use of SPC, much less lateral lean.      Manual Therapy   Manual Therapy Myofascial release;Soft tissue mobilization;Taping   Manual therapy comments Pt in Lt sidelying    Soft tissue mobilization Edge tool assistance to Rt quad, ITB to decrease fascial restrictions, decrease pain.    Myofascial Release MFR to Rt glute, piriformis, TFL, ITB, lateral quad, prox ant tib.    Kerby;Inhibit Muscle     Kinesiotix   Create Space I strip of Rock tape placed along Rt ITB; perpendicular strips placed prox and distal area where fascial tightness noted to decompress tissue, decrease pain, increase proprioception.  PT Long Term Goals - 11/06/16 1328      PT LONG TERM GOAL #1   Title Decreased myofacial tightness and restrictions with palpation through the Rt hip/thigh/calf 08/20/16   Time 6   Period Weeks   Status New     PT LONG TERM GOAL #2   Title Increase standing and walking time allowing patient to walk for 15-20 min with minimal pain and discomfort 12/18/16   Time 6   Period Weeks   Status New     PT LONG TERM GOAL #3   Title Instruct patient in appropriate body mechanics and positioning to minimize pain and improve comfort through Rt LE 12/18/16   Time 6   Period Weeks   Status New     PT LONG TERM GOAL #4   Title Independent in HEP 12/18/16   Time 6   Period Weeks   Status New               Plan - 11/10/16 1253    Clinical Impression Statement Pt continues to ambulate with antalgic gait; improved  gait pattern with use of cane during session.  Tightness noted in Rt TFL, glute med, ITB.  Pt reported decreased pain by 1 point at end of session.  Pt interested in trying DN in future session.     Rehab Potential Good   PT Frequency 3x / week   PT Duration 6 weeks   PT Treatment/Interventions Patient/family education;Neuromuscular re-education;ADLs/Self Care Home Management;Cryotherapy;Iontophoresis 4mg /ml Dexamethasone;Electrical Stimulation;Moist Heat;Ultrasound;Dry needling;Manual techniques;Therapeutic activities;Therapeutic exercise   PT Next Visit Plan Possible DN; continue manual therapy to Rt hip/LE (including adductors), cont LE stretches.  Pt plans to ask ob/gyn re: ionto to hip/knee and use of biofreeze.     Consulted and Agree with Plan of Care Patient      Patient will benefit from skilled therapeutic intervention in order to improve the following deficits and impairments:  Postural dysfunction, Improper body mechanics, Pain, Increased fascial restricitons, Increased muscle spasms, Decreased activity tolerance, Abnormal gait  Visit Diagnosis: Pain in right hip  Other symptoms and signs involving the musculoskeletal system  Other abnormalities of gait and mobility     Problem List Patient Active Problem List   Diagnosis Date Noted  . Hyperlipidemia   . Anxiety   . ADD (attention deficit disorder)   . Anemia    Kerin Perna, PTA 11/10/16 1:00 PM [place], [place], [address] Phone: [phone]   Fax:  [phone]  Name: Tonya [name] MRN: [id number] Date of Birth: 1986-03-17

## 2016-11-13 ENCOUNTER — Ambulatory Visit (INDEPENDENT_AMBULATORY_CARE_PROVIDER_SITE_OTHER): Payer: 59 | Admitting: Rehabilitative and Restorative Service Providers"

## 2016-11-13 DIAGNOSIS — R29898 Other symptoms and signs involving the musculoskeletal system: Secondary | ICD-10-CM | POA: Diagnosis not present

## 2016-11-13 DIAGNOSIS — R2689 Other abnormalities of gait and mobility: Secondary | ICD-10-CM

## 2016-11-13 DIAGNOSIS — M25551 Pain in right hip: Secondary | ICD-10-CM | POA: Diagnosis not present

## 2016-11-13 NOTE — Patient Instructions (Addendum)
Trigger Point Dry Needling  . What is Trigger Point Dry Needling (DN)? o DN is a physical therapy technique used to treat muscle pain and dysfunction. Specifically, DN helps deactivate muscle trigger points (muscle knots).  o A thin filiform needle is used to penetrate the skin and stimulate the underlying trigger point. The goal is for a local twitch response (LTR) to occur and for the trigger point to relax. No medication of any kind is injected during the procedure.   . What Does Trigger Point Dry Needling Feel Like?  o The procedure feels different for each individual patient. Some patients report that they do not actually feel the needle enter the skin and overall the process is not painful. Very mild bleeding may occur. However, many patients feel a deep cramping in the muscle in which the needle was inserted. This is the local twitch response.   . How Will I feel after the treatment? o Soreness is normal, and the onset of soreness may not occur for a few hours. Typically this soreness does not last longer than two days.  o Bruising is uncommon, however; ice can be used to decrease any possible bruising.  o In rare cases feeling tired or nauseous after the treatment is normal. In addition, your symptoms may get worse before they get better, this period will typically not last longer than 24 hours.   . What Can I do After My Treatment? o Increase your hydration by drinking more water for the next 24 hours. o You may place ice or heat on the areas treated that have become sore, however, do not use heat on inflamed or bruised areas. Heat often brings more relief post needling. o You can continue your regular activities, but vigorous activity is not recommended initially after the treatment for 24 hours. o DN is best combined with other physical therapy such as strengthening, stretching, and other therapies.  IONTOPHORESIS PATIENT PRECAUTIONS & CONTRAINDICATIONS:  . Redness under one or both  electrodes can occur.  This characterized by a uniform redness that usually disappears within 12 hours of treatment. . Small pinhead size blisters may result in response to the drug.  Contact your physician if the problem persists more than 24 hours. . On rare occasions, iontophoresis therapy can result in temporary skin reactions such as rash, inflammation, irritation or burns.  The skin reactions may be the result of individual sensitivity to the ionic solution used, the condition of the skin at the start of treatment, reaction to the materials in the electrodes, allergies or sensitivity to dexamethasone, or a poor connection between the patch and your skin.  Discontinue using iontophoresis if you have any of these reactions and report to your therapist. . Remove the Patch or electrodes if you have any undue sensation of pain or burning during the treatment and report discomfort to your therapist. . Tell your Therapist if you have had known adverse reactions to the application of electrical current. . If using the Patch, the LED light will turn off when treatment is complete and the patch can be removed.  Approximate treatment time is 1-3 hours.  Remove the patch when light goes off or after 6 hours. . The Patch can be worn during normal activity, however excessive motion where the electrodes have been placed can cause poor contact between the skin and the electrode or uneven electrical current resulting in greater risk of skin irritation. . Keep out of the reach of children.   . DO   NOT use if you have a cardiac pacemaker or any other electrically sensitive implanted device. . DO NOT use if you have a known sensitivity to dexamethasone. . DO NOT use during Magnetic Resonance Imaging (MRI). . DO NOT use over broken or compromised skin (e.g. sunburn, cuts, or acne) due to the increased risk of skin reaction. . DO NOT SHAVE over the area to be treated:  To establish good contact between the Patch  and the skin, excessive hair may be clipped. . DO NOT place the Patch or electrodes on or over your eyes, directly over your heart, or brain. . DO NOT reuse the Patch or electrodes as this may cause burns to occur.  

## 2016-11-13 NOTE — Therapy (Signed)
[place], [place], [address] Phone: [phone]   Fax:  [phone]  Physical Therapy Treatment  Patient Details  Name: PAITEN BOIES MRN: [id number] Date of Birth: [birth year] Referring Provider: Dr. Marchia Bond  Encounter Date: 11/13/2016      PT End of Session - 11/13/16 0848    Visit Number 3   Number of Visits 18   Date for PT Re-Evaluation 12/18/16   PT Start Time 0848   PT Stop Time 0945   PT Time Calculation (min) 57 min   Activity Tolerance Patient tolerated treatment well      Past Medical History:  Diagnosis Date  . ADD (attention deficit disorder)   . Anemia   . Anxiety   . Hyperlipidemia     No past surgical history on file.  There were no vitals filed for this visit.      Subjective Assessment - 11/13/16 0849    Subjective Patient reports that she has been using the cane to walk which does help some. She continues have pain which has increased in the past few days. The rolling walker does hlep gait not hurt as much - will borrow one from her dad. Discussed possibility of coming out of work to allowrest and decrease pain in hip and LE.   Pain Score 10-Worst pain ever   Pain Location Hip   Pain Orientation Right;Lateral   Pain Descriptors / Indicators Sharp;Radiating   Pain Type Acute pain   Pain Onset More than a month ago   Pain Frequency Intermittent                         [facility] Adult PT Treatment/Exercise - 11/13/16 0001      Ambulation/Gait   Gait Comments instructed in ambulation with rolling walker and practiced gait in clinic 60 ft x 1; 80 ft x 1      Moist Heat Therapy   Number Minutes Moist Heat 15 Minutes   Moist Heat Location Hip  Rt hip/lateral thigh      Iontophoresis   Type of Iontophoresis Dexamethasone   Location Rt lateral hip    Dose 120 mAmp   Time 12 hr      Manual Therapy   Manual Therapy Myofascial  release;Soft tissue mobilization;Taping   Manual therapy comments Pt in Lt sidelying    Myofascial Release MFR to Rt glute, piriformis, TFL, ITB, lateral quad, prox ant tib.           Trigger Point Dry Needling - 11/13/16 8341    Consent Given? Yes   Education Handout Provided Yes   Muscles Treated Lower Body --  Rt - pt Lt sidelying    Gluteus Maximus Response Palpable increased muscle length   Piriformis Response Palpable increased muscle length   Quadriceps Response Palpable increased muscle length   Hamstring Response Palpable increased muscle length              PT Education - 11/13/16 0918    Education provided Yes   Education Details DN; ionto   Person(s) Educated Patient   Methods Explanation   Comprehension Verbalized understanding          PT Short Term Goals - 11/13/16 0927      PT SHORT TERM GOAL #1   Title Educate in use of rolling walker for gait 11/13/16   Time 1   Period Days   Status Achieved  PT Long Term Goals - 11/13/16 0926      PT LONG TERM GOAL #1   Title Decreased myofacial tightness and restrictions with palpation through the Rt hip/thigh/calf 08/20/16   Time 6   Period Weeks   Status On-going     PT LONG TERM GOAL #2   Title Increase standing and walking time allowing patient to walk for 15-20 min with minimal pain and discomfort 12/18/16   Time 6   Period Weeks   Status On-going     PT LONG TERM GOAL #3   Title Instruct patient in appropriate body mechanics and positioning to minimize pain and improve comfort through Rt LE 12/18/16   Time 6   Period Weeks   Status On-going     PT LONG TERM GOAL #4   Title Independent in HEP 12/18/16   Time 6   Period Weeks   Status On-going               Plan - 11/13/16 1062    Clinical Impression Statement Persistent pain with standing and walking which has not responded to stretching; manual work; heat. Patient tolerated DN well today with palpable decrease in  muscular tightness noted lateral thigh area. Instructed in gait with rolling walker with good decrease in pain when ambulating with the walker. She will borrow walker for home use. Discussed coming out of work to evaluate further rest for hip/LE.    Rehab Potential Good   PT Frequency 3x / week   PT Duration 6 weeks   PT Treatment/Interventions Patient/family education;Neuromuscular re-education;ADLs/Self Care Home Management;Cryotherapy;Iontophoresis 4mg /ml Dexamethasone;Electrical Stimulation;Moist Heat;Ultrasound;Dry needling;Manual techniques;Therapeutic activities;Therapeutic exercise   PT Next Visit Plan assess respoonse  DN; continue manual therapy to Rt hip/LE (including adductors), cont LE stretches.  assess response to ionto to hip/knee and biofreeze.     Consulted and Agree with Plan of Care Patient      Patient will benefit from skilled therapeutic intervention in order to improve the following deficits and impairments:  Postural dysfunction, Improper body mechanics, Pain, Increased fascial restricitons, Increased muscle spasms, Decreased activity tolerance, Abnormal gait  Visit Diagnosis: Pain in right hip  Other symptoms and signs involving the musculoskeletal system  Other abnormalities of gait and mobility     Problem List Patient Active Problem List   Diagnosis Date Noted  . Hyperlipidemia   . Anxiety   . ADD (attention deficit disorder)   . Anemia     Lynette Topete Nilda Simmer PT, MPH  11/13/2016, 9:29 AM  [facility] [place], [place], [address] Phone: [phone]   Fax:  [phone]  Name: MAKINLEY MUSCATO MRN: [id number] Date of Birth: 30-May-1986

## 2016-11-16 ENCOUNTER — Ambulatory Visit (INDEPENDENT_AMBULATORY_CARE_PROVIDER_SITE_OTHER): Payer: 59 | Admitting: Physical Therapy

## 2016-11-16 DIAGNOSIS — R29898 Other symptoms and signs involving the musculoskeletal system: Secondary | ICD-10-CM

## 2016-11-16 DIAGNOSIS — M25551 Pain in right hip: Secondary | ICD-10-CM | POA: Diagnosis not present

## 2016-11-16 DIAGNOSIS — R2689 Other abnormalities of gait and mobility: Secondary | ICD-10-CM

## 2016-11-16 NOTE — Therapy (Signed)
[place], [place], [address] Phone: [phone]   Fax:  [phone]  Physical Therapy Treatment  Patient Details  Name: JEANNINE PENNISI MRN: [id number] Date of Birth: 1986-04-26 Referring Provider: Dr. Marchia Bond  Encounter Date: 11/16/2016      PT End of Session - 11/16/16 1619    Visit Number 4   Number of Visits 18   Date for PT Re-Evaluation 12/18/16   PT Start Time 1620   PT Stop Time 1700   PT Time Calculation (min) 40 min   Activity Tolerance Patient limited by pain      Past Medical History:  Diagnosis Date  . ADD (attention deficit disorder)   . Anemia   . Anxiety   . Hyperlipidemia     No past surgical history on file.  There were no vitals filed for this visit.      Subjective Assessment - 11/16/16 1757    Subjective Pt reports she didn't have much relief with ionto patch, nor DN.  "I feel about the same".  She ambulates in her home with RW or SPC, and SPC in community.  She is unable to use assistive device at work.  She is [redacted] wks pregnant and has follow up with OB tomorrow.   She continues to be limited with transitions and getting into/out of car, "I need to use my arm to lift my RLE up".    Patient Stated Goals get rid of pain and be able to walk with less pain    Currently in Pain? Yes   Pain Score 10-Worst pain ever  2/10 at rest, 8/10 with walking, 10/10 during transitions or prolonged standing   Pain Location Hip   Pain Orientation Right;Lateral;Anterior   Pain Descriptors / Indicators Tightness;Sharp;Radiating   Aggravating Factors  getting in and out of car, walking, transitions.    Pain Relieving Factors rest            [facility] PT Assessment - 11/16/16 0001      Assessment   Medical Diagnosis Rt IT band and hip dysfunction   Referring Provider Dr. Marchia Bond   Onset Date/Surgical Date 09/09/16   Hand Dominance Right   Next MD Visit 6/18    Prior Therapy none          [facility] Adult PT Treatment/Exercise - 11/16/16 0001      Self-Care   Self-Care Other Self-Care Comments;Posture   Posture Educated pt on posture and core support to decrease pain (pt has increased pain with post pelvic tilt)   Other Self-Care Comments  Educated pt on engaging core and using UE assist with RLE into/out of car to decrease pain.  Pt verbalized understanding.      Knee/Hip Exercises: Seated   Other Seated Knee/Hip Exercises lumbar stretch (forward roll down of spine, to tolerance to stretch low back.  Pt reported increased RLE (thigh) pain; stopped and returned to upright. pelvic tilts side/side, ant/post x 2-3 reps each.  Pt has pain with weight shift into Rt hip as well as post pelvic tilt.  Pt encouraged to sit in neutral and/or a little bit of ext.        Knee/Hip Exercises: Supine   Bridges Limitations bridge isometric without lifting pelvis (push through feet, engaging glutes, arm press) x 5 reps      Knee/Hip Exercises: Sidelying   Other Sidelying Knee/Hip Exercises Rt sidelying: core engagement, lengthening through spine, moving pelvis to neutral  x 5 sec hold x 10 reps      Modalities   Modalities --  pt declined; will do ice/heat at home.      Manual Therapy   Manual therapy comments Long leg traction to RLE with pt in supported supine, 5 reps.     Soft tissue mobilization Rt hamstring.    Myofascial Release MFR to Rt Lateral thigh, ITB, glutes, adductors, piriformis, QL and mulitifidi.                     PT Short Term Goals - 11/13/16 0927      PT SHORT TERM GOAL #1   Title Educate in use of rolling walker for gait 11/13/16   Time 1   Period Days   Status Achieved           PT Long Term Goals - 11/13/16 0926      PT LONG TERM GOAL #1   Title Decreased myofacial tightness and restrictions with palpation through the Rt hip/thigh/calf 08/20/16   Time 6   Period Weeks   Status On-going     PT LONG TERM GOAL #2    Title Increase standing and walking time allowing patient to walk for 15-20 min with minimal pain and discomfort 12/18/16   Time 6   Period Weeks   Status On-going     PT LONG TERM GOAL #3   Title Instruct patient in appropriate body mechanics and positioning to minimize pain and improve comfort through Rt LE 12/18/16   Time 6   Period Weeks   Status On-going     PT LONG TERM GOAL #4   Title Independent in HEP 12/18/16   Time 6   Period Weeks   Status On-going               Plan - 11/16/16 1808    Clinical Impression Statement Pt continues with persistent pain in Rt hip/thigh with transitions and WB positions.  Pt's pain level goes to 1-2/10 with sidelying positions.  She had reduction of pain by 2 points by end of session.     Rehab Potential Good   PT Frequency 3x / week   PT Duration 6 weeks   PT Treatment/Interventions Patient/family education;Neuromuscular re-education;ADLs/Self Care Home Management;Cryotherapy;Iontophoresis 4mg /ml Dexamethasone;Electrical Stimulation;Moist Heat;Ultrasound;Dry needling;Manual techniques;Therapeutic activities;Therapeutic exercise   PT Next Visit Plan DN to Rt hip/back.  Continue education on core support and good body mechanics/gait.   Consulted and Agree with Plan of Care Patient      Patient will benefit from skilled therapeutic intervention in order to improve the following deficits and impairments:  Postural dysfunction, Improper body mechanics, Pain, Increased fascial restricitons, Increased muscle spasms, Decreased activity tolerance, Abnormal gait  Visit Diagnosis: Pain in right hip  Other symptoms and signs involving the musculoskeletal system  Other abnormalities of gait and mobility     Problem List Patient Active Problem List   Diagnosis Date Noted  . Hyperlipidemia   . Anxiety   . ADD (attention deficit disorder)   . Anemia    Kerin Perna, PTA 11/16/16 6:14 PM  [facility] [place], [place], [address] Phone: [phone]   Fax:  [phone]  Name: KADIENCE MACCHI MRN: [id number] Date of Birth: 1986-01-18

## 2016-11-17 DIAGNOSIS — Z3480 Encounter for supervision of other normal pregnancy, unspecified trimester: Secondary | ICD-10-CM | POA: Diagnosis not present

## 2016-11-23 ENCOUNTER — Ambulatory Visit (INDEPENDENT_AMBULATORY_CARE_PROVIDER_SITE_OTHER): Payer: 59 | Admitting: Rehabilitative and Restorative Service Providers"

## 2016-11-23 ENCOUNTER — Encounter: Payer: Self-pay | Admitting: Rehabilitative and Restorative Service Providers"

## 2016-11-23 DIAGNOSIS — R29898 Other symptoms and signs involving the musculoskeletal system: Secondary | ICD-10-CM

## 2016-11-23 DIAGNOSIS — M25551 Pain in right hip: Secondary | ICD-10-CM

## 2016-11-23 DIAGNOSIS — R2689 Other abnormalities of gait and mobility: Secondary | ICD-10-CM

## 2016-11-23 NOTE — Therapy (Addendum)
[place], [place], [address] Phone: [phone]   Fax:  [phone]  Physical Therapy Treatment  Patient Details  Name: Tonya [name] MRN: [id number] Date of Birth: 03-Aug-1985 Referring Provider: Dr Merrily Pew landau  Encounter Date: 11/23/2016      PT End of Session - 11/23/16 0857    Visit Number 5   Number of Visits 18   Date for PT Re-Evaluation 12/18/16   PT Start Time 0858   PT Stop Time 0940   PT Time Calculation (min) 42 min   Activity Tolerance Patient tolerated treatment well      Past Medical History:  Diagnosis Date  . ADD (attention deficit disorder)   . Anemia   . Anxiety   . Hyperlipidemia     History reviewed. No pertinent surgical history.  There were no vitals filed for this visit.      Subjective Assessment - 11/23/16 0924    Subjective Continued pain - almost fell in the parking lot earlier this week. Having more difficulty with wt bearing Rt LE. Now using the walker al the time. Still struggling with stairs and has a lot of stairs to get in and out of her house (~15). Patient sees OB today and ortho tomorrow and will discuss further testing and possibly inducing labor.    Currently in Pain? Yes   Pain Score 10-Worst pain ever   Pain Location Hip   Pain Orientation Right;Posterior;Lateral   Pain Descriptors / Indicators Tightness;Sharp;Radiating   Pain Type Acute pain   Pain Onset More than a month ago   Pain Frequency Intermittent            [facility] PT Assessment - 11/23/16 0001      Assessment   Medical Diagnosis Rt IT band and hip dysfunction   Referring Provider Dr Merrily Pew landau   Onset Date/Surgical Date 09/09/16   Hand Dominance Right   Next MD Visit 6/18   Prior Therapy none      Palpation   Palpation comment tenderness/tightness Rt lumbar spine to Rt hip abductors/extensors; hamstrings; ITband; quads                      [facility]  Adult PT Treatment/Exercise - 11/23/16 0001      Moist Heat Therapy   Number Minutes Moist Heat 15 Minutes   Moist Heat Location --  Rt lumbar spine/lateral hip/thigh/knee leg      Manual Therapy   Manual therapy comments pt Lt sidelying    Soft tissue mobilization lumbar spine; Rt hip/abductors/piriformis/ITband/hamstrings/quads with trigger point release work through the posterior hip    Myofascial Release Rt lumbar and hip area                 PT Education - 11/23/16 0929    Education provided Yes   Education Details discussion of possible pathology; testing; treatment    Person(s) Educated Patient   Methods Explanation   Comprehension Verbalized understanding          PT Short Term Goals - 11/13/16 0927      PT SHORT TERM GOAL #1   Title Educate in use of rolling walker for gait 11/13/16   Time 1   Period Days   Status Achieved           PT Long Term Goals - 11/23/16 0932      PT LONG TERM GOAL #1   Title Decreased myofacial  tightness and restrictions with palpation through the Rt hip/thigh/calf 08/20/16   Time 6   Period Weeks   Status On-going     PT LONG TERM GOAL #2   Title Increase standing and walking time allowing patient to walk for 15-20 min with minimal pain and discomfort 12/18/16   Time 6   Period Weeks   Status On-going     PT LONG TERM GOAL #3   Title Instruct patient in appropriate body mechanics and positioning to minimize pain and improve comfort through Rt LE 12/18/16   Time 6   Period Weeks   Status Partially Met     PT LONG TERM GOAL #4   Title Independent in HEP 12/18/16   Time 6   Period Weeks   Status On-going               Plan - 11/23/16 0930    Clinical Impression Statement Continued and increased pain - possibly discogenic in nature. Pregnancy complicates evaluation and treatment. Therapy provides only temporary, partial improvement in pain symtpoms. Tonya [name] will discuss options with OB and orthopedist today  and tomorrow at MD appointments.    Rehab Potential Good   PT Frequency 3x / week   PT Duration 6 weeks   PT Treatment/Interventions Patient/family education;Neuromuscular re-education;ADLs/Self Care Home Management;Cryotherapy;Iontophoresis 46m/ml Dexamethasone;Electrical Stimulation;Moist Heat;Ultrasound;Dry needling;Manual techniques;Therapeutic activities;Therapeutic exercise   PT Next Visit Plan Continue education on core support and good body mechanics/gait. Treatment as indicated.    Consulted and Agree with Plan of Care Patient      Patient will benefit from skilled therapeutic intervention in order to improve the following deficits and impairments:  Postural dysfunction, Improper body mechanics, Pain, Increased fascial restricitons, Increased muscle spasms, Decreased activity tolerance, Abnormal gait  Visit Diagnosis: Pain in right hip  Other symptoms and signs involving the musculoskeletal system  Other abnormalities of gait and mobility     Problem List Patient Active Problem List   Diagnosis Date Noted  . Hyperlipidemia   . Anxiety   . ADD (attention deficit disorder)   . Anemia     Celyn PNilda SimmerPT, MPH  11/23/2016, 9:35 AM  [facility] [address] [address]Phone: [phone]  Fax:  [phone] Name: Tonya TEXEIRAMRN: [id number] of Birth: 107/18/1987 PHYSICAL THERAPY DISCHARGE SUMMARY  Visits from Start of Care: 5  Current functional level related to goals / functional outcomes: See progress note for discharge status   Remaining deficits: Continued radicular pain    Education / Equipment: HEP; education re posture and positioning  Plan: Patient agrees to discharge.  Patient goals were not met. Patient is being discharged due to not returning since the last visit.  ?????     Celyn P. HHelene KelpPT, MPH 01/09/17 10:26 AM

## 2016-11-24 DIAGNOSIS — M79651 Pain in right thigh: Secondary | ICD-10-CM | POA: Diagnosis not present

## 2016-12-03 ENCOUNTER — Encounter (HOSPITAL_COMMUNITY): Payer: Self-pay | Admitting: *Deleted

## 2016-12-03 ENCOUNTER — Inpatient Hospital Stay (HOSPITAL_COMMUNITY): Payer: 59 | Admitting: Anesthesiology

## 2016-12-03 ENCOUNTER — Inpatient Hospital Stay (HOSPITAL_COMMUNITY)
Admission: AD | Admit: 2016-12-03 | Discharge: 2016-12-06 | DRG: 766 | Disposition: A | Discharge status: Home / Self Care | Payer: 59 | Source: Ambulatory Visit | Attending: Obstetrics and Gynecology | Admitting: Obstetrics and Gynecology

## 2016-12-03 ENCOUNTER — Encounter (HOSPITAL_COMMUNITY)
Admission: AD | Disposition: A | Discharge status: Home / Self Care | Payer: Self-pay | Source: Ambulatory Visit | Attending: Obstetrics and Gynecology

## 2016-12-03 DIAGNOSIS — M25551 Pain in right hip: Secondary | ICD-10-CM

## 2016-12-03 DIAGNOSIS — M7631 Iliotibial band syndrome, right leg: Secondary | ICD-10-CM | POA: Diagnosis present

## 2016-12-03 DIAGNOSIS — O9989 Other specified diseases and conditions complicating pregnancy, childbirth and the puerperium: Principal | ICD-10-CM | POA: Diagnosis present

## 2016-12-03 DIAGNOSIS — M25561 Pain in right knee: Secondary | ICD-10-CM

## 2016-12-03 DIAGNOSIS — Z3482 Encounter for supervision of other normal pregnancy, second trimester: Secondary | ICD-10-CM | POA: Diagnosis not present

## 2016-12-03 DIAGNOSIS — Z3A38 38 weeks gestation of pregnancy: Secondary | ICD-10-CM | POA: Diagnosis not present

## 2016-12-03 DIAGNOSIS — Z3483 Encounter for supervision of other normal pregnancy, third trimester: Secondary | ICD-10-CM | POA: Diagnosis not present

## 2016-12-03 DIAGNOSIS — Z3493 Encounter for supervision of normal pregnancy, unspecified, third trimester: Secondary | ICD-10-CM | POA: Diagnosis present

## 2016-12-03 DIAGNOSIS — Z98891 History of uterine scar from previous surgery: Secondary | ICD-10-CM

## 2016-12-03 DIAGNOSIS — O99824 Streptococcus B carrier state complicating childbirth: Secondary | ICD-10-CM | POA: Diagnosis present

## 2016-12-03 DIAGNOSIS — M79651 Pain in right thigh: Secondary | ICD-10-CM | POA: Diagnosis not present

## 2016-12-03 HISTORY — DX: Gestational diabetes mellitus in pregnancy, unspecified control: O24.419

## 2016-12-03 HISTORY — DX: History of uterine scar from previous surgery: Z98.891

## 2016-12-03 LAB — CBC
HEMATOCRIT: 39.7 % (ref 36.0–46.0)
HEMOGLOBIN: 13.5 g/dL (ref 12.0–15.0)
MCH: 32.1 pg (ref 26.0–34.0)
MCHC: 34 g/dL (ref 30.0–36.0)
MCV: 94.5 fL (ref 78.0–100.0)
Platelets: 138 10*3/uL — ABNORMAL LOW (ref 150–400)
RBC: 4.2 MIL/uL (ref 3.87–5.11)
RDW: 14 % (ref 11.5–15.5)
WBC: 6.6 10*3/uL (ref 4.0–10.5)

## 2016-12-03 LAB — GLUCOSE, CAPILLARY: GLUCOSE-CAPILLARY: 92 mg/dL (ref 65–99)

## 2016-12-03 LAB — RPR: RPR Ser Ql: NONREACTIVE

## 2016-12-03 LAB — POCT FERN TEST: POCT Fern Test: POSITIVE

## 2016-12-03 SURGERY — Surgical Case
Anesthesia: Epidural

## 2016-12-03 MED ORDER — SOD CITRATE-CITRIC ACID 500-334 MG/5ML PO SOLN: 30.0000 mL | ORAL | Status: DC | PRN

## 2016-12-03 MED ORDER — NALBUPHINE HCL 10 MG/ML IJ SOLN: 5.0000 mg | Freq: Once | INTRAMUSCULAR | Status: DC | PRN

## 2016-12-03 MED ORDER — EPHEDRINE 5 MG/ML INJ
10.0000 mg | INTRAVENOUS | Status: DC | PRN
Start: 2016-12-03 — End: 2016-12-03

## 2016-12-03 MED ORDER — EPHEDRINE 5 MG/ML INJ: 10.0000 mg | INTRAVENOUS | Status: DC | PRN

## 2016-12-03 MED ORDER — LACTATED RINGERS IV SOLN
INTRAVENOUS | Status: DC
  Administered 2016-12-04: 999 mL via INTRAVENOUS

## 2016-12-03 MED ORDER — OXYTOCIN BOLUS FROM INFUSION: 500.0000 mL | Freq: Once | INTRAVENOUS | Status: DC

## 2016-12-03 MED ORDER — SIMETHICONE 80 MG PO CHEW
80.0000 mg | CHEWABLE_TABLET | ORAL | Status: DC
  Administered 2016-12-04 – 2016-12-05 (×3): 80 mg via ORAL
  Filled 2016-12-03 (×3): qty 1

## 2016-12-03 MED ORDER — TETANUS-DIPHTH-ACELL PERTUSSIS 5-2.5-18.5 LF-MCG/0.5 IM SUSP: 0.5000 mL | Freq: Once | INTRAMUSCULAR | Status: DC

## 2016-12-03 MED ORDER — SIMETHICONE 80 MG PO CHEW
80.0000 mg | CHEWABLE_TABLET | Freq: Three times a day (TID) | ORAL | Status: DC
  Administered 2016-12-04 – 2016-12-06 (×5): 80 mg via ORAL
  Filled 2016-12-03 (×6): qty 1

## 2016-12-03 MED ORDER — PHENYLEPHRINE 40 MCG/ML (10ML) SYRINGE FOR IV PUSH (FOR BLOOD PRESSURE SUPPORT)
80.0000 ug | PREFILLED_SYRINGE | INTRAVENOUS | Status: DC | PRN
  Filled 2016-12-03: qty 10

## 2016-12-03 MED ORDER — TERBUTALINE SULFATE 1 MG/ML IJ SOLN: 0.2500 mg | Freq: Once | INTRAMUSCULAR | Status: DC | PRN

## 2016-12-03 MED ORDER — CEFAZOLIN SODIUM-DEXTROSE 2-3 GM-% IV SOLR
INTRAVENOUS | Status: DC | PRN
  Administered 2016-12-03: 2 g via INTRAVENOUS

## 2016-12-03 MED ORDER — SENNOSIDES-DOCUSATE SODIUM 8.6-50 MG PO TABS
2.0000 | ORAL_TABLET | ORAL | Status: DC
  Administered 2016-12-04 – 2016-12-05 (×3): 2 via ORAL
  Filled 2016-12-03 (×3): qty 2

## 2016-12-03 MED ORDER — SIMETHICONE 80 MG PO CHEW: 80.0000 mg | CHEWABLE_TABLET | ORAL | Status: DC | PRN

## 2016-12-03 MED ORDER — DIPHENHYDRAMINE HCL 25 MG PO CAPS: 25.0000 mg | ORAL_CAPSULE | Freq: Four times a day (QID) | ORAL | Status: DC | PRN

## 2016-12-03 MED ORDER — ZOLPIDEM TARTRATE 5 MG PO TABS: 5.0000 mg | ORAL_TABLET | Freq: Every evening | ORAL | Status: DC | PRN

## 2016-12-03 MED ORDER — LIDOCAINE-EPINEPHRINE (PF) 2 %-1:200000 IJ SOLN
INTRAMUSCULAR | Status: AC
  Filled 2016-12-03: qty 20

## 2016-12-03 MED ORDER — LACTATED RINGERS IV SOLN: 500.0000 mL | Freq: Once | INTRAVENOUS | Status: DC

## 2016-12-03 MED ORDER — MORPHINE SULFATE (PF) 0.5 MG/ML IJ SOLN
INTRAMUSCULAR | Status: DC | PRN
  Administered 2016-12-03: 4 mg via EPIDURAL

## 2016-12-03 MED ORDER — NALOXONE HCL 0.4 MG/ML IJ SOLN: 0.4000 mg | INTRAMUSCULAR | Status: DC | PRN

## 2016-12-03 MED ORDER — SCOPOLAMINE 1 MG/3DAYS TD PT72
1.0000 | MEDICATED_PATCH | Freq: Once | TRANSDERMAL | Status: DC
  Filled 2016-12-03: qty 1

## 2016-12-03 MED ORDER — SCOPOLAMINE 1 MG/3DAYS TD PT72
MEDICATED_PATCH | TRANSDERMAL | Status: AC
Start: 2016-12-03 — End: 2016-12-03
  Filled 2016-12-03: qty 1

## 2016-12-03 MED ORDER — ACETAMINOPHEN 325 MG PO TABS: 650.0000 mg | ORAL_TABLET | ORAL | Status: DC | PRN

## 2016-12-03 MED ORDER — DIPHENHYDRAMINE HCL 50 MG/ML IJ SOLN: 12.5000 mg | INTRAMUSCULAR | Status: DC | PRN

## 2016-12-03 MED ORDER — NALOXONE HCL 2 MG/2ML IJ SOSY
1.0000 ug/kg/h | PREFILLED_SYRINGE | INTRAVENOUS | Status: DC | PRN
  Filled 2016-12-03: qty 2

## 2016-12-03 MED ORDER — DIBUCAINE 1 % RE OINT: 1.0000 "application " | TOPICAL_OINTMENT | RECTAL | Status: DC | PRN

## 2016-12-03 MED ORDER — ACETAMINOPHEN 10 MG/ML IV SOLN
1000.0000 mg | Freq: Once | INTRAVENOUS | Status: AC | PRN
  Filled 2016-12-03: qty 100

## 2016-12-03 MED ORDER — HYDROMORPHONE HCL 1 MG/ML IJ SOLN: 0.2500 mg | INTRAMUSCULAR | Status: DC | PRN

## 2016-12-03 MED ORDER — OXYCODONE-ACETAMINOPHEN 5-325 MG PO TABS
1.0000 | ORAL_TABLET | ORAL | Status: DC | PRN
  Administered 2016-12-04 – 2016-12-05 (×2): 1 via ORAL
  Filled 2016-12-03 (×2): qty 1

## 2016-12-03 MED ORDER — ONDANSETRON HCL 4 MG/2ML IJ SOLN
INTRAMUSCULAR | Status: AC
  Filled 2016-12-03: qty 2

## 2016-12-03 MED ORDER — NALBUPHINE HCL 10 MG/ML IJ SOLN: 5.0000 mg | INTRAMUSCULAR | Status: DC | PRN

## 2016-12-03 MED ORDER — COCONUT OIL OIL
1.0000 | TOPICAL_OIL | Status: DC | PRN
Start: 2016-12-03 — End: 2016-12-06

## 2016-12-03 MED ORDER — IBUPROFEN 600 MG PO TABS
600.0000 mg | ORAL_TABLET | Freq: Four times a day (QID) | ORAL | Status: DC
  Administered 2016-12-04 – 2016-12-06 (×10): 600 mg via ORAL
  Filled 2016-12-03 (×12): qty 1

## 2016-12-03 MED ORDER — KETOROLAC TROMETHAMINE 30 MG/ML IJ SOLN
INTRAMUSCULAR | Status: AC
  Administered 2016-12-03: 30 mg
  Filled 2016-12-03: qty 1

## 2016-12-03 MED ORDER — LACTATED RINGERS IV SOLN
INTRAVENOUS | Status: DC | PRN
  Administered 2016-12-03: 17:00:00 via INTRAVENOUS

## 2016-12-03 MED ORDER — FENTANYL 2.5 MCG/ML BUPIVACAINE 1/10 % EPIDURAL INFUSION (WH - ANES)
14.0000 mL/h | INTRAMUSCULAR | Status: DC | PRN
  Administered 2016-12-03: 14 mL/h via EPIDURAL
  Filled 2016-12-03: qty 100

## 2016-12-03 MED ORDER — MEPERIDINE HCL 25 MG/ML IJ SOLN: 6.2500 mg | INTRAMUSCULAR | Status: DC | PRN

## 2016-12-03 MED ORDER — LIDOCAINE HCL (PF) 1 % IJ SOLN
INTRAMUSCULAR | Status: DC | PRN
  Administered 2016-12-03 (×2): 4 mL via EPIDURAL

## 2016-12-03 MED ORDER — ONDANSETRON HCL 4 MG/2ML IJ SOLN
INTRAMUSCULAR | Status: DC | PRN
  Administered 2016-12-03: 4 mg via INTRAVENOUS

## 2016-12-03 MED ORDER — SODIUM CHLORIDE 0.9% FLUSH
3.0000 mL | INTRAVENOUS | Status: DC | PRN
  Administered 2016-12-04: 3 mL via INTRAVENOUS
  Filled 2016-12-03: qty 3

## 2016-12-03 MED ORDER — OXYTOCIN 40 UNITS IN LACTATED RINGERS INFUSION - SIMPLE MED: 2.5000 [IU]/h | INTRAVENOUS | Status: AC

## 2016-12-03 MED ORDER — LACTATED RINGERS IV SOLN
500.0000 mL | INTRAVENOUS | Status: DC | PRN
  Administered 2016-12-03: 500 mL via INTRAVENOUS
  Administered 2016-12-03: 1000 mL via INTRAVENOUS
  Administered 2016-12-03: 500 mL via INTRAVENOUS

## 2016-12-03 MED ORDER — PHENYLEPHRINE 40 MCG/ML (10ML) SYRINGE FOR IV PUSH (FOR BLOOD PRESSURE SUPPORT): 80.0000 ug | PREFILLED_SYRINGE | INTRAVENOUS | Status: DC | PRN

## 2016-12-03 MED ORDER — PRENATAL MULTIVITAMIN CH
1.0000 | ORAL_TABLET | Freq: Every day | ORAL | Status: DC
  Filled 2016-12-03 (×3): qty 1

## 2016-12-03 MED ORDER — LACTATED RINGERS IV SOLN
INTRAVENOUS | Status: DC
  Administered 2016-12-03 (×2): via INTRAVENOUS

## 2016-12-03 MED ORDER — KETOROLAC TROMETHAMINE 30 MG/ML IJ SOLN: 30.0000 mg | Freq: Four times a day (QID) | INTRAMUSCULAR | Status: AC | PRN

## 2016-12-03 MED ORDER — LACTATED RINGERS IV SOLN
INTRAVENOUS | Status: DC | PRN
  Administered 2016-12-03 (×2): via INTRAVENOUS

## 2016-12-03 MED ORDER — LACTATED RINGERS IV SOLN
INTRAVENOUS | Status: DC | PRN
  Administered 2016-12-03: 40 [IU] via INTRAVENOUS

## 2016-12-03 MED ORDER — SCOPOLAMINE 1 MG/3DAYS TD PT72
MEDICATED_PATCH | TRANSDERMAL | Status: DC | PRN
  Administered 2016-12-03: 1 via TRANSDERMAL

## 2016-12-03 MED ORDER — FLEET ENEMA 7-19 GM/118ML RE ENEM: 1.0000 | ENEMA | RECTAL | Status: DC | PRN

## 2016-12-03 MED ORDER — OXYCODONE-ACETAMINOPHEN 5-325 MG PO TABS: 2.0000 | ORAL_TABLET | ORAL | Status: DC | PRN

## 2016-12-03 MED ORDER — OXYTOCIN 10 UNIT/ML IJ SOLN
INTRAMUSCULAR | Status: AC
  Filled 2016-12-03: qty 4

## 2016-12-03 MED ORDER — OXYCODONE-ACETAMINOPHEN 5-325 MG PO TABS: 1.0000 | ORAL_TABLET | ORAL | Status: DC | PRN

## 2016-12-03 MED ORDER — DEXTROSE 5 % IV SOLN
5.0000 10*6.[IU] | Freq: Once | INTRAVENOUS | Status: AC
  Administered 2016-12-03: 5 10*6.[IU] via INTRAVENOUS
  Filled 2016-12-03: qty 5

## 2016-12-03 MED ORDER — PROMETHAZINE HCL 25 MG/ML IJ SOLN: 6.2500 mg | INTRAMUSCULAR | Status: DC | PRN

## 2016-12-03 MED ORDER — OXYTOCIN 40 UNITS IN LACTATED RINGERS INFUSION - SIMPLE MED
1.0000 m[IU]/min | INTRAVENOUS | Status: DC
  Administered 2016-12-03: 2 m[IU]/min via INTRAVENOUS
  Filled 2016-12-03: qty 1000

## 2016-12-03 MED ORDER — SODIUM BICARBONATE 8.4 % IV SOLN
INTRAVENOUS | Status: DC | PRN
  Administered 2016-12-03 (×2): 5 mL via EPIDURAL

## 2016-12-03 MED ORDER — ONDANSETRON HCL 4 MG/2ML IJ SOLN: 4.0000 mg | Freq: Four times a day (QID) | INTRAMUSCULAR | Status: DC | PRN

## 2016-12-03 MED ORDER — CEFAZOLIN SODIUM-DEXTROSE 2-4 GM/100ML-% IV SOLN
INTRAVENOUS | Status: AC
  Filled 2016-12-03: qty 100

## 2016-12-03 MED ORDER — OXYCODONE-ACETAMINOPHEN 5-325 MG PO TABS
2.0000 | ORAL_TABLET | ORAL | Status: DC | PRN
  Administered 2016-12-04: 2 via ORAL
  Filled 2016-12-03: qty 2

## 2016-12-03 MED ORDER — SODIUM BICARBONATE 8.4 % IV SOLN
INTRAVENOUS | Status: AC
  Filled 2016-12-03: qty 50

## 2016-12-03 MED ORDER — DIPHENHYDRAMINE HCL 25 MG PO CAPS: 25.0000 mg | ORAL_CAPSULE | ORAL | Status: DC | PRN

## 2016-12-03 MED ORDER — LIDOCAINE HCL (PF) 1 % IJ SOLN: 30.0000 mL | INTRAMUSCULAR | Status: DC | PRN

## 2016-12-03 MED ORDER — ONDANSETRON HCL 4 MG/2ML IJ SOLN: 4.0000 mg | Freq: Three times a day (TID) | INTRAMUSCULAR | Status: DC | PRN

## 2016-12-03 MED ORDER — MORPHINE SULFATE (PF) 0.5 MG/ML IJ SOLN
INTRAMUSCULAR | Status: AC
  Filled 2016-12-03: qty 10

## 2016-12-03 MED ORDER — MENTHOL 3 MG MT LOZG: 1.0000 | LOZENGE | OROMUCOSAL | Status: DC | PRN

## 2016-12-03 MED ORDER — PENICILLIN G POT IN DEXTROSE 60000 UNIT/ML IV SOLN
3.0000 10*6.[IU] | INTRAVENOUS | Status: DC
  Administered 2016-12-03: 3 10*6.[IU] via INTRAVENOUS
  Filled 2016-12-03 (×5): qty 50

## 2016-12-03 MED ORDER — OXYTOCIN 40 UNITS IN LACTATED RINGERS INFUSION - SIMPLE MED: 2.5000 [IU]/h | INTRAVENOUS | Status: DC

## 2016-12-03 MED ORDER — WITCH HAZEL-GLYCERIN EX PADS: 1.0000 "application " | MEDICATED_PAD | CUTANEOUS | Status: DC | PRN

## 2016-12-03 SURGICAL SUPPLY — 37 items
APL SKNCLS STERI-STRIP NONHPOA (GAUZE/BANDAGES/DRESSINGS) ×1
BARRIER ADHS 3X4 INTERCEED (GAUZE/BANDAGES/DRESSINGS) IMPLANT
BENZOIN TINCTURE PRP APPL 2/3 (GAUZE/BANDAGES/DRESSINGS) ×2 IMPLANT
BRR ADH 4X3 ABS CNTRL BYND (GAUZE/BANDAGES/DRESSINGS)
CHLORAPREP W/TINT 26ML (MISCELLANEOUS) ×3 IMPLANT
CLAMP CORD UMBIL (MISCELLANEOUS) IMPLANT
CLOSURE WOUND 1/2 X4 (GAUZE/BANDAGES/DRESSINGS) ×1
CLOTH BEACON ORANGE TIMEOUT ST (SAFETY) ×3 IMPLANT
CONTAINER PREFILL 10% NBF 15ML (MISCELLANEOUS) IMPLANT
DRSG OPSITE POSTOP 4X10 (GAUZE/BANDAGES/DRESSINGS) ×3 IMPLANT
ELECT REM PT RETURN 9FT ADLT (ELECTROSURGICAL) ×3
ELECTRODE REM PT RTRN 9FT ADLT (ELECTROSURGICAL) ×1 IMPLANT
EXTRACTOR VACUUM M CUP 4 TUBE (SUCTIONS) IMPLANT
EXTRACTOR VACUUM M CUP 4' TUBE (SUCTIONS)
GLOVE BIO SURGEON STRL SZ 6.5 (GLOVE) ×2 IMPLANT
GLOVE BIO SURGEONS STRL SZ 6.5 (GLOVE) ×1
GLOVE BIOGEL PI IND STRL 7.0 (GLOVE) ×1 IMPLANT
GLOVE BIOGEL PI INDICATOR 7.0 (GLOVE) ×2
GOWN STRL REUS W/TWL LRG LVL3 (GOWN DISPOSABLE) ×6 IMPLANT
KIT ABG SYR 3ML LUER SLIP (SYRINGE) IMPLANT
NDL HYPO 25X5/8 SAFETYGLIDE (NEEDLE) ×1 IMPLANT
NEEDLE HYPO 22GX1.5 SAFETY (NEEDLE) IMPLANT
NEEDLE HYPO 25X5/8 SAFETYGLIDE (NEEDLE) ×3 IMPLANT
NS IRRIG 1000ML POUR BTL (IV SOLUTION) ×3 IMPLANT
PACK C SECTION WH (CUSTOM PROCEDURE TRAY) ×3 IMPLANT
PAD OB MATERNITY 4.3X12.25 (PERSONAL CARE ITEMS) ×3 IMPLANT
PENCIL SMOKE EVAC W/HOLSTER (ELECTROSURGICAL) ×3 IMPLANT
STRIP CLOSURE SKIN 1/2X4 (GAUZE/BANDAGES/DRESSINGS) ×1 IMPLANT
SUT CHROMIC 0 CTX 36 (SUTURE) ×6 IMPLANT
SUT PLAIN 0 NONE (SUTURE) IMPLANT
SUT PLAIN 2 0 XLH (SUTURE) IMPLANT
SUT VIC AB 0 CT1 27 (SUTURE) ×9
SUT VIC AB 0 CT1 27XBRD ANBCTR (SUTURE) ×3 IMPLANT
SUT VIC AB 4-0 KS 27 (SUTURE) IMPLANT
SYR CONTROL 10ML LL (SYRINGE) IMPLANT
TOWEL OR 17X24 6PK STRL BLUE (TOWEL DISPOSABLE) ×3 IMPLANT
TRAY FOLEY BAG SILVER LF 14FR (SET/KITS/TRAYS/PACK) ×3 IMPLANT

## 2016-12-03 NOTE — Anesthesia Postprocedure Evaluation (Signed)
Anesthesia Post Note  Patient: Tonya [name]  Procedure(s) Performed: Procedure(s) (LRB): CESAREAN SECTION (N/A)     Patient location during evaluation: Mother Baby Anesthesia Type: Epidural Level of consciousness: awake and alert Pain management: pain level controlled Vital Signs Assessment: post-procedure vital signs reviewed and stable Respiratory status: spontaneous breathing, nonlabored ventilation and respiratory function stable Cardiovascular status: stable Postop Assessment: no headache, no backache and epidural receding Anesthetic complications: no    Last Vitals:  Vitals:   12/03/16 2015 12/03/16 2100  BP: 117/69 131/72  Pulse: 90 (!) 103  Resp: 18   Temp: 37.1 C 37.3 C    Last Pain:  Vitals:   12/03/16 2130  TempSrc:   PainSc: 5    Pain Goal: Patients Stated Pain Goal: 0 (12/03/16 0542)               Karyl Kinnier Ashlynn Gunnels

## 2016-12-03 NOTE — Anesthesia Procedure Notes (Signed)
Epidural Patient location during procedure: OB Start time: 12/03/2016 9:01 AM  Staffing Anesthesiologist: Adele Barthel P Performed: anesthesiologist   Preanesthetic Checklist Completed: patient identified, site marked, pre-op evaluation, timeout performed, IV checked, risks and benefits discussed and monitors and equipment checked  Epidural Patient position: sitting Prep: DuraPrep Patient monitoring: heart rate, continuous pulse ox and blood pressure Approach: midline Location: L3-L4 Injection technique: LOR air  Needle:  Needle type: Tuohy  Needle gauge: 17 G Needle length: 9 cm Needle insertion depth: 5 cm Catheter type: closed end flexible Catheter size: 19 Gauge Catheter at skin depth: 10 cm Test dose: negative and Other  Assessment Events: blood not aspirated, injection not painful, no injection resistance and negative IV test  Additional Notes Informed consent obtained prior to proceeding including risk of failure, 1% risk of PDPH, risk of minor discomfort and bruising.  Discussed rare but serious complications.  Discussed alternatives to epidural analgesia and patient desires to proceed.  Timeout performed pre-procedure verifying patient name, procedure, and platelet count.  Patient tolerated procedure well. Reason for block:procedure for pain

## 2016-12-03 NOTE — H&P (Signed)
Tonya [name] is a 31 y.o. G 1 P 0 at 28 w 4 days presents with SROM. 2 cm dilated Patient has right iliotibial band syndrome and has been using a walker at home. OB History    Gravida Para Term Preterm AB Living   1             SAB TAB Ectopic Multiple Live Births                 Past Medical History:  Diagnosis Date  . ADD (attention deficit disorder)   . Anxiety   . Gestational diabetes   . Hyperlipidemia    Past Surgical History:  Procedure Laterality Date  . WISDOM TOOTH EXTRACTION     Family History: family history includes Cancer in her father; Depression in her mother; Hypertension in her father and mother. Social History:  reports that she has never smoked. She has never used smokeless tobacco. She reports that she does not drink alcohol or use drugs.     Maternal Diabetes: No Genetic Screening: Normal Maternal Ultrasounds/Referrals: Normal Fetal Ultrasounds or other Referrals:  None Maternal Substance Abuse:  No Significant Maternal Medications:  None Significant Maternal Lab Results:  None Other Comments:  None  Review of Systems  All other systems reviewed and are negative.  Maternal Medical History:  Reason for admission: Rupture of membranes.   Prenatal complications: no prenatal complications   Dilation: 2 Effacement (%): 80 Station: -2 Exam by:: benji Production designer, theatre/television/film Blood pressure 135/89, pulse 84, temperature 98.1 F (36.7 C), temperature source Oral, resp. rate 18, height 5' 5.5" (1.664 m), weight 95.3 kg (210 lb), last menstrual period 02/25/2016. Maternal Exam:  Abdomen: Fetal presentation: vertex     Fetal Exam Fetal State Assessment: Category I - tracings are normal.     Physical Exam  Nursing note and vitals reviewed. Constitutional: She appears well-developed and well-nourished.  HENT:  Head: Normocephalic.  Eyes: Pupils are equal, round, and reactive to light.  Neck: Normal range of motion.  Cardiovascular: Normal rate and  regular rhythm.   Respiratory: Effort normal.  GI: Soft.    Prenatal labs: ABO, Rh:   Antibody:   Rubella:   RPR:    HBsAg: NEGATIVE (09/18 1615)  HIV:    GBS:     Assessment/Plan: IUP at 38 w 4 days SROM Iliotibial band syndrome Admit Pitocin GBBS + penicillin Epidural now evaluate iliotibial band syndrome post delivery - most likely will need physical therapy and muscle relaxants post partum    Blayn Whetsell L 12/03/2016, 7:10 AM

## 2016-12-03 NOTE — MAU Note (Signed)
Leaking clear fld since 0215. Some bloody mucous as well. Mild cramping. Has IT band syndrome. Using walker to walk as cannot put weight on R foot. Causes pain in R shin if does put wt on it. Has to pick up leg with hands

## 2016-12-03 NOTE — Transfer of Care (Signed)
Immediate Anesthesia Transfer of Care Note  Patient: Tonya [name]  Procedure(s) Performed: Procedure(s): CESAREAN SECTION (N/A)  Patient Location: PACU  Anesthesia Type:Epidural  Level of Consciousness: awake  Airway & Oxygen Therapy: Patient Spontanous Breathing  Post-op Assessment: Report given to RN and Post -op Vital signs reviewed and stable  Post vital signs: stable  Last Vitals:  Vitals:   12/03/16 1513 12/03/16 1601  BP:    Pulse:    Resp: 16 20  Temp:  36.8 C    Last Pain:  Vitals:   12/03/16 1601  TempSrc: Oral  PainSc:       Patients Stated Pain Goal: 0 (44/81/85 6314)  Complications: No apparent anesthesia complications

## 2016-12-03 NOTE — Lactation Note (Signed)
This note was copied from a baby's chart. Lactation Consultation Note  Patient Name: Tonya [name] Tonya [name] Date: 12/03/2016 Reason for consult: Initial assessment  Initial visit at 1 hours of age, LC called to PACU for latch assist.  Baby is [redacted]w[redacted]d at 5#7oz.  Mom GDM diet controlled.  Mom has large full firm breasts, appears to have edema, mom reports normal through pregnancy.  Evert nipples that flatten with compression, right breast more compressible.  Hand pump used to help soften breast, reverse pressure does not work.  Mom to use shells and DEBP when arrives to post partum unit. [place] placed in room 130 for when patient arrives and reported to [facility], Jan so review use with mom.   LC attempted to latch baby to right breast, baby opens mouth, but does not suck.  Baby very fussy with high pitched cry.  LC noted short lingual frenulum when baby cries.  Mom maybe candidate for Nipple shield.   LC used hand pump to collect several drops and applied to baby's mouth. Hand expression alone does not yield any drops at this time.   Discussed feeding plan due to poor feeding and LBW <6#.  Mom agreeable to formula supplementation and prefers baby to use bottle nipple at this time.  LC advised nursery Rn to use neosure formula due to limited expressed breast milk at this time.   [facility] LC resources given and discussed.  Encouraged to feed with early cues on demand. Mom to wake baby every 2 1/2-3 hours for feedings as needed.   Early newborn behavior discussed.  Mom to call for assist as needed.    Maternal Data Has patient been taught Hand Expression?: Yes Does the patient have breastfeeding experience prior to this delivery?: No  Feeding Feeding Type: Formula Nipple Type: Slow - flow Length of feed: 1 min  LATCH Score/Interventions Latch: Too sleepy or reluctant, no latch achieved, no sucking elicited. Intervention(s): Skin to skin;Teach feeding cues;Waking techniques Intervention(s): Breast  massage;Breast compression  Audible Swallowing: None Intervention(s): Skin to skin;Hand expression  Type of Nipple: Everted at rest and after stimulation (short evert flatten with compression)  Comfort (Breast/Nipple): Filling, red/small blisters or bruises, mild/mod discomfort (breast not tender, but non compressible on left)     Hold (Positioning): Assistance needed to correctly position infant at breast and maintain latch. Intervention(s): Breastfeeding basics reviewed;Support Pillows;Position options;Skin to skin  LATCH Score: 4  Lactation Tools Discussed/Used Initiated by:: [place] Date initiated:: 12/03/16   Consult Status Consult Status: Follow-up Date: 12/04/16 Follow-up type: In-patient    Shoptaw, Tonya [name] 12/03/2016, 8:00 PM

## 2016-12-03 NOTE — Op Note (Signed)
[name], ALL           ACCOUNT NO.:  [id number]  MEDICAL RECORD NO.:  [id number]  LOCATION:  9167                          FACILITY:  [facility]  PHYSICIAN:  Kyia Rhude L. Rayli Wiederhold, M.D.DATE OF BIRTH:  1985-10-15  DATE OF PROCEDURE:  12/03/2016 DATE OF DISCHARGE:                              OPERATIVE REPORT   PREOPERATIVE DIAGNOSES:  Intrauterine pregnancy at 38 weeks and 4 days and fetal intolerance to labor.  POSTOPERATIVE DIAGNOSIS:  Intrauterine pregnancy at 38 weeks and 4 days and fetal intolerance to labor.  PROCEDURE:  Primary low transverse cesarean section.  SURGEON:  Belen Zwahlen L. Helane Rima, M.D.  ANESTHESIA:  Epidural.  ESTIMATED BLOOD LOSS:  700 mL.  URINE OUTPUT:  300 mL.  DESCRIPTION OF PROCEDURE:  The patient is a 30 year old, gravida 1, para 0, at 38 weeks and 4 days.  She presented this morning with spontaneous rupture of membranes.  Subsequently, given her epidural and placed on Pitocin.  The fetal heart rate was __________.  The variable decelerations also reassuring, but suddenly developed deep variables down to the low 60s.  The patient was only about 6 cm dilated.  Given she was remote from vaginal delivery, it was proceeded with a stat C- section.  The patient was taken to the operating room.  She was prepped and draped.  A time-out was performed.  Allis test was performed.  A low transverse incision was made, carried down to the fascia.  Fascia was scored in the midline and extended laterally.  Rectus muscles were separated in the midline.  The peritoneum was entered bluntly.  The peritoneal incision was then stretched.  The bladder blade was inserted. The lower uterine segment was then identified and the bladder flap was created sharply and then digitally.  The bladder blade was then readjusted.  A low-transverse incision was made.  Amniotic fluid was clear.  The baby was in cephalic presentation with a large nuchal cord. At the incision, there was a  tight nuchal cord.  There was also a loop of cord wrapped around 1 of the baby's arms and the baby was actually holding that with the cord.  The baby was delivered easily with 1 gentle pull with a vacuum.  It was a female infant and vigorous on the abdomen. Apgars 8 at 1 minute and 9 at 5 minutes.  The cord was clamped and cut. The baby was handed to the Neonatal team and subsequently did skin to skin.  The placenta was manually removed, noted to be normal intact with a 3-vessel cord.  The uterus was exteriorized and cleared of all clots and debris.  The uterine incision was closed in 1 layer using 0 chromic in a running lock stitch.  Irrigation was performed.  The peritoneum was closed using 0 Vicryl.  The fascia was closed using 0 Vicryl, starting each corner, meeting in the midline. After irrigation of subcutaneous layer, the skin was closed with a 4-0 Vicryl on a Keith needle.  All sponge, lap, and instrument counts were correct x2.  The patient went to recovery room in stable condition.     [name] L. Helane Rima, M.D.     Nevin Bloodgood  D:  12/03/2016  T:  12/03/2016  Job:  [id number]

## 2016-12-03 NOTE — Anesthesia Pain Management Evaluation Note (Signed)
  CRNA Pain Management Visit Note  Patient: Tonya [name], 31 y.o., female  "Hello I am a member of the anesthesia team at [facility]. We have an anesthesia team available at all times to provide care throughout the hospital, including epidural management and anesthesia for C-section. I don't know your plan for the delivery whether it a natural birth, water birth, IV sedation, nitrous supplementation, doula or epidural, but we want to meet your pain goals."   1.Was your pain managed to your expectations on prior hospitalizations?   No prior hospitalizations  2.What is your expectation for pain management during this hospitalization?     Epidural  3.How can we help you reach that goal? epidural  Record the patient's initial score and the patient's pain goal.   Pain: 1  Pain Goal: 6 [facility] wants you to be able to say your pain was always managed very well.  [name] 12/03/2016

## 2016-12-03 NOTE — Anesthesia Preprocedure Evaluation (Signed)
Anesthesia Evaluation  Patient identified by MRN, date of birth, ID band Patient awake    Reviewed: Allergy & Precautions, NPO status , Patient's Chart, lab work & pertinent test results  Airway Mallampati: II  TM Distance: >3 FB Neck ROM: Full    Dental no notable dental hx.    Pulmonary neg pulmonary ROS,    Pulmonary exam normal breath sounds clear to auscultation       Cardiovascular negative cardio ROS Normal cardiovascular exam Rhythm:Regular Rate:Normal     Neuro/Psych Anxiety negative neurological ROS     GI/Hepatic negative GI ROS, Neg liver ROS,   Endo/Other  diabetes, Gestational  Renal/GU negative Renal ROS  negative genitourinary   Musculoskeletal negative musculoskeletal ROS (+)   Abdominal (+) + obese,   Peds negative pediatric ROS (+)  Hematology negative hematology ROS (+)   Anesthesia Other Findings Plt: 138 Obese, BMI 34  Reproductive/Obstetrics (+) Pregnancy                             Anesthesia Physical Anesthesia Plan  ASA: II  Anesthesia Plan: Epidural   Post-op Pain Management:    Induction:   PONV Risk Score and Plan:   Airway Management Planned:   Additional Equipment:   Intra-op Plan:   Post-operative Plan:   Informed Consent: I have reviewed the patients History and Physical, chart, labs and discussed the procedure including the risks, benefits and alternatives for the proposed anesthesia with the patient or authorized representative who has indicated his/her understanding and acceptance.     Plan Discussed with:   Anesthesia Plan Comments:         Anesthesia Quick Evaluation

## 2016-12-03 NOTE — Brief Op Note (Signed)
12/03/2016  5:49 PM  PATIENT:  Tonya [name]  31 y.o. female  PRE-OPERATIVE DIAGNOSIS:   IUP at 74 w 4 day Fetal decelerations  POST-OPERATIVE DIAGNOSIS:  Same  PROCEDURE:  Procedure(s): CESAREAN SECTION (N/A)  SURGEON:  Surgeon(s) and Role:    * Dian Queen, MD - Primary  PHYSICIAN ASSISTANT:   ASSISTANTS: none   ANESTHESIA:   epidural  EBL:  Total I/O In: 1000 [I.V.:1000] Out: 1000 [Urine:300; Blood:700]  BLOOD ADMINISTERED:none  DRAINS: Urinary Catheter (Foley)   LOCAL MEDICATIONS USED:  NONE  SPECIMEN:  No Specimen  DISPOSITION OF SPECIMEN:  N/A  COUNTS:  YES  TOURNIQUET:  * No tourniquets in log *  DICTATION: .Other Dictation: Dictation Number [id number]  PLAN OF CARE: Admit to inpatient   PATIENT DISPOSITION:  PACU - hemodynamically stable.   Delay start of Pharmacological VTE agent (>24hrs) due to surgical blood loss or risk of bleeding: not applicable

## 2016-12-04 ENCOUNTER — Inpatient Hospital Stay (HOSPITAL_COMMUNITY): Payer: 59

## 2016-12-04 ENCOUNTER — Encounter (HOSPITAL_COMMUNITY): Payer: Self-pay | Admitting: Obstetrics and Gynecology

## 2016-12-04 LAB — CBC
HEMATOCRIT: 34.8 % — AB (ref 36.0–46.0)
Hemoglobin: 11.9 g/dL — ABNORMAL LOW (ref 12.0–15.0)
MCH: 32.4 pg (ref 26.0–34.0)
MCHC: 34.2 g/dL (ref 30.0–36.0)
MCV: 94.8 fL (ref 78.0–100.0)
Platelets: 124 10*3/uL — ABNORMAL LOW (ref 150–400)
RBC: 3.67 MIL/uL — ABNORMAL LOW (ref 3.87–5.11)
RDW: 14 % (ref 11.5–15.5)
WBC: 7.6 10*3/uL (ref 4.0–10.5)

## 2016-12-04 LAB — GLUCOSE, CAPILLARY: Glucose-Capillary: 98 mg/dL (ref 65–99)

## 2016-12-04 MED ORDER — METHYLPREDNISOLONE 4 MG PO TBPK
4.0000 mg | ORAL_TABLET | ORAL | Status: AC
  Administered 2016-12-04: 4 mg via ORAL

## 2016-12-04 MED ORDER — METHYLPREDNISOLONE 4 MG PO TBPK
4.0000 mg | ORAL_TABLET | Freq: Four times a day (QID) | ORAL | Status: DC
  Administered 2016-12-06: 4 mg via ORAL

## 2016-12-04 MED ORDER — METHYLPREDNISOLONE 4 MG PO TBPK
4.0000 mg | ORAL_TABLET | Freq: Three times a day (TID) | ORAL | Status: AC
  Administered 2016-12-05 (×3): 4 mg via ORAL

## 2016-12-04 MED ORDER — METHYLPREDNISOLONE 4 MG PO TBPK
8.0000 mg | ORAL_TABLET | Freq: Every morning | ORAL | Status: AC
  Administered 2016-12-04: 8 mg via ORAL
  Filled 2016-12-04: qty 21

## 2016-12-04 MED ORDER — METHYLPREDNISOLONE 4 MG PO TBPK
8.0000 mg | ORAL_TABLET | Freq: Every evening | ORAL | Status: AC
  Administered 2016-12-05: 8 mg via ORAL

## 2016-12-04 MED ORDER — METHYLPREDNISOLONE 4 MG PO TBPK
8.0000 mg | ORAL_TABLET | Freq: Every evening | ORAL | Status: AC
  Administered 2016-12-04: 8 mg via ORAL

## 2016-12-04 NOTE — Progress Notes (Signed)
Subjective: Postpartum Day 1: Cesarean Delivery Patient reports tolerating PO.  Concerns voiced regarding her immobility stemming from ? Iliotibial band. Patient has seen Dr. Arlyn Leak at [facility]. Has not had imaging of the extremity. Was given Medrol dose pack ,but patient reported that she has not tried.  Objective: Vital signs in last 24 hours: Temp:  [98 F (36.7 C)-99.1 F (37.3 C)] 98.2 F (36.8 C) (06/11 0530) Pulse Rate:  [72-101] 85 (06/11 0530) Resp:  [0-25] 18 (06/11 0530) BP: (104-137)/(59-88) 104/62 (06/11 0530) SpO2:  [96 %-100 %] 97 % (06/11 0530)  Physical Exam:  General: alert and cooperative Lochia: appropriate Uterine Fundus: firm Incision: healing well DVT Evaluation: No evidence of DVT seen on physical exam. Negative Homan's sign. No cords or calf tenderness. LLE noted with > edema, but negative Homan's. Normal color and warmth R leg patient is able to abduct and adduct this am, which is an improvement from pre delivery   Recent Labs  12/03/16 0615 12/04/16 0525  HGB 13.5 11.9*  HCT 39.7 34.8*    Assessment/Plan: Status post Cesarean section. Doing well postoperatively.  Continue current care. Desires circ prior to discharge, baby has not voided this am   Helma Argyle G 12/04/2016, 8:01 AM

## 2016-12-04 NOTE — Evaluation (Signed)
Physical Therapy Evaluation Patient Details Name: Tonya [name] MRN: [id number] DOB: Jan 22, 1986 Today's Date: 12/04/2016   History of Present Illness  31 y.o. female admitted to [facility] on 12/03/16 s/p c-section delivery of baby boy 2 weeks prior to his due date.  Pt had been struggling PTA for ~3 months with right leg pain, weakness, and dysfunction leading to a fall and having to use a RW/cane for safe mobility.  She reports a burning type pain that seems to originate from her buttocks/low back area that radiates all the way down her lateral thigh and into her gastroc/foot.  She reports feeling like some thing is breaking and has limited WB on this leg.  She also reports that she is having to now externally rotate it to decrease pain during gait.  She has been active with OP PT at [facility] office without much relief.  She is being follwed by Dr. Dion Saucier and was actually scheduled for a f/u appointment today (12/04/16).  Pt with significant PMhx of ADD, anxiety, and gestational DM.  This is her first child.   Clinical Impression  Pt with right leg pain and radicular symptoms that seem to be stemming from a higher source.  She was having to use a RW/cane PTA and had a fall due to leg pain and buckling.  She is likely having some nerve issues due to increased elastin during pregnancy.  Dr. Dion Saucier contacted to update on pt's progress and he is ordering some imaging.  If she/he chooses for her to continue to pursue OP PT, I would recommend a women's health specialist Eulis Foster, PT at [facility]) as she specializes in treating women post pregnancy.  She is getting along well enough with her RW that she will be safe to return home with her husband's assist, but may be safer caring for the baby from a WC level (to get around the house), so WC recommended for home use.  PT to sign off.  If any other questions come up please page me at the number listed below.     Follow Up Recommendations  Outpatient PT;Other (comment) (with women's health PT p cleared by OBGYN)    Equipment Recommendations  Wheelchair (measurements PT);Wheelchair cushion (measurements PT) (18x18 WC standard)    Recommendations for Other Services   NA     Precautions / Restrictions Precautions Precautions: Fall Precaution Comments: due to right leg pain and weakness.  Restrictions Weight Bearing Restrictions: No      Mobility  Bed Mobility Overal bed mobility: Modified Independent                Transfers Overall transfer level: Needs assistance Equipment used: Rolling walker (2 wheeled) Transfers: Sit to/from Stand Sit to Stand: Supervision         General transfer comment: supervision for safety  Ambulation/Gait Ambulation/Gait assistance: Supervision Ambulation Distance (Feet): 20 Feet Assistive device: Rolling walker (2 wheeled) Gait Pattern/deviations: Step-through pattern;Antalgic Gait velocity: decreased Gait velocity interpretation: Below normal speed for age/gender General Gait Details: Pt is very much shifted off of her right foot, lightly putting her foot flat during gait with RW and putting most of her weight on her left foot and bil arms.  no hyper extension moment or foot drag noted.  Per husband she externally rotates the right leg more than the left during gait, but not significantly when observed functionally and pt reports her pain feels better when she walks that way, so likely guarding/subconscious pain response.  Pertinent Vitals/Pain Pain Assessment: Faces Faces Pain Scale: Hurts a little bit Pain Location: right thigh Pain Descriptors / Indicators: Aching;Burning Pain Intervention(s): Limited activity within patient's tolerance;Monitored during session;Repositioned;Premedicated before session (per pt the pain meds for her c-section have helped with her )    Home Living Family/patient expects to be discharged to:: Private  residence Living Arrangements: Spouse/significant other Available Help at Discharge: Family;Available 24 hours/day Type of Home: House Home Access: Stairs to enter   [employer] of Steps: 3 Home Layout: One level Home Equipment: Walker - 2 wheels;Cane - single point      Prior Function Level of Independence: Independent with assistive device(s)         Comments: uses a walker and sometimes a cane for home and community mobility.  H/o one fall since this leg pain started 3 months ago.         Extremity/Trunk Assessment   Upper Extremity Assessment Upper Extremity Assessment: Overall WFL for tasks assessed    Lower Extremity Assessment Lower Extremity Assessment: RLE deficits/detail RLE Deficits / Details: right leg with difficulty lifting against gravity, no hyperextension or buckling noted with gait with RW.  No noticeable foot drag, but pt also very much shifting most of her weight onto her left leg and moderatly on her arms during short distance gait to the bathroom.  RLE Sensation:  (seems to be intact to LT, no reports of numbness.)    Cervical / Trunk Assessment Cervical / Trunk Assessment: Normal (no h/o lumbar problems)  Communication   Communication: No difficulties  Cognition Arousal/Alertness: Awake/alert Behavior During Therapy: WFL for tasks assessed/performed Overall Cognitive Status: Within Functional Limits for tasks assessed                                        General Comments General comments (skin integrity, edema, etc.): We spoke at length re- her symptoms, OP therapy, the fact that pt was supposed to go back to Dr. Mardelle Matte today and her progression/symptoms.  I suspect this is some kind of nerve issue that is stemming from her low back/pelvis due to the elasticity of her joints and ligaments during pregnancy.  I recommended she call and make a f/u appointment with Dr. Mardelle Matte as he will likely want to do imaging, and that she  see a women's health specialist PT who does more pre and post partum work on pelvis and low back Earlie Counts).  I then called Dr. Mardelle Matte and spoke with him personally and he is going to order some imaging while she is here in the hospital and f/u with her by phone tomorrow.          Assessment/Plan    PT Assessment All further PT needs can be met in the next venue of care  PT Problem List Decreased strength;Decreased activity tolerance;Decreased mobility;Decreased balance;Decreased knowledge of use of DME;Pain       PT Treatment Interventions DME instruction;Gait training;Functional mobility training;Therapeutic activities;Balance training;Patient/family education    PT Goals (Current goals can be found in the Care Plan section)  Acute Rehab PT Goals Patient Stated Goal: to get her right leg back and working normally again PT Goal Formulation: All assessment and education complete, DC therapy     AM-PAC PT "6 Clicks" Daily Activity  Outcome Measure Difficulty turning over in bed (including adjusting bedclothes, sheets and blankets)?: None Difficulty moving from lying on  back to sitting on the side of the bed? : None Difficulty sitting down on and standing up from a chair with arms (e.g., wheelchair, bedside commode, etc,.)?: None Help needed moving to and from a bed to chair (including a wheelchair)?: None Help needed walking in hospital room?: None Help needed climbing 3-5 steps with a railing? : A Little 6 Click Score: 23    End of Session   Activity Tolerance: Patient limited by pain Patient left: in bed;with call bell/phone within reach;with family/visitor present;with nursing/sitter in room Nurse Communication: Mobility status PT Visit Diagnosis: Muscle weakness (generalized) (M62.81);Difficulty in walking, not elsewhere classified (R26.2);Pain Pain - Right/Left: Right Pain - part of body: Leg    Time: 1355-1431 PT Time Calculation (min) (ACUTE ONLY): 36  min   Charges:         Wells Guiles B. Lia Vigilante, PT, DPT [phone]   PT Evaluation $PT Eval Moderate Complexity: 1 Procedure PT Treatments $Self Care/Home Management: 8-22   12/04/2016, 5:40 PM

## 2016-12-04 NOTE — Progress Notes (Signed)
Called by PT, patient known to me but never had imaging due to fetus, ordered xrays and will consider advanced imaging depending on findings.   Sounds lumbar in origin, but unusual that she can't bear weight.   Johnny Bridge, MD [phone]

## 2016-12-04 NOTE — Progress Notes (Signed)
Patient refused to continue wearing pulse ox because it wouldn't stay on her toe.  Pulse ox will be taken at each assessment.  Jt[place]s, rn

## 2016-12-04 NOTE — Lactation Note (Signed)
This note was copied from a baby's chart. Lactation Consultation Note  Patient Name: Tonya [name] [id number] Date: 12/04/2016 Reason for consult: Follow-up assessment;Difficult latch;Infant < 6lbs   Follow-up consult; GA 38.4; BW 5 lbs, 7.8 oz.   Infant having difficulty latching to breast so mom has been pumping (not getting any colostrum) and bottle feeding formula Neosure 22 cal. Reviewed hand expression with mom return demonstrating and colostrum easily flowing.  Explained she will need to use hands-on pumping during pumping and hand expression at end of pumping session. Mom was able to teach back "initiate" setting on pump as she has been using and she is turning dial up to 4 drops when she pumps.   Infant has breastfed x3 (54min) + formula via bottle x9 (10-46ml); voids-3 in 24 hrs; stools-3 in 24 hrs.   Mom stated she wanted to latch infant and breastfeed directly from breast.  LC suggested NS.  Parents very receptive to trying it. 5 [place] and NS obtained.  #24 NS fit mom's breast with mom sitting up but once latched baby had to decrease to #16 to fit baby and stay on mom's nipple.  Mom's nipple retracts with compressions but will evert with stimulation.  As baby learns to suck from breast and pull nipple into shield mom will need to increase in size.  Explained this to mom and she understood.  Mom has all 3 sizes in room. Infant latched to right breast football hold and took several sucks but kept coming off d/t lack of good positioning.  Mom somewhat laid-back in bed and having difficulty holding. Switched to left breast cross-cradle #16 shield with 5 french under shield and infant sucked in a consistent pattern.  Infant consumed ~10 ml at breast.  Remaining formula fed via bottle after breastfeeding. Taught dad how to set up, assist, and clean all parts. Anterior, tight, sublingual frenulum noted.  Did not discuss impact this may have on breastfeeding since goal for this feeding was  to get infant feeding from breast. Mom and baby would benefit from an OP Lactation visit after discharge.   Reviewed need to pump about 8 times per day hands-on with hand expression at end even with latching infant until infant is transferring milk directly from breast. Report given to RN.  RN will continue to assist during the night with feedings as needed.   Parents very appreciative of assistance and were pleased that infant fed directly from breast.     Maternal Data    Feeding Feeding Type: Breast Milk with Formula added Nipple Type: Slow - flow Length of feed: 15 min  LATCH Score/Interventions Latch: Grasps breast easily, tongue down, lips flanged, rhythmical sucking. (SNS with formula) Intervention(s): Skin to skin Intervention(s): Breast compression  Audible Swallowing: Spontaneous and intermittent  Type of Nipple: Everted at rest and after stimulation  Comfort (Breast/Nipple): Soft / non-tender     Hold (Positioning): Assistance needed to correctly position infant at breast and maintain latch. Intervention(s): Breastfeeding basics reviewed;Support Pillows;Position options;Skin to skin  LATCH Score: 9  Lactation Tools Discussed/Used Tools: Nipple Shields Nipple shield size: 16;20;24 (16 used for current feeding ) Breast pump type: Double-Electric Breast Pump WIC Program: No   Consult Status Consult Status: Follow-up Date: 12/05/16 Follow-up type: In-patient    Merlene Laughter 12/04/2016, 11:18 PM

## 2016-12-04 NOTE — Addendum Note (Signed)
Addendum  created 12/04/16 0803 by Jonna Munro, CRNA   Sign clinical note

## 2016-12-04 NOTE — Lactation Note (Signed)
This note was copied from a baby's chart. Lactation Consultation Note New mom unable to latch baby d/t edema. Baby very sleepy not cueing. Discussed newborn behavior and feeding habits.  DEBP  w/no colostrum noted. Mom has excessive edema to breast tissue, nipples flat. Baby stimulated given Neosure 22 cal. Formula. Feeding sheet according to hours of age given. Baby has mid anterior visible frenulum tie. When suckling on bottle nipple, doesn't have good seal w/bottom lips. Back wash of formula and salvia oozes around bottom lip.  Baby drank 10 ml from bottle, est. Lost 2 ml from leakage.  Mom so sleepy she can't hardly keep her eyes open. Encouraged to sleep after pumping.  Baby swaddled in crib bedside mom's bed.  Report given to RN. Patient Name: Tonya [name]'W Date: 12/04/2016 Reason for consult: Follow-up assessment;Difficult latch;Infant < 6lbs   Maternal Data    Feeding Feeding Type: Formula Nipple Type: Slow - flow  LATCH Score/Interventions                      Lactation Tools Discussed/Used Tools: Shells;Pump Shell Type: Inverted Breast pump type: Double-Electric Breast Pump Pump Review: Setup, frequency, and cleaning;Milk Storage Initiated by:: Allayne Stack RN IBCLC (pumped for 1st time) Date initiated:: 12/04/16   Consult Status Consult Status: Follow-up Date: 12/04/16 Follow-up type: In-patient    Theodoro Kalata 12/04/2016, 3:55 AM

## 2016-12-04 NOTE — Anesthesia Postprocedure Evaluation (Signed)
Anesthesia Post Note  Patient: Tonya [name]  Procedure(s) Performed: Procedure(s) (LRB): CESAREAN SECTION (N/A)     Patient location during evaluation: Mother Baby Anesthesia Type: Epidural Level of consciousness: awake and alert and oriented Pain management: pain level controlled Vital Signs Assessment: post-procedure vital signs reviewed and stable Respiratory status: spontaneous breathing and nonlabored ventilation Cardiovascular status: stable Postop Assessment: no headache, no backache, patient able to bend at knees, no signs of nausea or vomiting and adequate PO intake Anesthetic complications: no    Last Vitals:  Vitals:   12/04/16 0030 12/04/16 0530  BP:  104/62  Pulse:  85  Resp: 18 18  Temp: 36.9 C 36.8 C    Last Pain:  Vitals:   12/04/16 0641  TempSrc:   PainSc: 0-No pain   Pain Goal: Patients Stated Pain Goal: 0 (12/03/16 0542)               Willa Rough

## 2016-12-04 NOTE — Plan of Care (Signed)
Problem: Coping: Goal: Ability to cope will improve Outcome: Progressing Patient has good, consistent support system   Problem: Urinary Elimination: Goal: Ability to reestablish a normal urinary elimination pattern will improve Outcome: Progressing Patient diuresing well via foley catheter

## 2016-12-05 ENCOUNTER — Inpatient Hospital Stay (HOSPITAL_COMMUNITY): Payer: 59

## 2016-12-05 LAB — BIRTH TISSUE RECOVERY COLLECTION (PLACENTA DONATION)

## 2016-12-05 NOTE — Progress Notes (Signed)
Post Partum Day 2 Subjective: Still unable to bear weight on leg  Objective: Blood pressure 130/70, pulse 80, temperature 97.9 F (36.6 C), temperature source Oral, resp. rate 18, height 5' 5.5" (1.664 m), weight 210 lb (95.3 kg), last menstrual period 02/25/2016, SpO2 98 %, unknown if currently breastfeeding.  Physical Exam:  General: alert, cooperative and no distress Lochia: appropriate Uterine Fundus: firm Incision: healing well DVT Evaluation: No evidence of DVT seen on physical exam.   Recent Labs  12/03/16 0615 12/04/16 0525  HGB 13.5 11.9*  HCT 39.7 34.8*    Assessment/Plan: Plan for discharge tomorrow  Continue observation and orders per PT   LOS: 2 days   Tonya [name],Halee Glynn E 12/05/2016, 7:44 AM

## 2016-12-05 NOTE — Progress Notes (Signed)
XR done of hip and back, both negative.   I have spoken with the patient, and she also has excruciating pain with weightbearing around the right knee. I will go ahead and order x-rays of the right knee and evaluate. It is possible she may have transient osteoporosis of pregnancy, and could have an occult stress fracture somewhere in the right hip, femur, or knee, we'll see what the x-rays show of the knee, and consider advanced imaging either as an inpatient, or as an outpatient down the line.  Johnny Bridge, MD

## 2016-12-05 NOTE — Lactation Note (Signed)
This note was copied from a baby's chart. Lactation Consultation Note  Patient Name: Tonya [name] [id number] Date: 12/05/2016 Reason for consult: Follow-up assessment;Infant weight loss;Other (Comment) (1% weight loss , mom prefes to pump and bottle feeding for now until milk comes in ). LC reviewed and updated doc flow sheets.  Per mom since DEBP was set has pumped x 4 with no results as of yet. Since mom desires to pump and bottle feed for now Until milk comes in. [facility] discussed supply and demand and the importance of consistent pumping. Also could add one power pump per day ( 10 mins on / off for 60 mins, or 20 mins on 10 mins off over 60 mins ) to enhance milk coming in quicker.  Per mom the #24 Ainsley Spinner is a comfortable fit.  Mom receptive to discussion.  Since baby is 41 hours old - mentioned to mom to see if he will at least 30 ml per feeding and gradually increase.     Maternal Data Has patient been taught Hand Expression?:  (LC encouraged mom to to hand express )  Feeding Feeding Type:  (per mom baby recently fed ) Nipple Type: Slow - flow  LATCH Score/Interventions                Intervention(s): Breastfeeding basics reviewed     Lactation Tools Discussed/Used Tools: Pump (per mom using the #24 [place] and it was comfortable ) Breast pump type: Double-Electric Breast Pump (per mom has pumped x 4 since the DEBP set up with no milk yet )   Consult Status Consult Status: Follow-up Date: 12/06/16 Follow-up type: In-patient    [place] 12/05/2016, 3:14 PM

## 2016-12-06 ENCOUNTER — Other Ambulatory Visit: Payer: Self-pay | Admitting: Orthopedic Surgery

## 2016-12-06 ENCOUNTER — Ambulatory Visit (HOSPITAL_COMMUNITY)
Admission: RE | Admit: 2016-12-06 | Discharge: 2016-12-06 | Disposition: A | Discharge status: Home / Self Care | Payer: 59 | Source: Ambulatory Visit | Attending: Orthopedic Surgery | Admitting: Orthopedic Surgery

## 2016-12-06 DIAGNOSIS — M25561 Pain in right knee: Secondary | ICD-10-CM

## 2016-12-06 MED ORDER — IBUPROFEN 600 MG PO TABS: 600.0000 mg | ORAL_TABLET | Freq: Four times a day (QID) | ORAL | 0 refills | Status: DC

## 2016-12-06 MED ORDER — METHYLPREDNISOLONE 4 MG PO TBPK: ORAL_TABLET | ORAL | 0 refills | Status: DC

## 2016-12-06 MED ORDER — OXYCODONE-ACETAMINOPHEN 5-325 MG PO TABS: 1.0000 | ORAL_TABLET | ORAL | 0 refills | Status: DC | PRN

## 2016-12-06 MED FILL — IBUPROFEN 600 MG TABLET: 600 | 7 days supply | Qty: 30 | Fill #0

## 2016-12-06 MED FILL — METHYLPREDNISOLONE 4 MG TAB: 4 | 6 days supply | Qty: 21 | Fill #0

## 2016-12-06 MED FILL — OXYCODONE-ACETAMINOPHEN 5-3: 5-325 | 5 days supply | Qty: 30 | Fill #0

## 2016-12-06 NOTE — Progress Notes (Signed)
Appointment scheduled for today with Dr. Mardelle Matte at 245.  Pt informed by RN.    [name], DO

## 2016-12-06 NOTE — Discharge Instructions (Signed)
Call MD for T>100.4, heavy vaginal bleeding, severe abdominal pain, intractable nausea and/or vomiting, or respiratory distress.  Call office to schedule incision check in 1 weeks.  No driving while taking narcotics.  Pelvic rest x 6 weeks.

## 2016-12-06 NOTE — Discharge Summary (Signed)
Obstetric Discharge Summary Reason for Admission: rupture of membranes Prenatal Procedures: none Intrapartum Procedures: cesarean: low cervical, transverse Postpartum Procedures: none Complications-Operative and Postpartum: none Hemoglobin  Date Value Ref Range Status  12/04/2016 11.9 (L) 12.0 - 15.0 g/dL Final   HCT  Date Value Ref Range Status  12/04/2016 34.8 (L) 36.0 - 46.0 % Final    Physical Exam:  General: alert, cooperative and appears stated age 31: appropriate Uterine Fundus: firm Incision: healing well, no significant drainage, no dehiscence DVT Evaluation: No evidence of DVT seen on physical exam. Negative Homan's sign. No cords or calf tenderness.  Discharge Diagnoses: Term Pregnancy-delivered  Discharge Information: Date: 12/06/2016 Activity: pelvic rest Diet: routine Medications: PNV, Ibuprofen and Percocet Condition: stable Instructions: refer to practice specific booklet Discharge to: home   Newborn Data: Live born female  Birth Weight: 5 lb 7.8 oz (2490 g) APGAR: 9, 9  Home with mother.  Keshona Kartes, [place] 12/06/2016, 8:24 AM

## 2016-12-06 NOTE — Progress Notes (Signed)
Post Partum Day 3 Subjective: voiding, tolerating PO and + flatus.  Postop pain well controlled.  Patient continues to have severe right lower extremity pain and inability to weight bear without walker.  Currently, her pain in right knee and shin; previously right thigh.    Objective: Blood pressure 129/74, pulse 89, temperature 98.1 F (36.7 C), temperature source Oral, resp. rate 18, height 5' 5.5" (1.664 m), weight 210 lb (95.3 kg), last menstrual period 02/25/2016, SpO2 98 %, unknown if currently breastfeeding.  Physical Exam:  General: alert, cooperative and appears stated age Lochia: appropriate Uterine Fundus: firm Incision: healing well, no significant drainage, no dehiscence DVT Evaluation: No evidence of DVT seen on physical exam. Negative Homan's sign. No cords or calf tenderness.   Recent Labs  12/04/16 0525  HGB 11.9*  HCT 34.8*    Assessment/Plan: Discharge home and Breastfeeding  Will make sure ortho f/u is scheduled prior to discharge home today   LOS: 3 days   Tonya [name] 12/06/2016, 8:17 AM

## 2016-12-06 NOTE — Progress Notes (Signed)
Patient discharged per Dr. Lynnette Caffey' order and request for patient to transport self to [facility] instead of being transported by [facility] as an inpatient due to no medical necessity for medical transport.

## 2016-12-06 NOTE — Lactation Note (Signed)
This note was copied from a baby's chart. Lactation Consultation Note  Patient Name: Tonya [name] [id number] Date: 12/06/2016 Reason for consult: Follow-up assessment  Baby is 18 hours old, and mom  Has been pumping and bottle feeding.  Per mom tried to latch since yesterdays [place] visit with a Nipple Shield.  Mom has 2 NS for D/C. LC reviewed Supply and demand - and the importance of consistent pumping  At least 8 times a day. Sore nipple and engorgement prevention and tx reviewed.  Per mom will have a DEBP ( Medela ) at home.  LC offered to set up a LC O/P appt. And per mom requested to call back due to other  Doctors appt..  LC mentioned an appt in 5-7 days would be beneficial for when her milk is in.  Mother informed of post-discharge support and given phone number to the lactation department, including services for phone call assistance; out-patient appointments; and breastfeeding support group. List of other breastfeeding resources in the community given in the handout. Encouraged mother to call for problems or concerns related to breastfeeding.   Maternal Data Has patient been taught Hand Expression?: Yes (per mom has been shown and needs to feel more confident )  Feeding Feeding Type: Formula Nipple Type: Slow - flow  LATCH Score/Interventions                Intervention(s): Breastfeeding basics reviewed     Lactation Tools Discussed/Used Tools: Pump Breast pump type: Double-Electric Breast Pump (per mom has rented 4 times in the last 24 hours - with smal amount of colostrum )   Consult Status Consult Status: Complete (LC offered LCO/P appt. and mom requested to call back due to her other appts. ) Date: 12/06/16    Tonya [name] 12/06/2016, 9:47 AM

## 2016-12-06 NOTE — Progress Notes (Addendum)
XR with possible stress fracture, ordered MRI, could be done as inpatient or outpatient depending on availability.  CW crutches and NWB for now.   F/u with me after MRI completed.   Called and spoke with patient.   Johnny Bridge, MD

## 2016-12-07 LAB — TYPE AND SCREEN
ABO/RH(D): O NEG
Antibody Screen: POSITIVE
DAT, IGG: NEGATIVE
UNIT DIVISION: 0
UNIT DIVISION: 0
Unit division: 0
Unit division: 0

## 2016-12-07 LAB — BPAM RBC
BLOOD PRODUCT EXPIRATION DATE: 201806282359
Blood Product Expiration Date: 201806282359
Blood Product Expiration Date: 201807022359
Blood Product Expiration Date: 201807022359
ISSUE DATE / TIME: 201805270348
ISSUE DATE / TIME: 201805270348
ISSUE DATE / TIME: 201806111034
ISSUE DATE / TIME: 201806111401
UNIT TYPE AND RH: 9500
Unit Type and Rh: 9500
Unit Type and Rh: 9500
Unit Type and Rh: 9500

## 2016-12-08 DIAGNOSIS — M79651 Pain in right thigh: Secondary | ICD-10-CM | POA: Diagnosis not present

## 2016-12-25 DIAGNOSIS — S82141A Displaced bicondylar fracture of right tibia, initial encounter for closed fracture: Secondary | ICD-10-CM | POA: Diagnosis not present

## 2016-12-26 MED FILL — CEPHALEXIN 500 MG CAPSULE: 500 | 5 days supply | Qty: 10 | Fill #0

## 2017-01-18 DIAGNOSIS — Z1389 Encounter for screening for other disorder: Secondary | ICD-10-CM | POA: Diagnosis not present

## 2017-01-19 MED FILL — CLINDAMYCIN PHOSP 1% LOTION: 1 | 30 days supply | Qty: 60 | Fill #0

## 2017-01-19 MED FILL — NORG-EE 0.18-0.215-0.25/0.0: 0.18/0.215/ | 84 days supply | Qty: 84 | Fill #0

## 2017-01-22 DIAGNOSIS — S82141D Displaced bicondylar fracture of right tibia, subsequent encounter for closed fracture with routine healing: Secondary | ICD-10-CM | POA: Diagnosis not present

## 2017-02-06 DIAGNOSIS — R87615 Unsatisfactory cytologic smear of cervix: Secondary | ICD-10-CM | POA: Diagnosis not present

## 2017-02-06 DIAGNOSIS — Z13820 Encounter for screening for osteoporosis: Secondary | ICD-10-CM | POA: Diagnosis not present

## 2017-02-06 MED FILL — DAPSONE 5% GEL: 5 | 60 days supply | Qty: 60 | Fill #0

## 2017-03-16 DIAGNOSIS — L700 Acne vulgaris: Secondary | ICD-10-CM | POA: Diagnosis not present

## 2017-03-16 MED FILL — ACZONE 7.5% GEL PUMP: 7.5 | 60 days supply | Qty: 60 | Fill #0

## 2017-03-16 MED FILL — SPIRONOLACTONE 100 MG TAB: 100 | 30 days supply | Qty: 30 | Fill #0

## 2017-03-19 MED FILL — TRETINOIN 0.05% CREAM: 0.05 | 20 days supply | Qty: 45 | Fill #0

## 2017-04-09 MED FILL — NORG-EE 0.18-0.215-0.25/0.0: 0.18/0.215/ | 84 days supply | Qty: 84 | Fill #1

## 2017-04-19 ENCOUNTER — Ambulatory Visit (INDEPENDENT_AMBULATORY_CARE_PROVIDER_SITE_OTHER): Payer: 59 | Admitting: Internal Medicine

## 2017-04-19 ENCOUNTER — Other Ambulatory Visit: Payer: Self-pay | Admitting: *Deleted

## 2017-04-19 VITALS — BP 106/68 | HR 76 | Temp 97.5°F | Resp 16 | Ht 65.0 in | Wt 189.6 lb

## 2017-04-19 DIAGNOSIS — L0231 Cutaneous abscess of buttock: Secondary | ICD-10-CM

## 2017-04-19 DIAGNOSIS — L701 Acne conglobata: Secondary | ICD-10-CM | POA: Diagnosis not present

## 2017-04-19 DIAGNOSIS — N390 Urinary tract infection, site not specified: Secondary | ICD-10-CM | POA: Diagnosis not present

## 2017-04-19 MED ORDER — DOXYCYCLINE HYCLATE 100 MG PO CAPS: ORAL_CAPSULE | ORAL | 2 refills | Status: AC

## 2017-04-19 MED ORDER — AMPHETAMINE-DEXTROAMPHETAMINE 20 MG PO TABS: ORAL_TABLET | ORAL | 0 refills | Status: DC

## 2017-04-19 MED FILL — DOXYCYCLINE HYC 100 MG TAB: 100 | 23 days supply | Qty: 30 | Fill #0

## 2017-04-20 LAB — URINALYSIS, ROUTINE W REFLEX MICROSCOPIC
Bilirubin Urine: NEGATIVE
Glucose, UA: NEGATIVE
HYALINE CAST: NONE SEEN /LPF
KETONES UR: NEGATIVE
Nitrite: NEGATIVE
PROTEIN: NEGATIVE
Specific Gravity, Urine: 1.009 (ref 1.001–1.03)
pH: 6.5 (ref 5.0–8.0)

## 2017-04-20 MED FILL — AMPHETAMINE SALTS 20 MG TAB: 20 | 20 days supply | Qty: 40 | Fill #0

## 2017-04-22 ENCOUNTER — Encounter: Payer: Self-pay | Admitting: Internal Medicine

## 2017-04-22 ENCOUNTER — Other Ambulatory Visit: Payer: Self-pay | Admitting: Internal Medicine

## 2017-04-22 DIAGNOSIS — N390 Urinary tract infection, site not specified: Secondary | ICD-10-CM

## 2017-04-22 LAB — URINE CULTURE
MICRO NUMBER: 81197883
SPECIMEN QUALITY: ADEQUATE

## 2017-04-22 MED ORDER — CIPROFLOXACIN HCL 500 MG PO TABS: ORAL_TABLET | ORAL | 0 refills | Status: AC

## 2017-04-22 NOTE — Progress Notes (Signed)
  Subjective:    Patient ID: Tonya [name], female    DOB: 04/06/1986, 31 y.o.   MRN: [id number]  HPI  This very nice 31 yo [name] presents with c/o urinsry frequency/urgency & dysuria. Also was "pimples " of her Lt buttock area. Lastly she has hx/o ADD and had stopped her Adderall during pregnancy and now desires to restart it.   Medication Sig  . Ibuprofen 600 MG tablet Take 1 tablet (600 mg total) by mouth every 6 (six) hours.  . Omega-3 FISH OIL  Take 1 capsule by mouth daily.  Marland Kitchen PRENATAL MULTIVITAMIN Take 1 tablet by mouth at bedtime.   No Known Allergies  Past Medical History:  Diagnosis Date  . ADD (attention deficit disorder)   . Anxiety   . Gestational diabetes   . Hyperlipidemia    Past Surgical History:  Procedure Laterality Date  . CESAREAN SECTION N/A 12/03/2016   Procedure: CESAREAN SECTION;  Surgeon: Dian Queen, MD;  Location: [place];  Service: Obstetrics;  Laterality: N/A;  . WISDOM TOOTH EXTRACTION     Review of Systems   10 point systems review negative except as above.    Objective:   Physical Exam  BP 106/68   Pulse 76   Temp (!) 97.5 F (36.4 C)   Resp 16   Ht 5\' 5"  (1.651 m)   Wt 189 lb 9.6 oz (86 kg)   BMI 31.55 kg/m   HEENT - WNL. Neck - supple.  Chest - Clear equal BS. Cor - Nl HS. RRR w/o sig m. MS- FROM w/o deformities.  Gait Nl. Neuro -  Nl w/o focal abnormalities. Skin - Several small non indurated pustules of the Lt Buttock.    Assessment & Plan:   1. Abscess of multiple sites of buttock  - doxycycline (VIBRAMYCIN) 100 MG capsule; Take 1 capsule 2 x / day for 1 week then 1 capsule daily with food  Dispense: 30 capsule; Refill: 2  2. Urinary tract infection without hematuria, site unspecified  - Urinalysis, Routine w reflex microscopic - Urine Culture  3. Acne conglobata  - doxycycline (VIBRAMYCIN) 100 MG capsule; Take 1 capsule 2 x / day for 1 week then 1 capsule daily with food  Dispense: 30 capsule;  Refill: 2

## 2017-04-23 MED FILL — CIPROFLOXACIN HCL 500 MG TA: 500 | 7 days supply | Qty: 14 | Fill #0

## 2017-04-24 ENCOUNTER — Ambulatory Visit: Payer: Self-pay | Admitting: Physician Assistant

## 2017-05-11 DIAGNOSIS — L700 Acne vulgaris: Secondary | ICD-10-CM | POA: Diagnosis not present

## 2017-05-28 ENCOUNTER — Encounter: Payer: Self-pay | Admitting: Internal Medicine

## 2017-05-30 NOTE — Progress Notes (Signed)
Complete Physical  Assessment and Plan:  Tonya [name] was seen today for annual exam.  Diagnoses and all orders for this visit:  Routine general medical examination at a health care facility  Anxiety Previously treated by prozac - she reports severe weight gain with this - has been managed by psych; discussed buspar which she has not tried - she will look into this and contact back  Stress management techniques discussed, increase water, good sleep hygiene discussed, increase exercise, and increase veggies.   Attention deficit disorder (ADD) without hyperactivity Continue medications  Helps with focus, no AE's. The patient was counseled on the addictive nature of the medication and was encouraged to take drug holidays when not needed.   Mixed hyperlipidemia Not currently on treatment - Continue low cholesterol diet and exercise.  Check lipid panel. -     Lipid panel  S/P cesarean section      -      Doing well; followed by GYN, scar healing well   Screening for deficiency anemia -     CBC with Differential/Platelet -     Iron,Total/Total Iron Binding Cap -     Vitamin B12  Screening for diabetes mellitus -     Hemoglobin A1c  Screening for hematuria or proteinuria -     Urinalysis w microscopic + reflex cultur  Screening for thyroid disorder -     TSH  Vitamin D deficiency -     VITAMIN D 25 Hydroxy (Vit-D Deficiency, Fractures)  Medication management -     CBC with Differential/Platelet -     BASIC METABOLIC PANEL WITH GFR -     Hepatic function panel   Discussed med's effects and SE's. Screening labs and tests as requested with regular follow-up as recommended. Over 40 minutes of exam, counseling, chart review, and complex, high level critical decision making was performed this visit.   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 She is s/p cesarean section of her first child this past June for her first child; received PAP already this year after her delivery. She is no longer breast feeding. She reports she experienced severe cystic acne while pregnant and has been treated by dermatology with retin-A; there have been discussions of Accutane. She has restarted adderall for focus while at work. Previously treated for anxiety by psych with SSRI and PRN benzo; she reports she is managing fairly off of medication - does not wish to restart at this time due to severe weight gain on SSRI - discussed alternatives today.   BMI is Body mass index is 31.28 kg/m., she has been working on diet and exercise. Exercises 3 times a week  Wt Readings from Last 3 Encounters:  05/31/17 188 lb (85.3 kg)  04/19/17 189 lb 9.6 oz (86 kg)  12/03/16 210 lb (95.3 kg)   Today their BP is BP: 128/76 She does not workout. She denies chest pain, shortness of breath, dizziness.   She is not on cholesterol medication and denies myalgias. Her cholesterol is not at goal. The cholesterol last visit was:   Lab Results  Component Value Date   CHOL  184 05/31/2015   HDL 42 (L) 05/31/2015   LDLCALC 109 05/31/2015   TRIG 165 (H) 05/31/2015   CHOLHDL 4.4 05/31/2015   She has a hx of gestaional diabetes;  Last A1C in the office was:  Lab Results  Component Value Date   HGBA1C 5.4 05/31/2015   Last GFR: Lab Results  Component Value Date   GFRNONAA >89 03/13/2016   Patient is not on Vitamin D supplement and was below goal at last check:    Lab Results  Component Value Date   VD25OH 34 05/31/2015      Current Medications:  Current Outpatient Medications on File Prior to Visit  Medication Sig Dispense Refill   . Dapsone (ACZONE) 7.5 % GEL Apply topically. Daily at bedtime    . norethindrone-ethinyl estradiol (CYCLAFEM,ALYACEN) 0.5/0.75/1-35 MG-MCG tablet Take 1 tablet by mouth daily.    Marland Kitchen tretinoin (RETIN-A) 0.025 % cream Apply topically at bedtime.    Marland Kitchen doxycycline (VIBRAMYCIN) 100 MG capsule Take 1 capsule 2 x / day for 1 week then 1 capsule daily with food (Patient not taking: Reported on 05/31/2017) 30 capsule 2   No current facility-administered medications on file prior to visit.    Allergies:  No Known Allergies Medical History:  She has Anxiety; ADD (attention deficit disorder); Anemia; Hyperlipidemia; and S/P cesarean section on their problem list. Health Maintenance:   Immunization History  Administered Date(s) Administered  . Influenza Split 04/17/2013   Tetanus: 2016 Flu vaccine: 2018  LMP: Patient's last menstrual period was 05/03/2017 (approximate). Pap: 2018 - at GYN MGM: n/a  Colonoscopy: n/a EGD: n/a  Last Dental Exam: 03/2017 - q6 months Last Eye Exam: Scheduled for this year - goes annually   Patient Care Team: Unk Pinto, MD as PCP - General (Internal Medicine) Vernell Morgans, MD as Referring Physician (Obstetrics and Gynecology)  Surgical History:  She has a past surgical history that includes Wisdom tooth extraction and Cesarean section (N/A, 12/03/2016). Family History:  Herfamily history includes Cancer in her father; Depression in her mother; Hypertension in her father and mother. Social History:  She reports that  has never smoked. she has never used smokeless tobacco. She reports that she does not drink alcohol or use drugs.  Review of Systems: Review of Systems  Constitutional: Negative for malaise/fatigue and weight loss.  HENT: Negative for hearing loss and tinnitus.   Eyes: Negative for blurred vision and double vision.  Respiratory: Negative for cough, shortness of breath and wheezing.   Cardiovascular: Negative for chest pain,  palpitations, orthopnea, claudication and leg swelling.  Gastrointestinal: Negative for abdominal pain, blood in stool, constipation, diarrhea, heartburn, melena, nausea and vomiting.  Genitourinary: Negative.   Musculoskeletal: Negative for joint pain and myalgias.  Skin: Negative for rash.  Neurological: Negative for dizziness, tingling, sensory change, weakness and headaches.  Endo/Heme/Allergies: Negative for polydipsia.  Psychiatric/Behavioral: Negative.   All other systems reviewed and are negative.   Physical Exam: Estimated body mass index is 31.28 kg/m as calculated from the following:   Height as of this encounter: 5\' 5"  (1.651 m).   Weight as of this encounter: 188 lb (85.3 kg). BP 128/76   Pulse (!) 110   Temp (!) 97.5 F (36.4 C)   Ht 5\' 5"  (1.651 m)   Wt 188 lb (85.3 kg)   LMP 05/03/2017 (Approximate)   SpO2 98%   Breastfeeding? No   BMI 31.28 kg/m  General Appearance: Well nourished, in no apparent distress.  Eyes: PERRLA, EOMs,  conjunctiva no swelling or erythema, normal fundi and vessels.  Sinuses: No Frontal/maxillary tenderness  ENT/Mouth: Ext aud canals clear, normal light reflex with TMs without erythema, bulging. Good dentition. No erythema, swelling, or exudate on post pharynx. Tonsils not swollen or erythematous. Hearing normal.  Neck: Supple, thyroid normal. No bruits  Respiratory: Respiratory effort normal, BS equal bilaterally without rales, rhonchi, wheezing or stridor.  Cardio: RRR without murmurs, rubs or gallops. Brisk peripheral pulses without edema.  Chest: symmetric, with normal excursions and percussion.  Breasts: Defer to GYN Abdomen: Soft, nontender, no guarding, rebound, hernias, masses, or organomegaly.  Lymphatics: Non tender without lymphadenopathy.  Genitourinary: Defer to GYN Musculoskeletal: Full ROM all peripheral extremities,5/5 strength, and normal gait.  Skin: Warm, dry without rashes, lesions, ecchymosis. C - sect scar without  keloids/abcesses - pink - healing well.  Neuro: Cranial nerves intact, reflexes equal bilaterally. Normal muscle tone, no cerebellar symptoms. Sensation intact.  Psych: Awake and oriented X 3, normal affect, Insight and Judgment appropriate.   EKG: Normal EKG 2016 reviewed - no new concerns, defer  Tonya [name] 4:06 PM [facility]

## 2017-05-31 ENCOUNTER — Encounter: Payer: Self-pay | Admitting: Adult Health

## 2017-05-31 ENCOUNTER — Ambulatory Visit (INDEPENDENT_AMBULATORY_CARE_PROVIDER_SITE_OTHER): Payer: 59 | Admitting: Adult Health

## 2017-05-31 VITALS — BP 128/76 | HR 110 | Temp 97.5°F | Ht 65.0 in | Wt 188.0 lb

## 2017-05-31 DIAGNOSIS — Z0000 Encounter for general adult medical examination without abnormal findings: Secondary | ICD-10-CM

## 2017-05-31 DIAGNOSIS — E559 Vitamin D deficiency, unspecified: Secondary | ICD-10-CM | POA: Diagnosis not present

## 2017-05-31 DIAGNOSIS — D649 Anemia, unspecified: Secondary | ICD-10-CM | POA: Diagnosis not present

## 2017-05-31 DIAGNOSIS — Z1389 Encounter for screening for other disorder: Secondary | ICD-10-CM

## 2017-05-31 DIAGNOSIS — Z79899 Other long term (current) drug therapy: Secondary | ICD-10-CM

## 2017-05-31 DIAGNOSIS — Z130 Encounter for screening for diseases of the blood and blood-forming organs and certain disorders involving the immune mechanism: Secondary | ICD-10-CM

## 2017-05-31 DIAGNOSIS — E782 Mixed hyperlipidemia: Secondary | ICD-10-CM

## 2017-05-31 DIAGNOSIS — Z98891 History of uterine scar from previous surgery: Secondary | ICD-10-CM

## 2017-05-31 DIAGNOSIS — Z1329 Encounter for screening for other suspected endocrine disorder: Secondary | ICD-10-CM

## 2017-05-31 DIAGNOSIS — Z131 Encounter for screening for diabetes mellitus: Secondary | ICD-10-CM

## 2017-05-31 DIAGNOSIS — F988 Other specified behavioral and emotional disorders with onset usually occurring in childhood and adolescence: Secondary | ICD-10-CM

## 2017-05-31 DIAGNOSIS — F419 Anxiety disorder, unspecified: Secondary | ICD-10-CM

## 2017-05-31 MED ORDER — AMPHETAMINE-DEXTROAMPHETAMINE 20 MG PO TABS: ORAL_TABLET | ORAL | 0 refills | Status: DC

## 2017-05-31 NOTE — Patient Instructions (Signed)
Preventive Care for Adults  A healthy lifestyle and preventive care can promote health and wellness. Preventive health guidelines for women include the following key practices.  A routine yearly physical is a good way to check with your health care provider about your health and preventive screening. It is a chance to share any concerns and updates on your health and to receive a thorough exam.  Visit your dentist for a routine exam and preventive care every 6 months. Brush your teeth twice a day and floss once a day. Good oral hygiene prevents tooth decay and gum disease.  The frequency of eye exams is based on your age, health, family medical history, use of contact lenses, and other factors. Follow your health care provider's recommendations for frequency of eye exams.  Eat a healthy diet. Foods like vegetables, fruits, whole grains, low-fat dairy products, and lean protein foods contain the nutrients you need without too many calories. Decrease your intake of foods high in solid fats, added sugars, and salt. Eat the right amount of calories for you.Get information about a proper diet from your health care provider, if necessary.  Regular physical exercise is one of the most important things you can do for your health. Most adults should get at least 150 minutes of moderate-intensity exercise (any activity that increases your heart rate and causes you to sweat) each week. In addition, most adults need muscle-strengthening exercises on 2 or more days a week.  Maintain a healthy weight. The body mass index (BMI) is a screening tool to identify possible weight problems. It provides an estimate of body fat based on height and weight. Your health care provider can find your BMI and can help you achieve or maintain a healthy weight.For adults 20 years and older:  A BMI below 18.5 is considered underweight.  A BMI of 18.5 to 24.9 is normal.  A BMI of 25 to 29.9 is considered overweight.  A BMI of  30 and above is considered obese.  Maintain normal blood lipids and cholesterol levels by exercising and minimizing your intake of saturated fat. Eat a balanced diet with plenty of fruit and vegetables. Blood tests for lipids and cholesterol should begin at age 20 and be repeated every 5 years. If your lipid or cholesterol levels are high, you are over 50, or you are at high risk for heart disease, you may need your cholesterol levels checked more frequently.Ongoing high lipid and cholesterol levels should be treated with medicines if diet and exercise are not working.  If you smoke, find out from your health care provider how to quit. If you do not use tobacco, do not start.  Lung cancer screening is recommended for adults aged 55-80 years who are at high risk for developing lung cancer because of a history of smoking. A yearly low-dose CT scan of the lungs is recommended for people who have at least a 30-pack-year history of smoking and are a current smoker or have quit within the past 15 years. A pack year of smoking is smoking an average of 1 pack of cigarettes a day for 1 year (for example: 1 pack a day for 30 years or 2 packs a day for 15 years). Yearly screening should continue until the smoker has stopped smoking for at least 15 years. Yearly screening should be stopped for people who develop a health problem that would prevent them from having lung cancer treatment.  If you are pregnant, do not drink alcohol. If you are   breastfeeding, be very cautious about drinking alcohol. If you are not pregnant and choose to drink alcohol, do not have more than 1 drink per day. One drink is considered to be 12 ounces (355 mL) of beer, 5 ounces (148 mL) of wine, or 1.5 ounces (44 mL) of liquor.  Avoid use of street drugs. Do not share needles with anyone. Ask for help if you need support or instructions about stopping the use of drugs.  High blood pressure causes heart disease and increases the risk of  stroke. Your blood pressure should be checked at least every 1 to 2 years. Ongoing high blood pressure should be treated with medicines if weight loss and exercise do not work.  If you are 3-31 years old, ask your health care provider if you should take aspirin to prevent strokes.  Diabetes screening involves taking a blood sample to check your fasting blood sugar level. This should be done once every 3 years, after age 31, if you are within normal weight and without risk factors for diabetes. Testing should be considered at a younger age or be carried out more frequently if you are overweight and have at least 1 risk factor for diabetes.  Breast cancer screening is essential preventive care for women. You should practice "breast self-awareness." This means understanding the normal appearance and feel of your breasts and may include breast self-examination. Any changes detected, no matter how small, should be reported to a health care provider. Women in their 76s and 30s should have a clinical breast exam (CBE) by a health care provider as part of a regular health exam every 1 to 3 years. After age 65, women should have a CBE every year. Starting at age 67, women should consider having a mammogram (breast X-ray test) every year. Women who have a family history of breast cancer should talk to their health care provider about genetic screening. Women at a high risk of breast cancer should talk to their health care providers about having an MRI and a mammogram every year.  Breast cancer gene (BRCA)-related cancer risk assessment is recommended for women who have family members with BRCA-related cancers. BRCA-related cancers include breast, ovarian, tubal, and peritoneal cancers. Having family members with these cancers may be associated with an increased risk for harmful changes (mutations) in the breast cancer genes BRCA1 and BRCA2. Results of the assessment will determine the need for genetic counseling and  BRCA1 and BRCA2 testing.  Routine pelvic exams to screen for cancer are no longer recommended for nonpregnant women who are considered low risk for cancer of the pelvic organs (ovaries, uterus, and vagina) and who do not have symptoms. Ask your health care provider if a screening pelvic exam is right for you.  If you have had past treatment for cervical cancer or a condition that could lead to cancer, you need Pap tests and screening for cancer for at least 20 years after your treatment. If Pap tests have been discontinued, your risk factors (such as having a new sexual partner) need to be reassessed to determine if screening should be resumed. Some women have medical problems that increase the chance of getting cervical cancer. In these cases, your health care provider may recommend more frequent screening and Pap tests.  The HPV test is an additional test that may be used for cervical cancer screening. The HPV test looks for the virus that can cause the cell changes on the cervix. The cells collected during the Pap test can  be tested for HPV. The HPV test could be used to screen women aged 98 years and older, and should be used in women of any age who have unclear Pap test results. After the age of 59, women should have HPV testing at the same frequency as a Pap test.  Colorectal cancer can be detected and often prevented. Most routine colorectal cancer screening begins at the age of 35 years and continues through age 61 years. However, your health care provider may recommend screening at an earlier age if you have risk factors for colon cancer. On a yearly basis, your health care provider may provide home test kits to check for hidden blood in the stool. Use of a small camera at the end of a tube, to directly examine the colon (sigmoidoscopy or colonoscopy), can detect the earliest forms of colorectal cancer. Talk to your health care provider about this at age 18, when routine screening begins. Direct  exam of the colon should be repeated every 5-10 years through age 67 years, unless early forms of pre-cancerous polyps or small growths are found.  People who are at an increased risk for hepatitis B should be screened for this virus. You are considered at high risk for hepatitis B if:  You were born in a country where hepatitis B occurs often. Talk with your health care provider about which countries are considered high risk.  Your parents were born in a high-risk country and you have not received a shot to protect against hepatitis B (hepatitis B vaccine).  You have HIV or AIDS.  You use needles to inject street drugs.  You live with, or have sex with, someone who has hepatitis B.  You get hemodialysis treatment.  You take certain medicines for conditions like cancer, organ transplantation, and autoimmune conditions.  Hepatitis C blood testing is recommended for all people born from [birth year] through 1965 and any individual with known risks for hepatitis C.  Practice safe sex. Use condoms and avoid high-risk sexual practices to reduce the spread of sexually transmitted infections (STIs). STIs include gonorrhea, chlamydia, syphilis, trichomonas, herpes, HPV, and human immunodeficiency virus (HIV). Herpes, HIV, and HPV are viral illnesses that have no cure. They can result in disability, cancer, and death.  You should be screened for sexually transmitted illnesses (STIs) including gonorrhea and chlamydia if:  You are sexually active and are younger than 24 years.  You are older than 24 years and your health care provider tells you that you are at risk for this type of infection.  Your sexual activity has changed since you were last screened and you are at an increased risk for chlamydia or gonorrhea. Ask your health care provider if you are at risk.  If you are at risk of being infected with HIV, it is recommended that you take a prescription medicine daily to prevent HIV infection. This is  called preexposure prophylaxis (PrEP). You are considered at risk if:  You are a heterosexual woman, are sexually active, and are at increased risk for HIV infection.  You take drugs by injection.  You are sexually active with a partner who has HIV.  Talk with your health care provider about whether you are at high risk of being infected with HIV. If you choose to begin PrEP, you should first be tested for HIV. You should then be tested every 3 months for as long as you are taking PrEP.  Osteoporosis is a disease in which the bones lose minerals and  strength with aging. This can result in serious bone fractures or breaks. The risk of osteoporosis can be identified using a bone density scan. Women ages 48 years and over and women at risk for fractures or osteoporosis should discuss screening with their health care providers. Ask your health care provider whether you should take a calcium supplement or vitamin D to reduce the rate of osteoporosis.  Menopause can be associated with physical symptoms and risks. Hormone replacement therapy is available to decrease symptoms and risks. You should talk to your health care provider about whether hormone replacement therapy is right for you.  Use sunscreen. Apply sunscreen liberally and repeatedly throughout the day. You should seek shade when your shadow is shorter than you. Protect yourself by wearing long sleeves, pants, a wide-brimmed hat, and sunglasses year round, whenever you are outdoors.  Once a month, do a whole body skin exam, using a mirror to look at the skin on your back. Tell your health care provider of new moles, moles that have irregular borders, moles that are larger than a pencil eraser, or moles that have changed in shape or color.  Stay current with required vaccines (immunizations).  Influenza vaccine. All adults should be immunized every year.  Tetanus, diphtheria, and acellular pertussis (Td, Tdap) vaccine. Pregnant women should  receive 1 dose of Tdap vaccine during each pregnancy. The dose should be obtained regardless of the length of time since the last dose. Immunization is preferred during the 27th-36th week of gestation. An adult who has not previously received Tdap or who does not know her vaccine status should receive 1 dose of Tdap. This initial dose should be followed by tetanus and diphtheria toxoids (Td) booster doses every 10 years. Adults with an unknown or incomplete history of completing a 3-dose immunization series with Td-containing vaccines should begin or complete a primary immunization series including a Tdap dose. Adults should receive a Td booster every 10 years.  Varicella vaccine. An adult without evidence of immunity to varicella should receive 2 doses or a second dose if she has previously received 1 dose. Pregnant females who do not have evidence of immunity should receive the first dose after pregnancy. This first dose should be obtained before leaving the health care facility. The second dose should be obtained 4-8 weeks after the first dose.  Human papillomavirus (HPV) vaccine. Females aged 13-26 years who have not received the vaccine previously should obtain the 3-dose series. The vaccine is not recommended for use in pregnant females. However, pregnancy testing is not needed before receiving a dose. If a female is found to be pregnant after receiving a dose, no treatment is needed. In that case, the remaining doses should be delayed until after the pregnancy. Immunization is recommended for any person with an immunocompromised condition through the age of 48 years if she did not get any or all doses earlier. During the 3-dose series, the second dose should be obtained 4-8 weeks after the first dose. The third dose should be obtained 24 weeks after the first dose and 16 weeks after the second dose.  Zoster vaccine. One dose is recommended for adults aged 67 years or older unless certain conditions are  present.  Measles, mumps, and rubella (MMR) vaccine. Adults born before [birth year] generally are considered immune to measles and mumps. Adults born in [birth year] or later should have 1 or more doses of MMR vaccine unless there is a contraindication to the vaccine or there is laboratory evidence of immunity  to each of the three diseases. A routine second dose of MMR vaccine should be obtained at least 28 days after the first dose for students attending postsecondary schools, health care workers, or international travelers. People who received inactivated measles vaccine or an unknown type of measles vaccine during 1963-1967 should receive 2 doses of MMR vaccine. People who received inactivated mumps vaccine or an unknown type of mumps vaccine before 1979 and are at high risk for mumps infection should consider immunization with 2 doses of MMR vaccine. For females of childbearing age, rubella immunity should be determined. If there is no evidence of immunity, females who are not pregnant should be vaccinated. If there is no evidence of immunity, females who are pregnant should delay immunization until after pregnancy. Unvaccinated health care workers born before [birth year] who lack laboratory evidence of measles, mumps, or rubella immunity or laboratory confirmation of disease should consider measles and mumps immunization with 2 doses of MMR vaccine or rubella immunization with 1 dose of MMR vaccine.  Pneumococcal 13-valent conjugate (PCV13) vaccine. When indicated, a person who is uncertain of her immunization history and has no record of immunization should receive the PCV13 vaccine. An adult aged 58 years or older who has certain medical conditions and has not been previously immunized should receive 1 dose of PCV13 vaccine. This PCV13 should be followed with a dose of pneumococcal polysaccharide (PPSV23) vaccine. The PPSV23 vaccine dose should be obtained at least 8 weeks after the dose of PCV13 vaccine. An adult aged 65  years or older who has certain medical conditions and previously received 1 or more doses of PPSV23 vaccine should receive 1 dose of PCV13. The PCV13 vaccine dose should be obtained 1 or more years after the last PPSV23 vaccine dose.  Pneumococcal polysaccharide (PPSV23) vaccine. When PCV13 is also indicated, PCV13 should be obtained first. All adults aged 18 years and older should be immunized. An adult younger than age 74 years who has certain medical conditions should be immunized. Any person who resides in a nursing home or long-term care facility should be immunized. An adult smoker should be immunized. People with an immunocompromised condition and certain other conditions should receive both PCV13 and PPSV23 vaccines. People with human immunodeficiency virus (HIV) infection should be immunized as soon as possible after diagnosis. Immunization during chemotherapy or radiation therapy should be avoided. Routine use of PPSV23 vaccine is not recommended for American Indians, [place] Natives, or people younger than 65 years unless there are medical conditions that require PPSV23 vaccine. When indicated, people who have unknown immunization and have no record of immunization should receive PPSV23 vaccine. One-time revaccination 5 years after the first dose of PPSV23 is recommended for people aged 19-64 years who have chronic kidney failure, nephrotic syndrome, asplenia, or immunocompromised conditions. People who received 1-2 doses of PPSV23 before age 21 years should receive another dose of PPSV23 vaccine at age 42 years or later if at least 5 years have passed since the previous dose. Doses of PPSV23 are not needed for people immunized with PPSV23 at or after age 61 years.  Meningococcal vaccine. Adults with asplenia or persistent complement component deficiencies should receive 2 doses of quadrivalent meningococcal conjugate (MenACWY-D) vaccine. The doses should be obtained at least 2 months apart.  Microbiologists working with certain meningococcal bacteria, [employer], people at risk during an outbreak, and people who travel to or live in countries with a high rate of meningitis should be immunized. A first-year college student up through  age 23 years who is living in a residence hall should receive a dose if she did not receive a dose on or after her 16th birthday. Adults who have certain high-risk conditions should receive one or more doses of vaccine.  Hepatitis A vaccine. Adults who wish to be protected from this disease, have certain high-risk conditions, work with hepatitis A-infected animals, work in hepatitis A research labs, or travel to or work in countries with a high rate of hepatitis A should be immunized. Adults who were previously unvaccinated and who anticipate close contact with an international adoptee during the first 60 days after arrival in the [place] from a country with a high rate of hepatitis A should be immunized.  Hepatitis B vaccine. Adults who wish to be protected from this disease, have certain high-risk conditions, may be exposed to blood or other infectious body fluids, are household contacts or sex partners of hepatitis B positive people, are clients or workers in certain care facilities, or travel to or work in countries with a high rate of hepatitis B should be immunized.  Haemophilus influenzae type b (Hib) vaccine. A previously unvaccinated person with asplenia or sickle cell disease or having a scheduled splenectomy should receive 1 dose of Hib vaccine. Regardless of previous immunization, a recipient of a hematopoietic stem cell transplant should receive a 3-dose series 6-12 months after her successful transplant. Hib vaccine is not recommended for adults with HIV infection. Preventive Services / Frequency  Ages 65 to 22 years  Blood pressure check.  Lipid and cholesterol check.  Clinical breast exam.** / Every 3 years for women in their 53s  and 35s.  BRCA-related cancer risk assessment.** / For women who have family members with a BRCA-related cancer (breast, ovarian, tubal, or peritoneal cancers).  Pap test.** / Every 2 years from ages 41 through 44. Every 3 years starting at age 64 through age 28 or 21 with a history of 3 consecutive normal Pap tests.  HPV screening.** / Every 3 years from ages 9 through ages 56 to 48 with a history of 3 consecutive normal Pap tests.  Hepatitis C blood test.** / For any individual with known risks for hepatitis C.  Skin self-exam. / Monthly.  Influenza vaccine. / Every year.  Tetanus, diphtheria, and acellular pertussis (Tdap, Td) vaccine.** / Consult your health care provider. Pregnant women should receive 1 dose of Tdap vaccine during each pregnancy. 1 dose of Td every 10 years.  Varicella vaccine.** / Consult your health care provider. Pregnant females who do not have evidence of immunity should receive the first dose after pregnancy.  HPV vaccine. / 3 doses over 6 months, if 67 and younger. The vaccine is not recommended for use in pregnant females. However, pregnancy testing is not needed before receiving a dose.  Measles, mumps, rubella (MMR) vaccine.** / You need at least 1 dose of MMR if you were born in 1957 or later. You may also need a 2nd dose. For females of childbearing age, rubella immunity should be determined. If there is no evidence of immunity, females who are not pregnant should be vaccinated. If there is no evidence of immunity, females who are pregnant should delay immunization until after pregnancy.  Pneumococcal 13-valent conjugate (PCV13) vaccine.** / Consult your health care provider.  Pneumococcal polysaccharide (PPSV23) vaccine.** / 1 to 2 doses if you smoke cigarettes or if you have certain conditions.  Meningococcal vaccine.** / 1 dose if you are age 61 to 21 years and  a Market researcher living in a residence hall, or have one of several medical  conditions, you need to get vaccinated against meningococcal disease. You may also need additional booster doses.  Hepatitis A vaccine.** / Consult your health care provider.  Hepatitis B vaccine.** / Consult your health care provider.  Haemophilus influenzae type b (Hib) vaccine.** / Consult your health care provider. ++++++++ Recommend Adult Low Dose Aspirin or  coated  Aspirin 81 mg daily  To reduce risk of Colon Cancer 20 %,  Skin Cancer 26 % ,  Melanoma 46%  and  Pancreatic cancer 60% ++++++++++++++++++ Vitamin D goal  is between 70-100.  Please make sure that you are taking your Vitamin D as directed.  It is very important as a natural anti-inflammatory  helping hair, skin, and nails, as well as reducing stroke and heart attack risk.  It helps your bones and helps with mood. It also decreases numerous cancer risks so please take it as directed.  Low Vit D is associated with a 200-300% higher risk for CANCER  and 200-300% higher risk for HEART   ATTACK  &  STROKE.   .....................................Marland Kitchen It is also associated with higher death rate at younger ages,  autoimmune diseases like Rheumatoid arthritis, Lupus, Multiple Sclerosis.    Also many other serious conditions, like depression, Alzheimer's Dementia, infertility, muscle aches, fatigue, fibromyalgia - just to name a few. ++++++++++++++++++++ Recommend the book "The END of DIETING" by Dr Excell Seltzer  & the book "The END of DIABETES " by Dr Excell Seltzer At [email or web address] - get book & Audio CD's    Being diabetic has a  300% increased risk for heart attack, stroke, cancer, and alzheimer- type vascular dementia. It is very important that you work harder with diet by avoiding all foods that are white. Avoid white rice (brown & wild rice is OK), white potatoes (sweetpotatoes in moderation is OK), White bread or wheat bread or anything made out of white flour like bagels, donuts, rolls, buns, biscuits, cakes, pastries,  cookies, pizza crust, and pasta (made from white flour & egg whites) - vegetarian pasta or spinach or wheat pasta is OK. Multigrain breads like Arnold's or [employer], or multigrain sandwich thins or flatbreads.  Diet, exercise and weight loss can reverse and cure diabetes in the early stages.  Diet, exercise and weight loss is very important in the control and prevention of complications of diabetes which affects every system in your body, ie. Brain - dementia/stroke, eyes - glaucoma/blindness, heart - heart attack/heart failure, kidneys - dialysis, stomach - gastric paralysis, intestines - malabsorption, nerves - severe painful neuritis, circulation - gangrene & loss of a leg(s), and finally cancer and Alzheimers.    I recommend avoid fried & greasy foods,  sweets/candy, white rice (brown or wild rice or Quinoa is OK), white potatoes (sweet potatoes are OK) - anything made from white flour - bagels, doughnuts, rolls, buns, biscuits,white and wheat breads, pizza crust and traditional pasta made of white flour & egg white(vegetarian pasta or spinach or wheat pasta is OK).  Multi-grain bread is OK - like multi-grain flat bread or sandwich thins. Avoid alcohol in excess. Exercise is also important.    Eat all the vegetables you want - avoid meat, especially red meat and dairy - especially cheese.  Cheese is the most concentrated form of trans-fats which is the worst thing to clog up our arteries. Veggie cheese is OK which can be found in the fresh produce  section at [facility] or [employer] or [employer]  ++++++++++++++++++++ DASH Eating Plan  DASH stands for "Dietary Approaches to Stop Hypertension."   The DASH eating plan is a healthy eating plan that has been shown to reduce high blood pressure (hypertension). Additional health benefits may include reducing the risk of type 2 diabetes mellitus, heart disease, and stroke. The DASH eating plan may also help with weight loss. WHAT DO I NEED TO KNOW  ABOUT THE DASH EATING PLAN? For the DASH eating plan, you will follow these general guidelines:  Choose foods with a percent daily value for sodium of less than 5% (as listed on the food label).  Use salt-free seasonings or herbs instead of table salt or sea salt.  Check with your health care provider or pharmacist before using salt substitutes.  Eat lower-sodium products, often labeled as "lower sodium" or "no salt added."  Eat fresh foods.  Eat more vegetables, fruits, and low-fat dairy products.  Choose whole grains. Look for the word "whole" as the first word in the ingredient list.  Choose fish   Limit sweets, desserts, sugars, and sugary drinks.  Choose heart-healthy fats.  Eat veggie cheese   Eat more home-cooked food and less restaurant, buffet, and fast food.  Limit fried foods.  Cook foods using methods other than frying.  Limit canned vegetables. If you do use them, rinse them well to decrease the sodium.  When eating at a restaurant, ask that your food be prepared with less salt, or no salt if possible.                      WHAT FOODS CAN I EAT? Read Dr Fara Olden Fuhrman's books on The End of Dieting & The End of Diabetes  Grains Whole grain or whole wheat bread. Brown rice. Whole grain or whole wheat pasta. Quinoa, bulgur, and whole grain cereals. Low-sodium cereals. Corn or whole wheat flour tortillas. Whole grain cornbread. Whole grain crackers. Low-sodium crackers.  Vegetables Fresh or frozen vegetables (raw, steamed, roasted, or grilled). Low-sodium or reduced-sodium tomato and vegetable juices. Low-sodium or reduced-sodium tomato sauce and paste. Low-sodium or reduced-sodium canned vegetables.   Fruits All fresh, canned (in natural juice), or frozen fruits.  Protein Products  All fish and seafood.  Dried beans, peas, or lentils. Unsalted nuts and seeds. Unsalted canned beans.  Dairy Low-fat dairy products, such as skim or 1% milk, 2% or reduced-fat  cheeses, low-fat ricotta or cottage cheese, or plain low-fat yogurt. Low-sodium or reduced-sodium cheeses.  Fats and Oils Tub margarines without trans fats. Light or reduced-fat mayonnaise and salad dressings (reduced sodium). Avocado. Safflower, olive, or canola oils. Natural peanut or almond butter.  Other Unsalted popcorn and pretzels. The items listed above may not be a complete list of recommended foods or beverages. Contact your dietitian for more options.  +++++++++++++++++++  WHAT FOODS ARE NOT RECOMMENDED? Grains/ White flour or wheat flour White bread. White pasta. White rice. Refined cornbread. Bagels and croissants. Crackers that contain trans fat.  Vegetables  Creamed or fried vegetables. Vegetables in a . Regular canned vegetables. Regular canned tomato sauce and paste. Regular tomato and vegetable juices.  Fruits Dried fruits. Canned fruit in light or heavy syrup. Fruit juice.  Meat and Other Protein Products Meat in general - RED meat & White meat.  Fatty cuts of meat. Ribs, chicken wings, all processed meats as bacon, sausage, bologna, salami, fatback, hot dogs, bratwurst and packaged luncheon meats.  Dairy Whole  or 2% milk, cream, half-and-half, and cream cheese. Whole-fat or sweetened yogurt. Full-fat cheeses or blue cheese. Non-dairy creamers and whipped toppings. Processed cheese, cheese spreads, or cheese curds.  Condiments Onion and garlic salt, seasoned salt, table salt, and sea salt. Canned and packaged gravies. Worcestershire sauce. Tartar sauce. Barbecue sauce. Teriyaki sauce. Soy sauce, including reduced sodium. Steak sauce. Fish sauce. Oyster sauce. Cocktail sauce. Horseradish. Ketchup and mustard. Meat flavorings and tenderizers. Bouillon cubes. Hot sauce. Tabasco sauce. Marinades. Taco seasonings. Relishes.  Fats and Oils Butter, stick margarine, lard, shortening and bacon fat. Coconut, palm kernel, or palm oils. Regular salad dressings.  Pickles and  olives. Salted popcorn and pretzels.  The items listed above may not be a complete list of foods and beverages to avoid.

## 2017-06-01 ENCOUNTER — Other Ambulatory Visit: Payer: Self-pay | Admitting: Adult Health

## 2017-06-01 ENCOUNTER — Other Ambulatory Visit: Payer: Self-pay

## 2017-06-01 DIAGNOSIS — E781 Pure hyperglyceridemia: Secondary | ICD-10-CM

## 2017-06-01 LAB — BASIC METABOLIC PANEL WITH GFR
BUN: 13 mg/dL (ref 7–25)
CALCIUM: 9.5 mg/dL (ref 8.6–10.2)
CHLORIDE: 103 mmol/L (ref 98–110)
CO2: 24 mmol/L (ref 20–32)
Creat: 0.73 mg/dL (ref 0.50–1.10)
GFR, EST NON AFRICAN AMERICAN: 110 mL/min/{1.73_m2} (ref >=60)
GFR, Est African American: 127 mL/min/{1.73_m2} (ref >=60)
Glucose, Bld: 94 mg/dL (ref 65–99)
POTASSIUM: 4.2 mmol/L (ref 3.5–5.3)
SODIUM: 137 mmol/L (ref 135–146)

## 2017-06-01 LAB — CBC WITH DIFFERENTIAL/PLATELET
BASOS PCT: 0.8 %
Basophils Absolute: 41 cells/uL (ref 0–200)
EOS PCT: 2.6 %
Eosinophils Absolute: 133 cells/uL (ref 15–500)
HCT: 39.8 % (ref 35.0–45.0)
Hemoglobin: 13.6 g/dL (ref 11.7–15.5)
LYMPHS ABS: 1535 {cells}/uL (ref 850–3900)
MCH: 29.6 pg (ref 27.0–33.0)
MCHC: 34.2 g/dL (ref 32.0–36.0)
MCV: 86.7 fL (ref 80.0–100.0)
MPV: 9.9 fL (ref 7.5–12.5)
Monocytes Relative: 4.8 %
NEUTROS ABS: 3147 {cells}/uL (ref 1500–7800)
Neutrophils Relative %: 61.7 %
Platelets: 206 10*3/uL (ref 140–400)
RBC: 4.59 10*6/uL (ref 3.80–5.10)
RDW: 11.9 % (ref 11.0–15.0)
Total Lymphocyte: 30.1 %
WBC mixed population: 245 cells/uL (ref 200–950)
WBC: 5.1 10*3/uL (ref 3.8–10.8)

## 2017-06-01 LAB — HEMOGLOBIN A1C
Hgb A1c MFr Bld: 5.4 % of total Hgb (ref <5.7)
Mean Plasma Glucose: 108 (calc)
eAG (mmol/L): 6 (calc)

## 2017-06-01 LAB — VITAMIN D 25 HYDROXY (VIT D DEFICIENCY, FRACTURES): Vit D, 25-Hydroxy: 22 ng/mL — ABNORMAL LOW (ref 30–100)

## 2017-06-01 LAB — HEPATIC FUNCTION PANEL
AG Ratio: 1.4 (calc) (ref 1.0–2.5)
ALKALINE PHOSPHATASE (APISO): 58 U/L (ref 33–115)
ALT: 17 U/L (ref 6–29)
AST: 12 U/L (ref 10–30)
Albumin: 4.1 g/dL (ref 3.6–5.1)
BILIRUBIN DIRECT: 0.1 mg/dL (ref 0.0–0.2)
BILIRUBIN TOTAL: 0.3 mg/dL (ref 0.2–1.2)
Globulin: 2.9 g/dL (calc) (ref 1.9–3.7)
Indirect Bilirubin: 0.2 mg/dL (calc) (ref 0.2–1.2)
Total Protein: 7 g/dL (ref 6.1–8.1)

## 2017-06-01 LAB — IRON, TOTAL/TOTAL IRON BINDING CAP
%SAT: 19 % (calc) (ref 11–50)
Iron: 78 ug/dL (ref 40–190)
TIBC: 401 mcg/dL (calc) (ref 250–450)

## 2017-06-01 LAB — URINALYSIS W MICROSCOPIC + REFLEX CULTURE
BILIRUBIN URINE: NEGATIVE
Bacteria, UA: NONE SEEN /HPF
GLUCOSE, UA: NEGATIVE
HGB URINE DIPSTICK: NEGATIVE
Hyaline Cast: NONE SEEN /LPF
Ketones, ur: NEGATIVE
Leukocyte Esterase: NEGATIVE
NITRITES URINE, INITIAL: NEGATIVE
PROTEIN: NEGATIVE
RBC / HPF: NONE SEEN /HPF (ref 0–2)
Specific Gravity, Urine: 1.008 (ref 1.001–1.03)
WBC UA: NONE SEEN /HPF (ref 0–5)
pH: 5.5 (ref 5.0–8.0)

## 2017-06-01 LAB — NO CULTURE INDICATED

## 2017-06-01 LAB — LIPID PANEL
CHOLESTEROL: 219 mg/dL — AB (ref <200)
HDL: 44 mg/dL — ABNORMAL LOW (ref >50)
Non-HDL Cholesterol (Calc): 175 mg/dL (calc) — ABNORMAL HIGH (ref <130)
TRIGLYCERIDES: 476 mg/dL — AB (ref <150)
Total CHOL/HDL Ratio: 5 (calc) — ABNORMAL HIGH (ref <5.0)

## 2017-06-01 LAB — VITAMIN B12: VITAMIN B 12: 367 pg/mL (ref 200–1100)

## 2017-06-01 LAB — TSH: TSH: 1.66 mIU/L

## 2017-06-01 MED ORDER — FENOFIBRATE 120 MG PO TABS: 120.0000 mg | ORAL_TABLET | Freq: Every day | ORAL | 1 refills | Status: DC

## 2017-06-11 MED FILL — TRETINOIN 0.05% CREAM: 0.05 | 20 days supply | Qty: 45 | Fill #1

## 2017-06-11 MED FILL — ACZONE 7.5% GEL PUMP: 7.5 | 60 days supply | Qty: 60 | Fill #1

## 2017-06-11 MED FILL — SPIRONOLACTONE 100 MG TAB: 100 | 30 days supply | Qty: 30 | Fill #1

## 2017-06-20 MED FILL — AMPHETAMINE-DEXTRO 20MG: 20 | 30 days supply | Qty: 40 | Fill #0

## 2017-06-25 DIAGNOSIS — H52223 Regular astigmatism, bilateral: Secondary | ICD-10-CM | POA: Diagnosis not present

## 2017-07-06 MED FILL — NORG-EE 0.18-0.215-0.25/0.0: 0.18/0.215/ | 84 days supply | Qty: 84 | Fill #2

## 2017-07-06 MED FILL — FENOFIBRATE 120 MG TAB: 120 | 90 days supply | Qty: 90 | Fill #0

## 2017-08-14 ENCOUNTER — Other Ambulatory Visit: Payer: Self-pay

## 2017-08-14 ENCOUNTER — Other Ambulatory Visit: Payer: Self-pay | Admitting: Adult Health

## 2017-08-14 DIAGNOSIS — F988 Other specified behavioral and emotional disorders with onset usually occurring in childhood and adolescence: Secondary | ICD-10-CM

## 2017-08-14 MED ORDER — AMPHETAMINE-DEXTROAMPHETAMINE 20 MG PO TABS: ORAL_TABLET | ORAL | 0 refills | Status: DC

## 2017-08-14 MED ORDER — AMPHETAMINE-DEXTROAMPHETAMINE 20 MG PO TABS
ORAL_TABLET | ORAL | 0 refills | Status: DC
Start: 2017-08-14 — End: 2017-08-14

## 2017-08-14 MED FILL — AMPHETAMINE-DEXTRO 20MG: 20 | 28 days supply | Qty: 40 | Fill #0

## 2017-08-14 NOTE — Telephone Encounter (Signed)
Refill request for Adderall. Last office visit on 05/31/17, next office visit on 11/29/17. In que for your review.

## 2017-08-21 MED FILL — TRIAMCINOLONE 0.1% OINTMENT: 0.1 | 30 days supply | Qty: 120 | Fill #0

## 2017-09-07 DIAGNOSIS — H00022 Hordeolum internum right lower eyelid: Secondary | ICD-10-CM | POA: Diagnosis not present

## 2017-09-07 MED FILL — DOXYCYCLINE HYCLATE 100 MG: 100 | 11 days supply | Qty: 12 | Fill #0

## 2017-10-01 MED FILL — TRI FEMYNOR 28 TABLET: 0.18/0.215/ | 84 days supply | Qty: 84 | Fill #3

## 2017-10-03 ENCOUNTER — Other Ambulatory Visit: Payer: Self-pay

## 2017-10-03 DIAGNOSIS — F988 Other specified behavioral and emotional disorders with onset usually occurring in childhood and adolescence: Secondary | ICD-10-CM

## 2017-10-03 MED ORDER — AMPHETAMINE-DEXTROAMPHETAMINE 20 MG PO TABS: ORAL_TABLET | ORAL | 0 refills | Status: DC

## 2017-10-03 MED FILL — DEXTROAMP-AMPHETAMIN 20 MG: 20 | 28 days supply | Qty: 40 | Fill #0

## 2017-10-03 NOTE — Telephone Encounter (Signed)
Refill request for Adderall

## 2017-10-08 NOTE — Progress Notes (Signed)
Assessment and Plan:  Tonya [name] was seen today for mass.  Diagnoses and all orders for this visit:  Blister of axilla with infection, right, initial encounter Culture pending, proceed with MRSA coverage/previously sensitive abx Discussed applying hot compresses  Follow up in 2 weeks if not resolving, or sooner with progressive symptoms or concerns -     sulfamethoxazole-trimethoprim (BACTRIM DS,SEPTRA DS) 800-160 MG tablet; Take 1 tablet by mouth 2 (two) times daily. -     WOUND CULTURE  Axillary lump, right Abcess vs cyst vs lymph node - patient very anxious regarding breast cancer, though is agreeable to evaluating progress on abx, she will call office if no changes in 5-7 days and we can order an [place] to investigate further.  -     sulfamethoxazole-trimethoprim (BACTRIM DS,SEPTRA DS) 800-160 MG tablet; Take 1 tablet by mouth 2 (two) times daily.  Further disposition pending results of labs. Discussed med's effects and SE's.   Over 30 minutes of exam, counseling, chart review, and critical decision making was performed.   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 She reports area has been stable, no discharge, non-tender. Superficial area non-injected, no point visible. She found this performing regular self breast exam.   She also has of red rash with 5-6 distinct raised lesions, 1 single large pustule  immediately below area of lower axillary lump. She reports these appeared last week. She reports hx of similar in 2017 that was +MRSA, culture indicated sensitive to septra and rifampin at that time and she recovered well with dual treatment.  Area surrounding pustule cleaned with alcohol, surface lanced with 11 blade and culture of purulent discharge was obtained and sent for evaluation.   No personal or family hx of breast cancer.   Past Medical History:  Diagnosis Date  . ADD (attention deficit disorder)   . Anxiety   . Gestational diabetes   . Hyperlipidemia      No Known Allergies  Current Outpatient Medications on File Prior to Visit  Medication Sig  . amphetamine-dextroamphetamine (ADDERALL) 20 MG tablet Take 1 to 2 tablets daily, not to exceed 5 days a week.  . Dapsone (ACZONE) 7.5 % GEL Apply topically. Daily at bedtime  . Fenofibrate 120 MG TABS Take 1 tablet (120 mg total) by mouth daily.  . norethindrone-ethinyl estradiol (CYCLAFEM,ALYACEN) 0.5/0.75/1-35 MG-MCG tablet Take 1 tablet by mouth daily.  Marland Kitchen SPIRONOLACTONE PO Take 100 mg by mouth.   . tretinoin (RETIN-A) 0.025 % cream Apply topically at bedtime.   No current facility-administered medications on file prior to visit.     ROS: Review of Systems  Constitutional: Negative for chills, diaphoresis, fever, malaise/fatigue and weight loss.  Eyes: Negative for blurred vision and double vision.  Respiratory: Negative for cough, shortness of breath and wheezing.   Cardiovascular: Negative for chest pain, palpitations, orthopnea, claudication, leg swelling and PND.  Skin: Positive for rash.  Neurological:  Negative for dizziness and headaches.  Psychiatric/Behavioral: The patient is nervous/anxious.     Physical Exam:  BP 116/80   Pulse (!) 125   Temp 97.7 F (36.5 C)   Ht 5\' 5"  (1.651 m)   Wt 189 lb (85.7 kg)   SpO2 98%   BMI 31.45 kg/m   General Appearance: Well nourished, in no apparent distress. Eyes:  PERRL, conjunctiva no swelling or erythema Neck: Supple, thyroid normal.  Respiratory: Respiratory effort normal, BS equal bilaterally without rales, rhonchi, wheezing or stridor.  Cardio: RRR with no MRGs. Brisk peripheral pulses without edema.  Skin: Single palpable lump to right lower axilla; firm, smooth, non-fixed, nontender, without superficial injection/point/heat. Area below palpable lump with area of erythematous base with 5-6 distinct raised bumps, and 1 single pustule.  Psych: Awake and oriented X 3, anxious affect, Insight and Judgment appropriate.    [name], NP 4:01 PM [facility]

## 2017-10-09 ENCOUNTER — Ambulatory Visit (INDEPENDENT_AMBULATORY_CARE_PROVIDER_SITE_OTHER): Payer: 59 | Admitting: Adult Health

## 2017-10-09 ENCOUNTER — Encounter: Payer: Self-pay | Admitting: Adult Health

## 2017-10-09 VITALS — BP 116/80 | HR 125 | Temp 97.7°F | Ht 65.0 in | Wt 189.0 lb

## 2017-10-09 DIAGNOSIS — S40821A Blister (nonthermal) of right upper arm, initial encounter: Secondary | ICD-10-CM

## 2017-10-09 DIAGNOSIS — R2231 Localized swelling, mass and lump, right upper limb: Secondary | ICD-10-CM

## 2017-10-09 DIAGNOSIS — L089 Local infection of the skin and subcutaneous tissue, unspecified: Secondary | ICD-10-CM

## 2017-10-09 MED ORDER — SULFAMETHOXAZOLE-TRIMETHOPRIM 800-160 MG PO TABS: 1.0000 | ORAL_TABLET | Freq: Two times a day (BID) | ORAL | 0 refills | Status: DC

## 2017-10-09 MED FILL — SULFAMETHOXAZOLE-TMP DS TAB: 800-160 | 7 days supply | Qty: 14 | Fill #0

## 2017-10-09 NOTE — Patient Instructions (Signed)
MRSA Infection, Adult MRSA stands for methicillin-resistant Staphylococcus aureus. This type of infection is caused by Staphylococcus aureus bacteria that are no longer affected by the medicines used to kill them (drug resistant). Staphylococcus (staph) bacteria are normally found on the skin or in the nose of healthy people. In most cases, these bacteria do not cause infection. But if these resistant bacteria enter your body through a cut or sore, they can cause a serious infection on your skin or in other parts of your body. There is a slight chance that the staph on your skin or in your nose is MRSA. There are two types of MRSA infections:  Hospital-acquired MRSA is bacteria that you get in the hospital.  Community-acquired MRSA is bacteria that you get somewhere other than in a hospital.  What increases the risk? Hospital-acquired MRSA is more common. You could be at risk for this infection if you are in the hospital and you:  Have surgery or a procedure.  Have an IV access or a catheter tube placed in your body.  Have weak resistance to germs (weakened immune system).  Are elderly.  Are on kidney dialysis.  You could be at risk for community-acquired MRSA if you have a break in your skin and come into contact with MRSA. This may happen if you:  Play sports where there is skin-to-skin contact.  Live in a crowded setting, like a dormitory or a military barracks.  Share towels, razors, or sports equipment with other people.  What are the signs or symptoms? Symptoms of hospital-acquired MRSA depend on where MRSA has spread. Symptoms may include:  Wound infection.  Skin infection.  Rash.  Pneumonia.  Fever and chills.  Difficulty breathing.  Chest pain.  Community-acquired MRSA is most likely to start as a scratch or cut that becomes infected. Symptoms may include:  A pus-filled pimple.  A boil on your skin.  Pus draining from your skin.  A sore (abscess) under  your skin or somewhere in your body.  Fever with or without chills.  How is this diagnosed? The diagnosis of MRSA is made by taking a sample from an infected area and sending it to a lab for testing. A lab technician can grow (culture) MRSA and check it under a microscope. The cultured MRSA can be tested to see which type of antibiotic medicine will work to treat it. Newer tests can identify MRSA more quickly by testing bacteria samples for MRSA genes. Your health care provider can diagnose MRSA using samples from:  Cuts or wounds in infected areas.  Nasal swabs.  Saliva or cough specimens from deep in the lungs (sputum).  Urine.  Blood.  You may also have:  Imaging studies (such as X-ray or MRI) to check if the infection has spread to the lungs, bones, or joints.  A culture and sensitivity test of blood or fluids from inside the joints.  How is this treated? Treatment depends on how severe, deep, or extensive the infection is. Very bad infections may require a hospital stay.  Some skin infections, such as a small boil or sore (abscess), may be treated by draining pus from the site of the infection.  More extensive surgery to drain pus may be necessary for deeper or more widespread soft tissue infections.  You may then have to take antibiotic medicine given by mouth or through a vein. You may start antibiotic treatment right away or after testing can be done to see what antibiotic medicine should be   used.  Follow these instructions at home:  Take your antibiotics as directed by your health care provider. Take the medicine as prescribed until it is finished.  Avoid close contact with those around you as much as possible. Do not use towels, razors, toothbrushes, bedding, or other items that will be used by others.  Wash your hands frequently for 15 seconds with soap and water. Dry your hands with a clean or disposable towel.  When you are not able to wash your hands, use hand  sanitizer that is more than 60 percent alcohol.  Wash towels, sheets, or clothes in the washing machine with detergent and hot water. Dry them in a hot dryer.  Follow your health care provider's instructions for wound care. Wash your hands before and after changing your bandages.  Always shower after exercising.  Keep all cuts and scrapes clean and covered with a bandage.  Be sure to tell all your health care providers that you have MRSA so they are aware of your infection. Contact a health care provider if:  You have a cut, scrape, pimple, or boil that becomes red, swollen, or painful or has pus in it.  You have pus draining from your skin.  You have an abscess under your skin or somewhere in your body. Get help right away if:  You have symptoms of a skin infection with a fever or chills.  You have trouble breathing.  You have chest pain.  You have a skin wound and you become nauseous or start vomiting. This information is not intended to replace advice given to you by your health care provider. Make sure you discuss any questions you have with your health care provider. Document Released: 06/12/2005 Document Revised: 11/18/2015 Document Reviewed: 04/04/2013 Elsevier Interactive Patient Education  2017 [employer] Skin Abscess A skin abscess is an infected area on or under your skin that contains a collection of pus and other material. An abscess may also be called a furuncle, carbuncle, or boil. An abscess can occur in or on almost any part of your body. Some abscesses break open (rupture) on their own. Most continue to get worse unless they are treated. The infection can spread deeper into the body and eventually into your blood, which can make you feel ill. Treatment usually involves draining the abscess. What are the causes? An abscess occurs when germs, often bacteria, pass through your skin and cause an infection. This may be caused by:  A scrape or cut on your  skin.  A puncture wound through your skin, including a needle injection.  Blocked oil or sweat glands.  Blocked and infected hair follicles.  A cyst that forms beneath your skin (sebaceous cyst) and becomes infected.  What increases the risk? This condition is more likely to develop in people who:  Have a weak body defense system (immune system).  Have diabetes.  Have dry and irritated skin.  Get frequent injections or use illegal IV drugs.  Have a foreign body in a wound, such as a splinter.  Have problems with their lymph system or veins.  What are the signs or symptoms? An abscess may start as a painful, firm bump under the skin. Over time, the abscess may get larger or become softer. Pus may appear at the top of the abscess, causing pressure and pain. It may eventually break through the skin and drain. Other symptoms include:  Redness.  Warmth.  Swelling.  Tenderness.  A sore on the skin.  How  is this diagnosed? This condition is diagnosed based on your medical history and a physical exam. A sample of pus may be taken from the abscess to find out what is causing the infection and what antibiotics can be used to treat it. You also may have:  Blood tests to look for signs of infection or spread of an infection to your blood.  Imaging studies such as ultrasound, CT scan, or MRI if the abscess is deep.  How is this treated? Small abscesses that drain on their own may not need treatment. Treatment for an abscess that does not rupture on its own may include:  Warm compresses applied to the area several times per day.  Incision and drainage. Your health care provider will make an incision to open the abscess and will remove pus and any foreign body or dead tissue. The incision area may be packed with gauze to keep it open for a few days while it heals.  Antibiotic medicines to treat infection. For a severe abscess, you may first get antibiotics through an IV and then  change to oral antibiotics.  Follow these instructions at home: Abscess Care  If you have an abscess that has not drained, place a warm, clean, wet washcloth over the abscess several times a day. Do this as told by your health care provider.  Follow instructions from your health care provider about how to take care of your abscess. Make sure you: ? Cover the abscess with a bandage (dressing). ? Change your dressing or gauze as told by your health care provider. ? Wash your hands with soap and water before you change the dressing or gauze. If soap and water are not available, use hand sanitizer.  Check your abscess every day for signs of a worsening infection. Check for: ? More redness, swelling, or pain. ? More fluid or blood. ? Warmth. ? More pus or a bad smell. Medicines  Take over-the-counter and prescription medicines only as told by your health care provider.  If you were prescribed an antibiotic medicine, take it as told by your health care provider. Do not stop taking the antibiotic even if you start to feel better. General instructions  To avoid spreading the infection: ? Do not share personal care items, towels, or hot tubs with others. ? Avoid making skin contact with other people.  Keep all follow-up visits as told by your health care provider. This is important. Contact a health care provider if:  You have more redness, swelling, or pain around your abscess.  You have more fluid or blood coming from your abscess.  Your abscess feels warm to the touch.  You have more pus or a bad smell coming from your abscess.  You have a fever.  You have muscle aches.  You have chills or a general ill feeling. Get help right away if:  You have severe pain.  You see red streaks on your skin spreading away from the abscess. This information is not intended to replace advice given to you by your health care provider. Make sure you discuss any questions you have with your  health care provider. Document Released: 03/22/2005 Document Revised: 02/06/2016 Document Reviewed: 04/21/2015 Elsevier Interactive Patient Education  [employer].

## 2017-10-11 ENCOUNTER — Other Ambulatory Visit: Payer: Self-pay | Admitting: Adult Health

## 2017-10-11 DIAGNOSIS — R2231 Localized swelling, mass and lump, right upper limb: Secondary | ICD-10-CM

## 2017-10-11 NOTE — Addendum Note (Signed)
Addended by: [name] on: 10/11/2017 01:51 PM   Modules accepted: Orders

## 2017-10-12 LAB — WOUND CULTURE
MICRO NUMBER:: 90471322
SPECIMEN QUALITY: ADEQUATE

## 2017-10-16 ENCOUNTER — Ambulatory Visit
Admission: RE | Admit: 2017-10-16 | Discharge: 2017-10-16 | Disposition: A | Discharge status: Home / Self Care | Payer: 59 | Source: Ambulatory Visit | Attending: Adult Health | Admitting: Adult Health

## 2017-10-16 DIAGNOSIS — R922 Inconclusive mammogram: Secondary | ICD-10-CM | POA: Diagnosis not present

## 2017-10-16 DIAGNOSIS — R2231 Localized swelling, mass and lump, right upper limb: Secondary | ICD-10-CM

## 2017-10-16 DIAGNOSIS — N6489 Other specified disorders of breast: Secondary | ICD-10-CM | POA: Diagnosis not present

## 2017-11-13 MED FILL — FENOFIBRATE 120 MG TAB: 120 | 90 days supply | Qty: 90 | Fill #1

## 2017-11-29 ENCOUNTER — Ambulatory Visit: Payer: Self-pay | Admitting: Adult Health

## 2017-12-05 ENCOUNTER — Other Ambulatory Visit: Payer: Self-pay

## 2017-12-05 DIAGNOSIS — F988 Other specified behavioral and emotional disorders with onset usually occurring in childhood and adolescence: Secondary | ICD-10-CM

## 2017-12-05 NOTE — Telephone Encounter (Signed)
Refill request for Adderall. PMP checked, last filled on 10/03/17. Next office visit on 12/19/17.

## 2017-12-06 MED ORDER — AMPHETAMINE-DEXTROAMPHETAMINE 20 MG PO TABS: ORAL_TABLET | ORAL | 0 refills | Status: DC

## 2017-12-07 ENCOUNTER — Encounter: Payer: Self-pay | Admitting: Adult Health

## 2017-12-07 MED FILL — DEXTROAMP-AMPHETAMIN 20 MG: 20 | 30 days supply | Qty: 40 | Fill #0

## 2017-12-09 ENCOUNTER — Other Ambulatory Visit: Payer: Self-pay | Admitting: Adult Health

## 2017-12-09 MED ORDER — DOXYCYCLINE HYCLATE 100 MG PO TABS: 100.0000 mg | ORAL_TABLET | Freq: Two times a day (BID) | ORAL | 0 refills | Status: AC

## 2017-12-19 ENCOUNTER — Ambulatory Visit: Payer: Self-pay | Admitting: Adult Health

## 2017-12-24 MED FILL — TRI FEMYNOR 28 TABLET: 0.18/0.215/ | 84 days supply | Qty: 84 | Fill #0

## 2018-01-02 DIAGNOSIS — E669 Obesity, unspecified: Secondary | ICD-10-CM | POA: Insufficient documentation

## 2018-01-02 NOTE — Progress Notes (Signed)
FOLLOW UP  Assessment and Plan:   ADD Continue medications: adderall 20 mg PRN Helps with focus, no AE's. The patient was counseled on the addictive nature of the medication and was encouraged to take drug holidays when not needed.   Cholesterol Currently above goal; on fenofibrate for triglycerides, admits to irregularly taking due to 3rd shift scheduling Continue low cholesterol diet and exercise.  Check lipid panel.   Obesity with co morbidities Long discussion about weight loss, diet, and exercise Recommended diet heavy in fruits and veggies and low in animal meats, cheeses, and dairy products, appropriate calorie intake Discussed ideal weight for height Patient will work on increasing water intake, portions Will follow up in 3 months   Continue diet and meds as discussed. Further disposition pending results of labs. Discussed med's effects and SE's.   Over 30 minutes of exam, counseling, chart review, and critical decision making was performed.   Future Appointments  Date Time Provider [place]  06/03/2018  2:00 PM [name], NP [facility] None    ----------------------------------------------------------------------------------------------------------------------  HPI BP 120/82   Pulse 89   Temp (!) 97.5 F (36.4 C)   Ht 5\' 5"  (1.651 m)   Wt 191 lb (86.6 kg)   SpO2 98%   BMI 31.78 kg/m   32 y.o. female , works as Product manager, presents for 6 month follow up on cholesterol, weight and ADD.   Patient is on an ADD medication (adderall 20 mg BID PRN), she states that the medication is helping and she denies any adverse reactions. She is currently taking on days that she works, taking 4-5 days/week.   BMI is Body mass index is 31.78 kg/m., she has been working on diet and exercise, walking 3 times a week. Plans to work on cooking at home more. She does admit to poor water intake.  Wt Readings from Last 3 Encounters:  01/03/18 191 lb (86.6 kg)   10/09/17 189 lb (85.7 kg)  05/31/17 188 lb (85.3 kg)   Today their BP is BP: 120/82  She does workout. She denies chest pain, shortness of breath, dizziness.   She is on cholesterol medication and denies myalgias. Her cholesterol is not at goal. The cholesterol last visit was:   Lab Results  Component Value Date   CHOL 219 (H) 05/31/2017   HDL 44 (L) 05/31/2017   Brooks  05/31/2017     Comment:     . LDL cholesterol not calculated. Triglyceride levels greater than 400 mg/dL invalidate calculated LDL results. . Reference range: <100 . Desirable range <100 mg/dL for primary prevention;   <70 mg/dL for patients with CHD or diabetic patients  with > or = 2 CHD risk factors. Marland Kitchen LDL-C is now calculated using the Martin-Hopkins  calculation, which is a validated novel method providing  better accuracy than the Friedewald equation in the  estimation of LDL-C.  Cresenciano Genre et al. Annamaria Helling. 3267;124(58): 2061-2068  (http://education.[facility].com/faq/FAQ164)    TRIG 476 (H) 05/31/2017   CHOLHDL 5.0 (H) 05/31/2017     Current Medications:  Current Outpatient Medications on File Prior to Visit  Medication Sig  . Dapsone (ACZONE) 7.5 % GEL Apply topically. Daily at bedtime  . Fenofibrate 120 MG TABS Take 1 tablet (120 mg total) by mouth daily.  . norethindrone-ethinyl estradiol (CYCLAFEM,ALYACEN) 0.5/0.75/1-35 MG-MCG tablet Take 1 tablet by mouth daily.  Marland Kitchen SPIRONOLACTONE PO Take 100 mg by mouth.   . sulfamethoxazole-trimethoprim (BACTRIM DS,SEPTRA DS) 800-160 MG tablet Take 1 tablet by  mouth 2 (two) times daily.  Marland Kitchen tretinoin (RETIN-A) 0.025 % cream Apply topically at bedtime.   No current facility-administered medications on file prior to visit.      Allergies: No Known Allergies   Medical History:  Past Medical History:  Diagnosis Date  . ADD (attention deficit disorder)   . Anxiety   . Gestational diabetes   . Hyperlipidemia    Family history- Reviewed and  unchanged Social history- Reviewed and unchanged   Review of Systems:  Review of Systems  Constitutional: Negative for malaise/fatigue and weight loss.  HENT: Negative for hearing loss and tinnitus.   Eyes: Negative for blurred vision and double vision.  Respiratory: Negative for cough, shortness of breath and wheezing.   Cardiovascular: Negative for chest pain, palpitations, orthopnea, claudication and leg swelling.  Gastrointestinal: Negative for abdominal pain, blood in stool, constipation, diarrhea, heartburn, melena, nausea and vomiting.  Genitourinary: Negative.   Musculoskeletal: Negative for joint pain and myalgias.  Skin: Negative for rash.  Neurological: Negative for dizziness, tingling, sensory change, weakness and headaches.  Endo/Heme/Allergies: Negative for polydipsia.  Psychiatric/Behavioral: Negative.   All other systems reviewed and are negative.     Physical Exam: BP 120/82   Pulse 89   Temp (!) 97.5 F (36.4 C)   Ht 5\' 5"  (1.651 m)   Wt 191 lb (86.6 kg)   SpO2 98%   BMI 31.78 kg/m  Wt Readings from Last 3 Encounters:  01/03/18 191 lb (86.6 kg)  10/09/17 189 lb (85.7 kg)  05/31/17 188 lb (85.3 kg)   General Appearance: Well nourished, in no apparent distress. Eyes: PERRLA, EOMs, conjunctiva no swelling or erythema Sinuses: No Frontal/maxillary tenderness ENT/Mouth: Ext aud canals clear, TMs without erythema, bulging. No erythema, swelling, or exudate on post pharynx.  Tonsils not swollen or erythematous. Hearing normal.  Neck: Supple, thyroid normal.  Respiratory: Respiratory effort normal, BS equal bilaterally without rales, rhonchi, wheezing or stridor.  Cardio: RRR with no MRGs. Brisk peripheral pulses without edema.  Abdomen: Soft, + BS.  Non tender, no guarding, rebound, hernias, masses. Lymphatics: Non tender without lymphadenopathy.  Musculoskeletal: Full ROM, 5/5 strength, Normal gait Skin: Warm, dry without rashes, lesions, ecchymosis.   Neuro: Cranial nerves intact. No cerebellar symptoms.  Psych: Awake and oriented X 3, normal affect, Insight and Judgment appropriate.    [name], NP 4:27 PM [facility]

## 2018-01-03 ENCOUNTER — Ambulatory Visit (INDEPENDENT_AMBULATORY_CARE_PROVIDER_SITE_OTHER): Payer: 59 | Admitting: Adult Health

## 2018-01-03 ENCOUNTER — Encounter: Payer: Self-pay | Admitting: Adult Health

## 2018-01-03 VITALS — BP 120/82 | HR 89 | Temp 97.5°F | Ht 65.0 in | Wt 191.0 lb

## 2018-01-03 DIAGNOSIS — E782 Mixed hyperlipidemia: Secondary | ICD-10-CM | POA: Diagnosis not present

## 2018-01-03 DIAGNOSIS — Z79899 Other long term (current) drug therapy: Secondary | ICD-10-CM | POA: Diagnosis not present

## 2018-01-03 DIAGNOSIS — F988 Other specified behavioral and emotional disorders with onset usually occurring in childhood and adolescence: Secondary | ICD-10-CM | POA: Diagnosis not present

## 2018-01-03 DIAGNOSIS — E66811 Obesity, class 1: Secondary | ICD-10-CM

## 2018-01-03 DIAGNOSIS — E669 Obesity, unspecified: Secondary | ICD-10-CM | POA: Diagnosis not present

## 2018-01-03 MED ORDER — AMPHETAMINE-DEXTROAMPHETAMINE 20 MG PO TABS: ORAL_TABLET | ORAL | 0 refills | Status: DC

## 2018-01-03 NOTE — Patient Instructions (Signed)
Aim for 7+ servings of fruits and vegetables daily  65+ fluid ounces of water or unsweet tea for healthy kidneys  Limit alcohol to 1 drink per sitting, try to keep to <4/week  Limit animal fats in diet for cholesterol and heart health - choose grass fed whenever available  Aim for low stress - take time to unwind and care for your mental health  Aim for 150 min of moderate intensity exercise weekly for heart health, and weights twice weekly for bone health  Aim for 7-9 hours of sleep daily   Easy weight loss tips-    Drink 1/2 your body weight in fluid ounces of water daily; drink a tall glass of water 30 min before meals  Don't eat until you're stuffed- listen to your stomach and eat until you are 80% full   Try eating off of a salad plate; wait 10 min after finishing before going back for seconds  Start by eating the vegetables on your plate; aim for 50% of your meals to be fruits or vegetables  Then eat your protein - lean meats (grass fed if possible), fish, beans, nuts in moderation  Eat your carbs/starch last ONLY if you still are hungry. If you can, stop before finishing it all  Avoid sugar and flour - the closer it looks to it's original form in nature, typically the better it is for you  Splurge in moderation - "assign" days when you get to splurge and have the "bad stuff" - I like to follow a 80% - 20% plan- "good" choices 80 % of the time, "bad" choices in moderation 20% of the time  Simple equation is: Calories out > calories in = weight loss - even if you eat the bad stuff, if you limit portions, you will still lose weight       When it comes to diets, agreement about the perfect plan isn't easy to find, even among the experts. Experts at the [employer] developed an idea known as the Healthy Eating Plate. Just imagine a plate divided into logical, healthy portions.  The emphasis is on diet quality:  Load up on vegetables and fruits -  one-half of your plate: Aim for color and variety, and remember that potatoes don't count.  Go for whole grains - one-quarter of your plate: Whole wheat, barley, wheat berries, quinoa, oats, brown rice, and foods made with them. If you want pasta, go with whole wheat pasta.  Protein power - one-quarter of your plate: Fish, chicken, beans, and nuts are all healthy, versatile protein sources. Limit red meat.  The diet, however, does go beyond the plate, offering a few other suggestions.  Use healthy plant oils, such as olive, canola, soy, corn, sunflower and peanut. Check the labels, and avoid partially hydrogenated oil, which have unhealthy trans fats.  If you're thirsty, drink water. Coffee and tea are good in moderation, but skip sugary drinks and limit milk and dairy products to one or two daily servings.  The type of carbohydrate in the diet is more important than the amount. Some sources of carbohydrates, such as vegetables, fruits, whole grains, and beans-are healthier than others.  Finally, stay active.

## 2018-01-04 ENCOUNTER — Other Ambulatory Visit: Payer: Self-pay | Admitting: Adult Health

## 2018-01-04 ENCOUNTER — Encounter: Payer: Self-pay | Admitting: Adult Health

## 2018-01-04 DIAGNOSIS — E781 Pure hyperglyceridemia: Secondary | ICD-10-CM

## 2018-01-04 LAB — COMPLETE METABOLIC PANEL WITH GFR
AG Ratio: 1.5 (calc) (ref 1.0–2.5)
ALKALINE PHOSPHATASE (APISO): 50 U/L (ref 33–115)
ALT: 37 U/L — ABNORMAL HIGH (ref 6–29)
AST: 19 U/L (ref 10–30)
Albumin: 4.2 g/dL (ref 3.6–5.1)
BILIRUBIN TOTAL: 0.3 mg/dL (ref 0.2–1.2)
BUN: 18 mg/dL (ref 7–25)
CHLORIDE: 101 mmol/L (ref 98–110)
CO2: 24 mmol/L (ref 20–32)
CREATININE: 0.7 mg/dL (ref 0.50–1.10)
Calcium: 9.7 mg/dL (ref 8.6–10.2)
GFR, Est African American: 134 mL/min/{1.73_m2} (ref >=60)
GFR, Est Non African American: 115 mL/min/{1.73_m2} (ref >=60)
GLUCOSE: 85 mg/dL (ref 65–99)
Globulin: 2.8 g/dL (calc) (ref 1.9–3.7)
Potassium: 3.9 mmol/L (ref 3.5–5.3)
Sodium: 135 mmol/L (ref 135–146)
Total Protein: 7 g/dL (ref 6.1–8.1)

## 2018-01-04 LAB — LIPID PANEL
CHOLESTEROL: 205 mg/dL — AB (ref <200)
HDL: 40 mg/dL — AB (ref >50)
NON-HDL CHOLESTEROL (CALC): 165 mg/dL — AB (ref <130)
Total CHOL/HDL Ratio: 5.1 (calc) — ABNORMAL HIGH (ref <5.0)
Triglycerides: 590 mg/dL — ABNORMAL HIGH (ref <150)

## 2018-01-04 MED ORDER — FENOFIBRATE 160 MG PO TABS: 160.0000 mg | ORAL_TABLET | Freq: Every day | ORAL | 1 refills | Status: DC

## 2018-01-04 MED FILL — DEXTROAMP-AMPHETAMIN 20 MG: 20 | 28 days supply | Qty: 40 | Fill #0

## 2018-01-04 MED FILL — FENOFIBRATE 160 MG TABLET: 160 | 90 days supply | Qty: 90 | Fill #0

## 2018-01-06 ENCOUNTER — Other Ambulatory Visit: Payer: Self-pay | Admitting: Adult Health

## 2018-01-06 MED ORDER — CHOLECALCIFEROL 125 MCG (5000 UT) PO CAPS: 5000.0000 [IU] | ORAL_CAPSULE | Freq: Every day | ORAL | 1 refills | Status: DC

## 2018-01-06 MED ORDER — OMEGA 3 1000 MG PO CAPS: 1.0000 | ORAL_CAPSULE | Freq: Every day | ORAL | 1 refills | Status: DC

## 2018-01-07 MED FILL — OMEGA-3 1000 MG CAPS: 1000 | 60 days supply | Qty: 60 | Fill #0

## 2018-01-07 MED FILL — VITAMIN D3 5,000 UNIT CAP: 125 MCG | 100 days supply | Qty: 100 | Fill #0

## 2018-02-07 ENCOUNTER — Other Ambulatory Visit: Payer: Self-pay

## 2018-02-07 DIAGNOSIS — F988 Other specified behavioral and emotional disorders with onset usually occurring in childhood and adolescence: Secondary | ICD-10-CM

## 2018-02-07 MED ORDER — AMPHETAMINE-DEXTROAMPHETAMINE 20 MG PO TABS: ORAL_TABLET | ORAL | 0 refills | Status: DC

## 2018-02-07 MED FILL — DEXTROAMP-AMPHETAMIN 20 MG: 20 | 28 days supply | Qty: 40 | Fill #0

## 2018-02-07 NOTE — Telephone Encounter (Signed)
Adderall refill request. PMP checked, last filled on 01/03/18, #40, 28 day supply. Last office visit on 01/03/18, next office visit on 06/03/18

## 2018-02-12 ENCOUNTER — Encounter: Payer: Self-pay | Admitting: Adult Health

## 2018-02-12 ENCOUNTER — Ambulatory Visit (INDEPENDENT_AMBULATORY_CARE_PROVIDER_SITE_OTHER): Payer: 59 | Admitting: Adult Health

## 2018-02-12 VITALS — BP 128/84 | HR 99 | Temp 97.0°F | Ht 65.0 in | Wt 183.0 lb

## 2018-02-12 DIAGNOSIS — J060 Acute laryngopharyngitis: Secondary | ICD-10-CM | POA: Diagnosis not present

## 2018-02-12 MED ORDER — PROMETHAZINE-DM 6.25-15 MG/5ML PO SYRP: 5.0000 mL | ORAL_SOLUTION | Freq: Four times a day (QID) | ORAL | 1 refills | Status: DC | PRN

## 2018-02-12 MED ORDER — AZITHROMYCIN 250 MG PO TABS: ORAL_TABLET | ORAL | 1 refills | Status: AC

## 2018-02-12 MED ORDER — PREDNISONE 20 MG PO TABS: ORAL_TABLET | ORAL | 0 refills | Status: DC

## 2018-02-12 MED FILL — PROMETHAZINE-DM SYRUP: 6.25-15 | 12 days supply | Qty: 240 | Fill #0

## 2018-02-12 MED FILL — AZITHROMYCIN 250 MG TABLET: 250 | 5 days supply | Qty: 6 | Fill #0

## 2018-02-12 MED FILL — predniSONE 20 MG TABS: 20 | 7 days supply | Qty: 10 | Fill #0

## 2018-02-12 NOTE — Progress Notes (Signed)
Assessment and Plan:  Tonya [name] was seen today for sore throat and cough.  Diagnoses and all orders for this visit:  Acute laryngopharyngitis Exam doesn't suggest strep Suggested symptomatic OTC remedies - salt water gargles, voice rest, hydration, immune support, throat lozenges  Nasal saline spray for congestion. Nasal steroids, allergy pill, oral steroids offered Follow up as needed. -     azithromycin (ZITHROMAX) 250 MG tablet; Take 2 tablets (500 mg) on  Day 1,  followed by 1 tablet (250 mg) once daily on Days 2 through 5. -     predniSONE (DELTASONE) 20 MG tablet; 2 tablets daily for 3 days, 1 tablet daily for 4 days. -     promethazine-dextromethorphan (PROMETHAZINE-DM) 6.25-15 MG/5ML syrup; Take 5 mLs by mouth 4 (four) times daily as needed for cough.  Further disposition pending results of labs. Discussed med's effects and SE's.   Over 15 minutes of exam, counseling, chart review, and critical decision making was performed.   Future Appointments  Date Time Provider [place]  06/03/2018  2:00 PM Tonya Comber, NP [facility] None   ------------------------------------------------------------------------------------------------------------------  HPI BP 128/84   Pulse 99   Temp (!) 97 F (36.1 C)   Ht 5\' 5"  (1.651 m)   Wt 183 lb (83 kg)   SpO2 99%   BMI 30.45 kg/m   32 y.o.female presents for evaluation of dry/sore throat that began after her child got ill at daycare about 7-9 days ago; she denies fever/chills, endorses mild achiness, mildly productive hacky cough. She endorses mild nasal congestion, post nasal drip. She has taken some ibuprofen for throat pain. She has also taken tylenol PM. Feels like swallowing razor blades.   She does have seasonal allergies but this is typically not when she has problems.   Past Medical History:  Diagnosis Date  . ADD (attention deficit disorder)   . Anxiety   . Gestational diabetes   . Hyperlipidemia      No Known  Allergies  Current Outpatient Medications on File Prior to Visit  Medication Sig  . amphetamine-dextroamphetamine (ADDERALL) 20 MG tablet Take 1 to 2 tablets daily, not to exceed 5 days a week.  . Cholecalciferol 5000 units capsule Take 1 capsule (5,000 Units total) by mouth daily.  . Dapsone (ACZONE) 7.5 % GEL Apply topically. Daily at bedtime  . fenofibrate 160 MG tablet Take 1 tablet (160 mg total) by mouth daily.  . norethindrone-ethinyl estradiol (CYCLAFEM,ALYACEN) 0.5/0.75/1-35 MG-MCG tablet Take 1 tablet by mouth daily.  . Omega 3 1000 MG CAPS Take 1 capsule (1,000 mg total) by mouth daily.  Marland Kitchen SPIRONOLACTONE PO Take 100 mg by mouth.   . sulfamethoxazole-trimethoprim (BACTRIM DS,SEPTRA DS) 800-160 MG tablet Take 1 tablet by mouth 2 (two) times daily.  Marland Kitchen tretinoin (RETIN-A) 0.025 % cream Apply topically at bedtime.   No current facility-administered medications on file prior to visit.     ROS: all negative except above.   Physical Exam:  BP 128/84   Pulse 99   Temp (!) 97 F (36.1 C)   Ht 5\' 5"  (1.651 m)   Wt 183 lb (83 kg)   SpO2 99%   BMI 30.45 kg/m   General Appearance: Well nourished, in no apparent distress. Eyes: PERRLA, EOMs, conjunctiva no swelling or erythema Sinuses: No Frontal/maxillary tenderness ENT/Mouth: Ext aud canals clear, TMs without erythema, bulging. No erythema, swelling, or exudate on post pharynx.  Tonsils not swollen or erythematous. Hearing normal.  Neck: Supple, thyroid normal.  Respiratory: Respiratory  effort normal, BS equal bilaterally without rales, rhonchi, wheezing or stridor.  Cardio: RRR with no MRGs. Brisk peripheral pulses without edema.  Abdomen: Soft, + BS.  Non tender. Lymphatics: Non tender without lymphadenopathy.  Musculoskeletal: Symmetrical strength, normal gait.  Skin: Warm, dry without rashes, lesions, ecchymosis.  Neuro: Cranial nerves intact.  Psych: Awake and oriented X 3, normal affect, Insight and Judgment  appropriate.     Tonya Ribas, NP 11:26 AM [name]

## 2018-02-12 NOTE — Patient Instructions (Signed)

## 2018-03-13 ENCOUNTER — Other Ambulatory Visit: Payer: Self-pay | Admitting: Adult Health

## 2018-03-13 ENCOUNTER — Telehealth: Payer: Self-pay | Admitting: Internal Medicine

## 2018-03-13 DIAGNOSIS — F988 Other specified behavioral and emotional disorders with onset usually occurring in childhood and adolescence: Secondary | ICD-10-CM

## 2018-03-13 MED ORDER — AMPHETAMINE-DEXTROAMPHETAMINE 20 MG PO TABS: ORAL_TABLET | ORAL | 0 refills | Status: DC

## 2018-03-13 MED FILL — OMEGA-3 1000 MG CAPS: 1000 | 60 days supply | Qty: 60 | Fill #1

## 2018-03-13 MED FILL — DEXTROAMP-AMPHETAMIN 20 MG: 20 | 28 days supply | Qty: 40 | Fill #0

## 2018-03-13 NOTE — Telephone Encounter (Signed)
Patient is calling and asking for a refill of Adderall  [address] [place] [address] Phone: [phone] Fax: [phone]

## 2018-03-15 MED FILL — TRI FEMYNOR 28 TABLET: 0.18/0.215/ | 28 days supply | Qty: 28 | Fill #0

## 2018-03-26 DIAGNOSIS — Z6829 Body mass index (BMI) 29.0-29.9, adult: Secondary | ICD-10-CM | POA: Diagnosis not present

## 2018-03-26 DIAGNOSIS — Z01419 Encounter for gynecological examination (general) (routine) without abnormal findings: Secondary | ICD-10-CM | POA: Diagnosis not present

## 2018-04-09 DIAGNOSIS — Z809 Family history of malignant neoplasm, unspecified: Secondary | ICD-10-CM | POA: Diagnosis not present

## 2018-04-09 DIAGNOSIS — Z1329 Encounter for screening for other suspected endocrine disorder: Secondary | ICD-10-CM | POA: Diagnosis not present

## 2018-04-09 DIAGNOSIS — Z1321 Encounter for screening for nutritional disorder: Secondary | ICD-10-CM | POA: Diagnosis not present

## 2018-04-09 DIAGNOSIS — Z13228 Encounter for screening for other metabolic disorders: Secondary | ICD-10-CM | POA: Diagnosis not present

## 2018-04-09 DIAGNOSIS — Z8042 Family history of malignant neoplasm of prostate: Secondary | ICD-10-CM | POA: Diagnosis not present

## 2018-04-09 DIAGNOSIS — Z8049 Family history of malignant neoplasm of other genital organs: Secondary | ICD-10-CM | POA: Diagnosis not present

## 2018-04-09 DIAGNOSIS — Z13220 Encounter for screening for lipoid disorders: Secondary | ICD-10-CM | POA: Diagnosis not present

## 2018-04-09 DIAGNOSIS — Z803 Family history of malignant neoplasm of breast: Secondary | ICD-10-CM | POA: Diagnosis not present

## 2018-04-15 ENCOUNTER — Telehealth: Payer: Self-pay | Admitting: Physician Assistant

## 2018-04-15 DIAGNOSIS — F988 Other specified behavioral and emotional disorders with onset usually occurring in childhood and adolescence: Secondary | ICD-10-CM

## 2018-04-15 MED ORDER — AMPHETAMINE-DEXTROAMPHETAMINE 20 MG PO TABS: ORAL_TABLET | ORAL | 0 refills | Status: DC

## 2018-04-15 MED FILL — DEXTROAMP-AMPHETAMIN 20 MG: 20 | 28 days supply | Qty: 40 | Fill #0

## 2018-04-15 NOTE — Telephone Encounter (Signed)
-----   Message from Elenor Quinones, [place] sent at 04/15/2018 11:31 AM EDT ----- Regarding: REFILL PER PT/YELLOW NOTE:  Refill on ADDERALL Please & thank you!  Pharmacy:[facility]

## 2018-04-16 MED FILL — TRI FEMYNOR 28 TABLET: 0.18/0.215/ | 84 days supply | Qty: 84 | Fill #0

## 2018-04-16 MED FILL — FENOFIBRATE 160 MG TABLET: 160 | 90 days supply | Qty: 90 | Fill #1

## 2018-05-16 ENCOUNTER — Other Ambulatory Visit: Payer: Self-pay | Admitting: Adult Health

## 2018-05-16 DIAGNOSIS — F988 Other specified behavioral and emotional disorders with onset usually occurring in childhood and adolescence: Secondary | ICD-10-CM

## 2018-05-16 MED ORDER — AMPHETAMINE-DEXTROAMPHETAMINE 20 MG PO TABS: ORAL_TABLET | ORAL | 0 refills | Status: DC

## 2018-05-17 MED FILL — AMPHETAMINE SALTS 20 MG TAB: 20 | 28 days supply | Qty: 40 | Fill #0

## 2018-06-03 ENCOUNTER — Encounter: Payer: Self-pay | Admitting: Adult Health

## 2018-06-04 DIAGNOSIS — Z809 Family history of malignant neoplasm, unspecified: Secondary | ICD-10-CM | POA: Diagnosis not present

## 2018-06-07 ENCOUNTER — Other Ambulatory Visit: Payer: Self-pay | Admitting: Adult Health

## 2018-06-07 MED ORDER — PREDNISONE 20 MG PO TABS: ORAL_TABLET | ORAL | 0 refills | Status: DC

## 2018-06-07 MED FILL — predniSONE 20 MG TABS: 20 | 7 days supply | Qty: 10 | Fill #0

## 2018-06-10 NOTE — Progress Notes (Signed)
Complete Physical  Tonya [name] was seen today for annual exam.  Diagnoses and all orders for this visit:  Assessment and Plan: Encounter for annual physical exam -     CBC with Differential/Platelet -     COMPLETE METABOLIC PANEL WITH GFR -     Magnesium -     Lipid panel -     Hemoglobin A1c -     Insulin, random -     VITAMIN D 25 Hydroxy (Vit-D Deficiency, Fractures) -     Urinalysis w microscopic + reflex cultur -     Vitamin B12 -     Microalbumin / creatinine urine ratio -     TSH  Attention deficit disorder (ADD) without hyperactivity Doing well Using adderall 20mg  tablet Doing well with this Continue with benefit  Mixed hyperlipidemia Taking fenofibrate 160mg  daily, tolerating well Taking Omega 3 1,000mg  caps -     Lipid panel  Anxiety Doing well at this time Reports she has had some increase in this Going to make appointment for medication, was on prozac did not tolerate well Discussed Wellbutrin with patient Contact office for appointment if unable to see psychiatrist  Screening for blood or protein in urine -     Urinalysis w microscopic + reflex cultur -     Microalbumin / creatinine urine ratio  Fatigue, unspecified type -     CBC with Differential/Platelet -     COMPLETE METABOLIC PANEL WITH GFR -     VITAMIN D 25 Hydroxy (Vit-D Deficiency, Fractures) -     Vitamin B12 -     TSH Discussed dietary changes to help with this Discussed setting reminders to take Vit D   Hearing impaired person, left Cerumen impaction bilateral Lavage complete in office with warm water/peroxide Manual removal of cerumen Ear hygiene discussed  BMI 28.0-28.9,adult -     Hemoglobin A1c -     Insulin, random  Medication management -     CBC with Differential/Platelet -     COMPLETE METABOLIC PANEL WITH GFR -     Magnesium -     Hemoglobin A1c -     Insulin, random -     Vitamin B12   Follow up in 6 moths for routine medication management  Call or return with  new or worsening symptoms as discussed in appointment.  May contact via office phone [phone] or via [place].   Health Maintenance- Discussed STD testing, safe sex, alcohol and drug awareness, drinking and driving dangers, wearing a seat belt and general safety measures for young adult. . Discussed med's effects and SE's. Screening labs and tests as requested with regular follow-up as recommended. Over 40 minutes of exam, counseling, chart review and critical decision making was performed  HPI  This very nice 32 y.o.female presents for complete physical.  Patient has no major health issues.  Patient reports no complaints at this time.  She has history of ADD and taking adderall for this.  Reports she is doing well and only takes this when she needs it.  She does reports some increase in anxiety and has been seen by a psychiatrist in the past and was on prozac.  Reports she did not like this because she gained weight and felt like it did not help with her symptoms.  She is going to make an appointment to further discuss alternatives to help her mage this.  She does workout. 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 She has history of gestational diabetes and concerned about her A1c.  She is currently   Finally, patient has history of Vitamin D Deficiency and last vitamin D was  Lab Results  Component Value Date   VD25OH 22 (L) 05/31/2017  .  Currently on supplementation but reports she frequently forgets to take this.   Current Medications:  Current Outpatient Medications on File Prior to Visit  Medication Sig Dispense Refill  . amphetamine-dextroamphetamine (ADDERALL) 20 MG tablet Take 1 to 2 tablets daily, not to exceed 5 days a week. 40 tablet 0  . Cholecalciferol 5000 units capsule Take 1 capsule (5,000 Units total) by mouth daily. 90 capsule 1  . fenofibrate 160 MG tablet Take 1 tablet (160 mg total) by mouth daily. 90 tablet 1   . Omega 3 1000 MG CAPS Take 1 capsule (1,000 mg total) by mouth daily. 90 each 1   No current facility-administered medications on file prior to visit.    Health Maintenance:   Immunization History  Administered Date(s) Administered  . Influenza Split 04/17/2013  . Tdap 02/24/2015    TD/TDAP: Influenza: Pneumovax:  Prevnar 13:  HPV vaccines:   LMP: Patient's last menstrual period was 06/05/2018. Sexually Active: yes STD testing offered Pap: 2018   Allergies: No Known Allergies Medical History:  has Anxiety; ADD (attention deficit disorder); Hyperlipidemia; S/P cesarean section; and Obesity (BMI 30.0-34.9) on their problem list. Surgical History:  She  has a past surgical history that includes Wisdom tooth extraction and Cesarean section (N/A, 12/03/2016). Family History:  Her family history includes Cancer in her father; Depression in her mother; Hypertension in her father and mother. Social History:   reports that she has never smoked. She has never used smokeless tobacco. She reports that she does not drink alcohol or use drugs.  Review of Systems: ROS  Physical Exam: Estimated body mass index is 28.57 kg/m as calculated from the following:   Height as of this encounter: 5\' 6"  (1.676 m).   Weight as of this encounter: 177 lb (80.3 kg). BP 118/66   Pulse 68   Temp 97.7 F (36.5 C)   Ht 5\' 6"  (1.676 m)   Wt 177 lb (80.3 kg)   LMP 06/05/2018   SpO2 99%   BMI 28.57 kg/m  General Appearance: Well nourished, in no apparent distress.  Eyes: PERRLA, EOMs, conjunctiva no swelling or erythema, normal fundi and vessels.  Sinuses: No Frontal/maxillary tenderness  ENT/Mouth: Cerumen impaction, bilateral.  Good dentition. No erythema, swelling, or exudate on post pharynx. Tonsils not swollen or erythematous. Hearing decreased left. Neck: Supple, thyroid normal. No bruits  Respiratory: Respiratory effort normal, BS equal bilaterally without rales, rhonchi, wheezing or  stridor.  Cardio: RRR without murmurs, rubs or gallops. Brisk peripheral pulses without edema.  Chest: symmetric, with normal excursions and percussion.  Breasts: Symmetric, without lumps, nipple discharge, retractions.  Abdomen: Soft, nontender, no guarding, rebound, hernias, masses, or organomegaly.  Lymphatics: Non tender without lymphadenopathy.  Genitourinary:  Musculoskeletal: Full ROM all peripheral extremities,5/5 strength, and normal gait.  Skin: Warm, dry without rashes, lesions, ecchymosis. Skin tag under left axilla. Neuro: Cranial nerves intact, reflexes equal bilaterally. Normal muscle tone, no cerebellar symptoms. Sensation intact.  Psych: Awake and oriented X 3, normal affect, Insight and Judgment appropriate.   EKG: defer   Names of Other Physician/Practitioners you currently use: 1. [facility] here for primary care 2. Eye Exam,  Due 3. Dentist, every 6 months  Patient Care Team: Unk Pinto, MD as PCP - General (Internal Medicine) Vernell Morgans, MD as Referring Physician (Obstetrics and Gynecology)    Screening Tests: Immunization History  Administered Date(s) Administered  . Influenza Split 04/17/2013  . Tdap 02/24/2015    Preventative care: Last pap smear/pelvic exam: 2016   Vaccinations: TD or Tdap: 2018  Influenza: 2019    Liliani Bobo 1:11 PM [facility]

## 2018-06-11 ENCOUNTER — Ambulatory Visit (INDEPENDENT_AMBULATORY_CARE_PROVIDER_SITE_OTHER): Payer: 59 | Admitting: Adult Health Nurse Practitioner

## 2018-06-11 ENCOUNTER — Encounter: Payer: Self-pay | Admitting: Adult Health Nurse Practitioner

## 2018-06-11 VITALS — BP 118/66 | HR 68 | Temp 97.7°F | Ht 66.0 in | Wt 177.0 lb

## 2018-06-11 DIAGNOSIS — F419 Anxiety disorder, unspecified: Secondary | ICD-10-CM

## 2018-06-11 DIAGNOSIS — E782 Mixed hyperlipidemia: Secondary | ICD-10-CM

## 2018-06-11 DIAGNOSIS — Z79899 Other long term (current) drug therapy: Secondary | ICD-10-CM | POA: Diagnosis not present

## 2018-06-11 DIAGNOSIS — F988 Other specified behavioral and emotional disorders with onset usually occurring in childhood and adolescence: Secondary | ICD-10-CM

## 2018-06-11 DIAGNOSIS — H9192 Unspecified hearing loss, left ear: Secondary | ICD-10-CM

## 2018-06-11 DIAGNOSIS — Z6828 Body mass index (BMI) 28.0-28.9, adult: Secondary | ICD-10-CM

## 2018-06-11 DIAGNOSIS — R5383 Other fatigue: Secondary | ICD-10-CM

## 2018-06-11 DIAGNOSIS — Z1389 Encounter for screening for other disorder: Secondary | ICD-10-CM | POA: Diagnosis not present

## 2018-06-11 DIAGNOSIS — Z0000 Encounter for general adult medical examination without abnormal findings: Secondary | ICD-10-CM | POA: Diagnosis not present

## 2018-06-11 NOTE — Patient Instructions (Addendum)
Ear care: Both of your ears are clear of wax at this time To maintain this, use 2-3 drops of oil for next 3-4 nights Use warm or room temp peroxide in ear once a week, then apply oil that night x1. Do not use cotton swabs in your ears Should you have pain, decrease in your ability to hear or dizziness please contact the office for an appointment.  We will contact you via MyChart for your lab results in 1-2 days.  You are up to date on your health screenings and immunizations.   Preventive Care for Adults  A healthy lifestyle and preventive care can promote health and wellness. Preventive health guidelines for women include the following key practices.  A routine yearly physical is a good way to check with your health care provider about your health and preventive screening. It is a chance to share any concerns and updates on your health and to receive a thorough exam.  Visit your dentist for a routine exam and preventive care every 6 months. Brush your teeth twice a day and floss once a day. Good oral hygiene prevents tooth decay and gum disease.  The frequency of eye exams is based on your age, health, family medical history, use of contact lenses, and other factors. Follow your health care provider's recommendations for frequency of eye exams.  Eat a healthy diet. Foods like vegetables, fruits, whole grains, low-fat dairy products, and lean protein foods contain the nutrients you need without too many calories. Decrease your intake of foods high in solid fats, added sugars, and salt. Eat the right amount of calories for you.Get information about a proper diet from your health care provider, if necessary.  Regular physical exercise is one of the most important things you can do for your health. Most adults should get at least 150 minutes of moderate-intensity exercise (any activity that increases your heart rate and causes you to sweat) each week. In addition, most adults need  muscle-strengthening exercises on 2 or more days a week.  Maintain a healthy weight. The body mass index (BMI) is a screening tool to identify possible weight problems. It provides an estimate of body fat based on height and weight. Your health care provider can find your BMI and can help you achieve or maintain a healthy weight.For adults 20 years and older:  A BMI below 18.5 is considered underweight.  A BMI of 18.5 to 24.9 is normal.  A BMI of 25 to 29.9 is considered overweight.  A BMI of 30 and above is considered obese.  Maintain normal blood lipids and cholesterol levels by exercising and minimizing your intake of saturated fat. Eat a balanced diet with plenty of fruit and vegetables. Blood tests for lipids and cholesterol should begin at age 80 and be repeated every 5 years. If your lipid or cholesterol levels are high, you are over 50, or you are at high risk for heart disease, you may need your cholesterol levels checked more frequently.Ongoing high lipid and cholesterol levels should be treated with medicines if diet and exercise are not working.  If you smoke, find out from your health care provider how to quit. If you do not use tobacco, do not start.  Lung cancer screening is recommended for adults aged 69-80 years who are at high risk for developing lung cancer because of a history of smoking. A yearly low-dose CT scan of the lungs is recommended for people who have at least a 30-pack-year history of smoking  and are a current smoker or have quit within the past 15 years. A pack year of smoking is smoking an average of 1 pack of cigarettes a day for 1 year (for example: 1 pack a day for 30 years or 2 packs a day for 15 years). Yearly screening should continue until the smoker has stopped smoking for at least 15 years. Yearly screening should be stopped for people who develop a health problem that would prevent them from having lung cancer treatment.  If you are pregnant, do not  drink alcohol. If you are breastfeeding, be very cautious about drinking alcohol. If you are not pregnant and choose to drink alcohol, do not have more than 1 drink per day. One drink is considered to be 12 ounces (355 mL) of beer, 5 ounces (148 mL) of wine, or 1.5 ounces (44 mL) of liquor.  Avoid use of street drugs. Do not share needles with anyone. Ask for help if you need support or instructions about stopping the use of drugs.  High blood pressure causes heart disease and increases the risk of stroke. Your blood pressure should be checked at least every 1 to 2 years. Ongoing high blood pressure should be treated with medicines if weight loss and exercise do not work.  If you are 2-40 years old, ask your health care provider if you should take aspirin to prevent strokes.  Diabetes screening involves taking a blood sample to check your fasting blood sugar level. This should be done once every 3 years, after age 63, if you are within normal weight and without risk factors for diabetes. Testing should be considered at a younger age or be carried out more frequently if you are overweight and have at least 1 risk factor for diabetes.  Breast cancer screening is essential preventive care for women. You should practice "breast self-awareness." This means understanding the normal appearance and feel of your breasts and may include breast self-examination. Any changes detected, no matter how small, should be reported to a health care provider. Women in their 51s and 30s should have a clinical breast exam (CBE) by a health care provider as part of a regular health exam every 1 to 3 years. After age 71, women should have a CBE every year. Starting at age 24, women should consider having a mammogram (breast X-ray test) every year. Women who have a family history of breast cancer should talk to their health care provider about genetic screening. Women at a high risk of breast cancer should talk to their health  care providers about having an MRI and a mammogram every year.  Breast cancer gene (BRCA)-related cancer risk assessment is recommended for women who have family members with BRCA-related cancers. BRCA-related cancers include breast, ovarian, tubal, and peritoneal cancers. Having family members with these cancers may be associated with an increased risk for harmful changes (mutations) in the breast cancer genes BRCA1 and BRCA2. Results of the assessment will determine the need for genetic counseling and BRCA1 and BRCA2 testing.  Routine pelvic exams to screen for cancer are no longer recommended for nonpregnant women who are considered low risk for cancer of the pelvic organs (ovaries, uterus, and vagina) and who do not have symptoms. Ask your health care provider if a screening pelvic exam is right for you.  If you have had past treatment for cervical cancer or a condition that could lead to cancer, you need Pap tests and screening for cancer for at least 20 years after  your treatment. If Pap tests have been discontinued, your risk factors (such as having a new sexual partner) need to be reassessed to determine if screening should be resumed. Some women have medical problems that increase the chance of getting cervical cancer. In these cases, your health care provider may recommend more frequent screening and Pap tests.  The HPV test is an additional test that may be used for cervical cancer screening. The HPV test looks for the virus that can cause the cell changes on the cervix. The cells collected during the Pap test can be tested for HPV. The HPV test could be used to screen women aged 74 years and older, and should be used in women of any age who have unclear Pap test results. After the age of 40, women should have HPV testing at the same frequency as a Pap test.  Colorectal cancer can be detected and often prevented. Most routine colorectal cancer screening begins at the age of 59 years and  continues through age 16 years. However, your health care provider may recommend screening at an earlier age if you have risk factors for colon cancer. On a yearly basis, your health care provider may provide home test kits to check for hidden blood in the stool. Use of a small camera at the end of a tube, to directly examine the colon (sigmoidoscopy or colonoscopy), can detect the earliest forms of colorectal cancer. Talk to your health care provider about this at age 19, when routine screening begins. Direct exam of the colon should be repeated every 5-10 years through age 65 years, unless early forms of pre-cancerous polyps or small growths are found.  People who are at an increased risk for hepatitis B should be screened for this virus. You are considered at high risk for hepatitis B if:  You were born in a country where hepatitis B occurs often. Talk with your health care provider about which countries are considered high risk.  Your parents were born in a high-risk country and you have not received a shot to protect against hepatitis B (hepatitis B vaccine).  You have HIV or AIDS.  You use needles to inject street drugs.  You live with, or have sex with, someone who has hepatitis B.  You get hemodialysis treatment.  You take certain medicines for conditions like cancer, organ transplantation, and autoimmune conditions.  Hepatitis C blood testing is recommended for all people born from [birth year] through 1965 and any individual with known risks for hepatitis C.  Practice safe sex. Use condoms and avoid high-risk sexual practices to reduce the spread of sexually transmitted infections (STIs). STIs include gonorrhea, chlamydia, syphilis, trichomonas, herpes, HPV, and human immunodeficiency virus (HIV). Herpes, HIV, and HPV are viral illnesses that have no cure. They can result in disability, cancer, and death.  You should be screened for sexually transmitted illnesses (STIs) including gonorrhea  and chlamydia if:  You are sexually active and are younger than 24 years.  You are older than 24 years and your health care provider tells you that you are at risk for this type of infection.  Your sexual activity has changed since you were last screened and you are at an increased risk for chlamydia or gonorrhea. Ask your health care provider if you are at risk.  If you are at risk of being infected with HIV, it is recommended that you take a prescription medicine daily to prevent HIV infection. This is called preexposure prophylaxis (PrEP). You are considered  at risk if:  You are a heterosexual woman, are sexually active, and are at increased risk for HIV infection.  You take drugs by injection.  You are sexually active with a partner who has HIV.  Talk with your health care provider about whether you are at high risk of being infected with HIV. If you choose to begin PrEP, you should first be tested for HIV. You should then be tested every 3 months for as long as you are taking PrEP.  Osteoporosis is a disease in which the bones lose minerals and strength with aging. This can result in serious bone fractures or breaks. The risk of osteoporosis can be identified using a bone density scan. Women ages 15 years and over and women at risk for fractures or osteoporosis should discuss screening with their health care providers. Ask your health care provider whether you should take a calcium supplement or vitamin D to reduce the rate of osteoporosis.  Menopause can be associated with physical symptoms and risks. Hormone replacement therapy is available to decrease symptoms and risks. You should talk to your health care provider about whether hormone replacement therapy is right for you.  Use sunscreen. Apply sunscreen liberally and repeatedly throughout the day. You should seek shade when your shadow is shorter than you. Protect yourself by wearing long sleeves, pants, a wide-brimmed hat, and  sunglasses year round, whenever you are outdoors.  Once a month, do a whole body skin exam, using a mirror to look at the skin on your back. Tell your health care provider of new moles, moles that have irregular borders, moles that are larger than a pencil eraser, or moles that have changed in shape or color.  Stay current with required vaccines (immunizations).  Influenza vaccine. All adults should be immunized every year.  Tetanus, diphtheria, and acellular pertussis (Td, Tdap) vaccine. Pregnant women should receive 1 dose of Tdap vaccine during each pregnancy. The dose should be obtained regardless of the length of time since the last dose. Immunization is preferred during the 27th-36th week of gestation. An adult who has not previously received Tdap or who does not know her vaccine status should receive 1 dose of Tdap. This initial dose should be followed by tetanus and diphtheria toxoids (Td) booster doses every 10 years. Adults with an unknown or incomplete history of completing a 3-dose immunization series with Td-containing vaccines should begin or complete a primary immunization series including a Tdap dose. Adults should receive a Td booster every 10 years.  Varicella vaccine. An adult without evidence of immunity to varicella should receive 2 doses or a second dose if she has previously received 1 dose. Pregnant females who do not have evidence of immunity should receive the first dose after pregnancy. This first dose should be obtained before leaving the health care facility. The second dose should be obtained 4-8 weeks after the first dose.  Human papillomavirus (HPV) vaccine. Females aged 13-26 years who have not received the vaccine previously should obtain the 3-dose series. The vaccine is not recommended for use in pregnant females. However, pregnancy testing is not needed before receiving a dose. If a female is found to be pregnant after receiving a dose, no treatment is needed. In that  case, the remaining doses should be delayed until after the pregnancy. Immunization is recommended for any person with an immunocompromised condition through the age of 53 years if she did not get any or all doses earlier. During the 3-dose series, the second  dose should be obtained 4-8 weeks after the first dose. The third dose should be obtained 24 weeks after the first dose and 16 weeks after the second dose.  Zoster vaccine. One dose is recommended for adults aged 79 years or older unless certain conditions are present.  Measles, mumps, and rubella (MMR) vaccine. Adults born before [birth year] generally are considered immune to measles and mumps. Adults born in [birth year] or later should have 1 or more doses of MMR vaccine unless there is a contraindication to the vaccine or there is laboratory evidence of immunity to each of the three diseases. A routine second dose of MMR vaccine should be obtained at least 28 days after the first dose for students attending postsecondary schools, health care workers, or international travelers. People who received inactivated measles vaccine or an unknown type of measles vaccine during 1963-1967 should receive 2 doses of MMR vaccine. People who received inactivated mumps vaccine or an unknown type of mumps vaccine before 1979 and are at high risk for mumps infection should consider immunization with 2 doses of MMR vaccine. For females of childbearing age, rubella immunity should be determined. If there is no evidence of immunity, females who are not pregnant should be vaccinated. If there is no evidence of immunity, females who are pregnant should delay immunization until after pregnancy. Unvaccinated health care workers born before [birth year] who lack laboratory evidence of measles, mumps, or rubella immunity or laboratory confirmation of disease should consider measles and mumps immunization with 2 doses of MMR vaccine or rubella immunization with 1 dose of MMR vaccine.  Pneumococcal  13-valent conjugate (PCV13) vaccine. When indicated, a person who is uncertain of her immunization history and has no record of immunization should receive the PCV13 vaccine. An adult aged 72 years or older who has certain medical conditions and has not been previously immunized should receive 1 dose of PCV13 vaccine. This PCV13 should be followed with a dose of pneumococcal polysaccharide (PPSV23) vaccine. The PPSV23 vaccine dose should be obtained at least 8 weeks after the dose of PCV13 vaccine. An adult aged 20 years or older who has certain medical conditions and previously received 1 or more doses of PPSV23 vaccine should receive 1 dose of PCV13. The PCV13 vaccine dose should be obtained 1 or more years after the last PPSV23 vaccine dose.  Pneumococcal polysaccharide (PPSV23) vaccine. When PCV13 is also indicated, PCV13 should be obtained first. All adults aged 101 years and older should be immunized. An adult younger than age 31 years who has certain medical conditions should be immunized. Any person who resides in a nursing home or long-term care facility should be immunized. An adult smoker should be immunized. People with an immunocompromised condition and certain other conditions should receive both PCV13 and PPSV23 vaccines. People with human immunodeficiency virus (HIV) infection should be immunized as soon as possible after diagnosis. Immunization during chemotherapy or radiation therapy should be avoided. Routine use of PPSV23 vaccine is not recommended for American Indians, [place] Natives, or people younger than 65 years unless there are medical conditions that require PPSV23 vaccine. When indicated, people who have unknown immunization and have no record of immunization should receive PPSV23 vaccine. One-time revaccination 5 years after the first dose of PPSV23 is recommended for people aged 19-64 years who have chronic kidney failure, nephrotic syndrome, asplenia, or immunocompromised conditions.  People who received 1-2 doses of PPSV23 before age 9 years should receive another dose of PPSV23 vaccine at age 82 years or later  if at least 5 years have passed since the previous dose. Doses of PPSV23 are not needed for people immunized with PPSV23 at or after age 43 years.  Meningococcal vaccine. Adults with asplenia or persistent complement component deficiencies should receive 2 doses of quadrivalent meningococcal conjugate (MenACWY-D) vaccine. The doses should be obtained at least 2 months apart. Microbiologists working with certain meningococcal bacteria, [employer] recruits, people at risk during an outbreak, and people who travel to or live in countries with a high rate of meningitis should be immunized. A first-year college student up through age 92 years who is living in a residence hall should receive a dose if she did not receive a dose on or after her 16th birthday. Adults who have certain high-risk conditions should receive one or more doses of vaccine.  Hepatitis A vaccine. Adults who wish to be protected from this disease, have certain high-risk conditions, work with hepatitis A-infected animals, work in hepatitis A research labs, or travel to or work in countries with a high rate of hepatitis A should be immunized. Adults who were previously unvaccinated and who anticipate close contact with an international adoptee during the first 60 days after arrival in the [place] from a country with a high rate of hepatitis A should be immunized.  Hepatitis B vaccine. Adults who wish to be protected from this disease, have certain high-risk conditions, may be exposed to blood or other infectious body fluids, are household contacts or sex partners of hepatitis B positive people, are clients or workers in certain care facilities, or travel to or work in countries with a high rate of hepatitis B should be immunized.  Haemophilus influenzae type b (Hib) vaccine. A previously unvaccinated person with  asplenia or sickle cell disease or having a scheduled splenectomy should receive 1 dose of Hib vaccine. Regardless of previous immunization, a recipient of a hematopoietic stem cell transplant should receive a 3-dose series 6-12 months after her successful transplant. Hib vaccine is not recommended for adults with HIV infection. Preventive Services / Frequency  Ages 27 to 76 years  Blood pressure check.  Lipid and cholesterol check.  Clinical breast exam.** / Every 3 years for women in their 71s and 67s.  BRCA-related cancer risk assessment.** / For women who have family members with a BRCA-related cancer (breast, ovarian, tubal, or peritoneal cancers).  Pap test.** / Every 2 years from ages 52 through 42. Every 3 years starting at age 15 through age 57 or 45 with a history of 3 consecutive normal Pap tests.  HPV screening.** / Every 3 years from ages 69 through ages 30 to 15 with a history of 3 consecutive normal Pap tests.  Hepatitis C blood test.** / For any individual with known risks for hepatitis C.  Skin self-exam. / Monthly.  Influenza vaccine. / Every year.  Tetanus, diphtheria, and acellular pertussis (Tdap, Td) vaccine.** / Consult your health care provider. Pregnant women should receive 1 dose of Tdap vaccine during each pregnancy. 1 dose of Td every 10 years.  Varicella vaccine.** / Consult your health care provider. Pregnant females who do not have evidence of immunity should receive the first dose after pregnancy.  HPV vaccine. / 3 doses over 6 months, if 52 and younger. The vaccine is not recommended for use in pregnant females. However, pregnancy testing is not needed before receiving a dose.  Measles, mumps, rubella (MMR) vaccine.** / You need at least 1 dose of MMR if you were born in 1957 or  later. You may also need a 2nd dose. For females of childbearing age, rubella immunity should be determined. If there is no evidence of immunity, females who are not pregnant  should be vaccinated. If there is no evidence of immunity, females who are pregnant should delay immunization until after pregnancy.  Pneumococcal 13-valent conjugate (PCV13) vaccine.** / Consult your health care provider.  Pneumococcal polysaccharide (PPSV23) vaccine.** / 1 to 2 doses if you smoke cigarettes or if you have certain conditions.  Meningococcal vaccine.** / 1 dose if you are age 56 to 28 years and a Market researcher living in a residence hall, or have one of several medical conditions, you need to get vaccinated against meningococcal disease. You may also need additional booster doses.  Hepatitis A vaccine.** / Consult your health care provider.  Hepatitis B vaccine.** / Consult your health care provider.  Haemophilus influenzae type b (Hib) vaccine.** / Consult your health care provider. ++++++++ Recommend Adult Low Dose Aspirin or  coated  Aspirin 81 mg daily  To reduce risk of Colon Cancer 20 %,  Skin Cancer 26 % ,  Melanoma 46%  and  Pancreatic cancer 60% ++++++++++++++++++ Vitamin D goal  is between 70-100.  Please make sure that you are taking your Vitamin D as directed.  It is very important as a natural anti-inflammatory  helping hair, skin, and nails, as well as reducing stroke and heart attack risk.  It helps your bones and helps with mood. It also decreases numerous cancer risks so please take it as directed.  Low Vit D is associated with a 200-300% higher risk for CANCER  and 200-300% higher risk for HEART   ATTACK  &  STROKE.   .....................................Marland Kitchen It is also associated with higher death rate at younger ages,  autoimmune diseases like Rheumatoid arthritis, Lupus, Multiple Sclerosis.    Also many other serious conditions, like depression, Alzheimer's Dementia, infertility, muscle aches, fatigue, fibromyalgia - just to name a few. ++++++++++++++++++++ Recommend the book "The END of DIETING" by Dr Excell Seltzer  & the book "The  END of DIABETES " by Dr Excell Seltzer At [email or web address] - get book & Audio CD's    Being diabetic has a  300% increased risk for heart attack, stroke, cancer, and alzheimer- type vascular dementia. It is very important that you work harder with diet by avoiding all foods that are white. Avoid white rice (brown & wild rice is OK), white potatoes (sweetpotatoes in moderation is OK), White bread or wheat bread or anything made out of white flour like bagels, donuts, rolls, buns, biscuits, cakes, pastries, cookies, pizza crust, and pasta (made from white flour & egg whites) - vegetarian pasta or spinach or wheat pasta is OK. Multigrain breads like Arnold's or [employer], or multigrain sandwich thins or flatbreads.  Diet, exercise and weight loss can reverse and cure diabetes in the early stages.  Diet, exercise and weight loss is very important in the control and prevention of complications of diabetes which affects every system in your body, ie. Brain - dementia/stroke, eyes - glaucoma/blindness, heart - heart attack/heart failure, kidneys - dialysis, stomach - gastric paralysis, intestines - malabsorption, nerves - severe painful neuritis, circulation - gangrene & loss of a leg(s), and finally cancer and Alzheimers.    I recommend avoid fried & greasy foods,  sweets/candy, white rice (brown or wild rice or Quinoa is OK), white potatoes (sweet potatoes are OK) - anything made from white flour - bagels, doughnuts, rolls,  buns, biscuits,white and wheat breads, pizza crust and traditional pasta made of white flour & egg white(vegetarian pasta or spinach or wheat pasta is OK).  Multi-grain bread is OK - like multi-grain flat bread or sandwich thins. Avoid alcohol in excess. Exercise is also important.    Eat all the vegetables you want - avoid meat, especially red meat and dairy - especially cheese.  Cheese is the most concentrated form of trans-fats which is the worst thing to clog up our arteries. Veggie cheese is  OK which can be found in the fresh produce section at [employer] or [employer] or [employer]  ++++++++++++++++++++ DASH Eating Plan  DASH stands for "Dietary Approaches to Stop Hypertension."   The DASH eating plan is a healthy eating plan that has been shown to reduce high blood pressure (hypertension). Additional health benefits may include reducing the risk of type 2 diabetes mellitus, heart disease, and stroke. The DASH eating plan may also help with weight loss. WHAT DO I NEED TO KNOW ABOUT THE DASH EATING PLAN? For the DASH eating plan, you will follow these general guidelines:  Choose foods with a percent daily value for sodium of less than 5% (as listed on the food label).  Use salt-free seasonings or herbs instead of table salt or sea salt.  Check with your health care provider or pharmacist before using salt substitutes.  Eat lower-sodium products, often labeled as "lower sodium" or "no salt added."  Eat fresh foods.  Eat more vegetables, fruits, and low-fat dairy products.  Choose whole grains. Look for the word "whole" as the first word in the ingredient list.  Choose fish   Limit sweets, desserts, sugars, and sugary drinks.  Choose heart-healthy fats.  Eat veggie cheese   Eat more home-cooked food and less restaurant, buffet, and fast food.  Limit fried foods.  Cook foods using methods other than frying.  Limit canned vegetables. If you do use them, rinse them well to decrease the sodium.  When eating at a restaurant, ask that your food be prepared with less salt, or no salt if possible.                      WHAT FOODS CAN I EAT? Read Dr Fara Olden Fuhrman's books on The End of Dieting & The End of Diabetes  Grains Whole grain or whole wheat bread. Brown rice. Whole grain or whole wheat pasta. Quinoa, bulgur, and whole grain cereals. Low-sodium cereals. Corn or whole wheat flour tortillas. Whole grain cornbread. Whole grain crackers. Low-sodium  crackers.  Vegetables Fresh or frozen vegetables (raw, steamed, roasted, or grilled). Low-sodium or reduced-sodium tomato and vegetable juices. Low-sodium or reduced-sodium tomato sauce and paste. Low-sodium or reduced-sodium canned vegetables.   Fruits All fresh, canned (in natural juice), or frozen fruits.  Protein Products  All fish and seafood.  Dried beans, peas, or lentils. Unsalted nuts and seeds. Unsalted canned beans.  Dairy Low-fat dairy products, such as skim or 1% milk, 2% or reduced-fat cheeses, low-fat ricotta or cottage cheese, or plain low-fat yogurt. Low-sodium or reduced-sodium cheeses.  Fats and Oils Tub margarines without trans fats. Light or reduced-fat mayonnaise and salad dressings (reduced sodium). Avocado. Safflower, olive, or canola oils. Natural peanut or almond butter.  Other Unsalted popcorn and pretzels. The items listed above may not be a complete list of recommended foods or beverages. Contact your dietitian for more options.  +++++++++++++++++++  WHAT FOODS ARE NOT RECOMMENDED? Grains/ White flour or wheat  flour White bread. White pasta. White rice. Refined cornbread. Bagels and croissants. Crackers that contain trans fat.  Vegetables  Creamed or fried vegetables. Vegetables in a . Regular canned vegetables. Regular canned tomato sauce and paste. Regular tomato and vegetable juices.  Fruits Dried fruits. Canned fruit in light or heavy syrup. Fruit juice.  Meat and Other Protein Products Meat in general - RED meat & White meat.  Fatty cuts of meat. Ribs, chicken wings, all processed meats as bacon, sausage, bologna, salami, fatback, hot dogs, bratwurst and packaged luncheon meats.  Dairy Whole or 2% milk, cream, half-and-half, and cream cheese. Whole-fat or sweetened yogurt. Full-fat cheeses or blue cheese. Non-dairy creamers and whipped toppings. Processed cheese, cheese spreads, or cheese curds.  Condiments Onion and garlic salt, seasoned  salt, table salt, and sea salt. Canned and packaged gravies. Worcestershire sauce. Tartar sauce. Barbecue sauce. Teriyaki sauce. Soy sauce, including reduced sodium. Steak sauce. Fish sauce. Oyster sauce. Cocktail sauce. Horseradish. Ketchup and mustard. Meat flavorings and tenderizers. Bouillon cubes. Hot sauce. Tabasco sauce. Marinades. Taco seasonings. Relishes.  Fats and Oils Butter, stick margarine, lard, shortening and bacon fat. Coconut, palm kernel, or palm oils. Regular salad dressings.  Pickles and olives. Salted popcorn and pretzels.  The items listed above may not be a complete list of foods and beverages to avoid.

## 2018-06-12 LAB — MICROALBUMIN / CREATININE URINE RATIO
Creatinine, Urine: 196 mg/dL (ref 20–275)
Microalb Creat Ratio: 3 ug/mg{creat} (ref <30)
Microalb, Ur: 0.6 mg/dL

## 2018-06-12 LAB — COMPLETE METABOLIC PANEL WITH GFR
AG Ratio: 1.8 (calc) (ref 1.0–2.5)
Albumin: 4.5 g/dL (ref 3.6–5.1)
Alkaline phosphatase (APISO): 43 U/L (ref 33–115)
BUN: 23 mg/dL (ref 7–25)
CO2: 26 mmol/L (ref 20–32)
Chloride: 107 mmol/L (ref 98–110)
GFR, Est African American: 118 mL/min/{1.73_m2} (ref >=60)
Globulin: 2.5 g/dL (calc) (ref 1.9–3.7)
Glucose, Bld: 103 mg/dL — ABNORMAL HIGH (ref 65–99)
Potassium: 4 mmol/L (ref 3.5–5.3)
Sodium: 140 mmol/L (ref 135–146)
Total Bilirubin: 0.4 mg/dL (ref 0.2–1.2)

## 2018-06-12 LAB — INSULIN, RANDOM: Insulin: 6.4 u[IU]/mL (ref 2.0–19.6)

## 2018-06-12 LAB — CBC WITH DIFFERENTIAL/PLATELET
Absolute Monocytes: 113 cells/uL — ABNORMAL LOW (ref 200–950)
Basophils Absolute: 38 {cells}/uL (ref 0–200)
Basophils Relative: 0.6 %
Eosinophils Absolute: 19 {cells}/uL (ref 15–500)
Eosinophils Relative: 0.3 %
HCT: 40.5 % (ref 35.0–45.0)
Hemoglobin: 13.5 g/dL (ref 11.7–15.5)
Lymphs Abs: 813 {cells}/uL — ABNORMAL LOW (ref 850–3900)
MCH: 28.8 pg (ref 27.0–33.0)
MCHC: 33.3 g/dL (ref 32.0–36.0)
MCV: 86.4 fL (ref 80.0–100.0)
MPV: 10.5 fL (ref 7.5–12.5)
Monocytes Relative: 1.8 %
Neutro Abs: 5317 {cells}/uL (ref 1500–7800)
Neutrophils Relative %: 84.4 %
Platelets: 252 10*3/uL (ref 140–400)
RBC: 4.69 10*6/uL (ref 3.80–5.10)
RDW: 12.1 % (ref 11.0–15.0)
Total Lymphocyte: 12.9 %
WBC: 6.3 10*3/uL (ref 3.8–10.8)

## 2018-06-12 LAB — URINALYSIS W MICROSCOPIC + REFLEX CULTURE
Bacteria, UA: NONE SEEN /HPF
Bilirubin Urine: NEGATIVE
Glucose, UA: NEGATIVE
Hgb urine dipstick: NEGATIVE
Hyaline Cast: NONE SEEN /LPF
Ketones, ur: NEGATIVE
Leukocyte Esterase: NEGATIVE
Nitrites, Initial: NEGATIVE
Protein, ur: NEGATIVE
Specific Gravity, Urine: 1.031 (ref 1.001–1.03)
WBC, UA: NONE SEEN /HPF (ref 0–5)
pH: 6 (ref 5.0–8.0)

## 2018-06-12 LAB — COMPLETE METABOLIC PANEL WITHOUT GFR
ALT: 14 U/L (ref 6–29)
AST: 11 U/L (ref 10–30)
Calcium: 9.8 mg/dL (ref 8.6–10.2)
Creat: 0.77 mg/dL (ref 0.50–1.10)
GFR, Est Non African American: 102 mL/min/1.73m2 (ref >=60)
Total Protein: 7 g/dL (ref 6.1–8.1)

## 2018-06-12 LAB — LIPID PANEL
Cholesterol: 184 mg/dL (ref <200)
HDL: 56 mg/dL (ref >50)
LDL Cholesterol (Calc): 112 mg/dL — ABNORMAL HIGH
Non-HDL Cholesterol (Calc): 128 mg/dL (calc) (ref <130)
Total CHOL/HDL Ratio: 3.3 (calc) (ref <5.0)
Triglycerides: 75 mg/dL (ref <150)

## 2018-06-12 LAB — HEMOGLOBIN A1C
Hgb A1c MFr Bld: 5.1 % of total Hgb (ref <5.7)
Mean Plasma Glucose: 100 (calc)
eAG (mmol/L): 5.5 (calc)

## 2018-06-12 LAB — TSH: TSH: 0.28 m[IU]/L — ABNORMAL LOW

## 2018-06-12 LAB — MAGNESIUM: Magnesium: 2 mg/dL (ref 1.5–2.5)

## 2018-06-12 LAB — VITAMIN D 25 HYDROXY (VIT D DEFICIENCY, FRACTURES): Vit D, 25-Hydroxy: 38 ng/mL (ref 30–100)

## 2018-06-12 LAB — NO CULTURE INDICATED

## 2018-06-12 LAB — VITAMIN B12: Vitamin B-12: 356 pg/mL (ref 200–1100)

## 2018-06-13 ENCOUNTER — Other Ambulatory Visit: Payer: Self-pay | Admitting: Adult Health Nurse Practitioner

## 2018-06-14 ENCOUNTER — Other Ambulatory Visit: Payer: Self-pay | Admitting: Adult Health

## 2018-06-14 ENCOUNTER — Other Ambulatory Visit: Payer: Self-pay

## 2018-06-14 DIAGNOSIS — F988 Other specified behavioral and emotional disorders with onset usually occurring in childhood and adolescence: Secondary | ICD-10-CM

## 2018-06-14 NOTE — Telephone Encounter (Signed)
Refill request for Adderall, PMP checked, last filled on 05/17/18, #40, 28 day supply

## 2018-06-17 MED ORDER — AMPHETAMINE-DEXTROAMPHETAMINE 20 MG PO TABS: ORAL_TABLET | ORAL | 0 refills | Status: DC

## 2018-06-17 MED FILL — DEXTROAMP-AMPHETAMIN 20 MG: 20 | 28 days supply | Qty: 40 | Fill #0

## 2018-06-25 DIAGNOSIS — H52223 Regular astigmatism, bilateral: Secondary | ICD-10-CM | POA: Diagnosis not present

## 2018-07-08 MED FILL — TRI FEMYNOR 28 TABLET: 0.18/0.215/ | 84 days supply | Qty: 84 | Fill #1

## 2018-07-16 ENCOUNTER — Other Ambulatory Visit: Payer: Self-pay | Admitting: Adult Health Nurse Practitioner

## 2018-07-16 ENCOUNTER — Other Ambulatory Visit: Payer: 59

## 2018-07-16 DIAGNOSIS — R7989 Other specified abnormal findings of blood chemistry: Secondary | ICD-10-CM

## 2018-07-16 LAB — TSH: TSH: 1.15 m[IU]/L

## 2018-07-16 NOTE — Progress Notes (Signed)
Patient here for follow up lab draw for TSH. Will contact via MyChart for results.   Garnet Sierras, NP 07/16/18

## 2018-07-18 ENCOUNTER — Other Ambulatory Visit: Payer: Self-pay

## 2018-07-18 ENCOUNTER — Other Ambulatory Visit: Payer: Self-pay | Admitting: Adult Health

## 2018-07-18 DIAGNOSIS — F988 Other specified behavioral and emotional disorders with onset usually occurring in childhood and adolescence: Secondary | ICD-10-CM

## 2018-07-18 NOTE — Telephone Encounter (Signed)
Adderall refill request. PMP checked, last filled on 06/17/18.

## 2018-07-19 ENCOUNTER — Other Ambulatory Visit: Payer: Self-pay | Admitting: Adult Health

## 2018-07-19 MED FILL — DEXTROAMP-AMPHETAMIN 20 MG: 20 | 28 days supply | Qty: 40 | Fill #0

## 2018-08-20 ENCOUNTER — Other Ambulatory Visit: Payer: Self-pay

## 2018-08-20 DIAGNOSIS — F988 Other specified behavioral and emotional disorders with onset usually occurring in childhood and adolescence: Secondary | ICD-10-CM

## 2018-08-20 MED ORDER — AMPHETAMINE-DEXTROAMPHETAMINE 20 MG PO TABS: ORAL_TABLET | ORAL | 0 refills | Status: DC

## 2018-08-20 MED FILL — AMPHETAMINE-DEXTROAMPHETAMI: 20 | 28 days supply | Qty: 40 | Fill #0

## 2018-08-20 NOTE — Telephone Encounter (Signed)
Adderall refill request. PMP checked, last filled on 07/19/18, #40, 28 day supply

## 2018-09-20 ENCOUNTER — Other Ambulatory Visit: Payer: Self-pay

## 2018-09-20 DIAGNOSIS — F988 Other specified behavioral and emotional disorders with onset usually occurring in childhood and adolescence: Secondary | ICD-10-CM

## 2018-09-21 ENCOUNTER — Other Ambulatory Visit: Payer: Self-pay | Admitting: Adult Health

## 2018-09-21 DIAGNOSIS — F988 Other specified behavioral and emotional disorders with onset usually occurring in childhood and adolescence: Secondary | ICD-10-CM

## 2018-09-21 MED ORDER — AMPHETAMINE-DEXTROAMPHETAMINE 20 MG PO TABS: ORAL_TABLET | ORAL | 0 refills | Status: DC

## 2018-09-21 MED FILL — AMPHETAMINE-DEXTROAMPHETAMI: 20 | 28 days supply | Qty: 40 | Fill #0

## 2018-09-23 MED FILL — TRI FEMYNOR 28 TABLET: 0.18/0.215/ | 84 days supply | Qty: 84 | Fill #2

## 2018-09-29 ENCOUNTER — Emergency Department (HOSPITAL_COMMUNITY)
Admission: EM | Admit: 2018-09-29 | Discharge: 2018-09-29 | Disposition: A | Discharge status: Home / Self Care | Payer: 59 | Attending: Emergency Medicine | Admitting: Emergency Medicine

## 2018-09-29 ENCOUNTER — Encounter (HOSPITAL_COMMUNITY): Payer: Self-pay | Admitting: *Deleted

## 2018-09-29 ENCOUNTER — Emergency Department (HOSPITAL_COMMUNITY): Payer: 59

## 2018-09-29 ENCOUNTER — Other Ambulatory Visit: Payer: Self-pay

## 2018-09-29 DIAGNOSIS — Z79899 Other long term (current) drug therapy: Secondary | ICD-10-CM | POA: Insufficient documentation

## 2018-09-29 DIAGNOSIS — R0789 Other chest pain: Secondary | ICD-10-CM

## 2018-09-29 DIAGNOSIS — K219 Gastro-esophageal reflux disease without esophagitis: Secondary | ICD-10-CM | POA: Insufficient documentation

## 2018-09-29 DIAGNOSIS — R079 Chest pain, unspecified: Secondary | ICD-10-CM | POA: Diagnosis not present

## 2018-09-29 LAB — CBC
HCT: 42.5 % (ref 36.0–46.0)
Hemoglobin: 13.9 g/dL (ref 12.0–15.0)
MCH: 29.3 pg (ref 26.0–34.0)
MCHC: 32.7 g/dL (ref 30.0–36.0)
MCV: 89.5 fL (ref 80.0–100.0)
Platelets: 219 10*3/uL (ref 150–400)
RBC: 4.75 MIL/uL (ref 3.87–5.11)
RDW: 13.2 % (ref 11.5–15.5)
WBC: 5 10*3/uL (ref 4.0–10.5)
nRBC: 0 % (ref 0.0–0.2)

## 2018-09-29 LAB — BASIC METABOLIC PANEL
Anion gap: 7 (ref 5–15)
BUN: 21 mg/dL — ABNORMAL HIGH (ref 6–20)
CO2: 25 mmol/L (ref 22–32)
Calcium: 9.3 mg/dL (ref 8.9–10.3)
Chloride: 107 mmol/L (ref 98–111)
Creatinine, Ser: 0.83 mg/dL (ref 0.44–1.00)
GFR calc Af Amer: >60 mL/min (ref >60)
GFR calc non Af Amer: >60 mL/min (ref >60)
Glucose, Bld: 97 mg/dL (ref 70–99)
Potassium: 3.4 mmol/L — ABNORMAL LOW (ref 3.5–5.1)
Sodium: 139 mmol/L (ref 135–145)

## 2018-09-29 LAB — I-STAT BETA HCG BLOOD, ED (MC, WL, AP ONLY): I-stat hCG, quantitative: <5 m[IU]/mL (ref <5)

## 2018-09-29 LAB — D-DIMER, QUANTITATIVE: D-Dimer, Quant: <0.27 ug/mL-FEU (ref 0.00–0.50)

## 2018-09-29 LAB — I-STAT TROPONIN, ED: Troponin i, poc: 0 ng/mL (ref 0.00–0.08)

## 2018-09-29 MED ORDER — ALUM & MAG HYDROXIDE-SIMETH 200-200-20 MG/5ML PO SUSP
30.0000 mL | Freq: Once | ORAL | Status: AC
  Administered 2018-09-29: 30 mL via ORAL
  Filled 2018-09-29: qty 30

## 2018-09-29 MED ORDER — LIDOCAINE VISCOUS HCL 2 % MT SOLN
15.0000 mL | Freq: Once | OROMUCOSAL | Status: AC
  Administered 2018-09-29: 15 mL via ORAL
  Filled 2018-09-29: qty 15

## 2018-09-29 MED ORDER — ESOMEPRAZOLE MAGNESIUM 20 MG PO CPDR: 20.0000 mg | DELAYED_RELEASE_CAPSULE | Freq: Every day | ORAL | 0 refills | Status: DC

## 2018-09-29 MED ORDER — FAMOTIDINE IN NACL 20-0.9 MG/50ML-% IV SOLN
20.0000 mg | Freq: Once | INTRAVENOUS | Status: AC
  Administered 2018-09-29: 20 mg via INTRAVENOUS
  Filled 2018-09-29: qty 50

## 2018-09-29 NOTE — ED Provider Notes (Signed)
[facility] DEPT Provider Note   CSN: [id number] Arrival date & time: 09/29/18  0910    History   Chief Complaint Chief Complaint  Patient presents with  . Chest Pain    HPI Tonya [name] is a 33 y.o. female.     Tonya [name] is a 33 y.o. female with a history of hyperlipidemia, gestational diabetes, anxiety and ADD, who presents to the emergency department for evaluation of chest pain.  She reports for the last 2 weeks she has had constant burning discomfort located on the left side of the chest.  Pain is well localized it does not radiate.  Pain is not exertional.  It is not pleuritic in nature.  She denies associated shortness of breath, lightheadedness or syncope.  No lower extremity swelling or edema.  No associated diaphoresis, nausea, vomiting or abdominal pain.  She does report that she has checked her pulse at work multiple times and it has been elevated from the 90s to the 130s.  She has not had any associated fevers, chills, or cough.  She describes the pain as burning, does not have a history of acid reflux but wonders if this could be contributing.  Patient is a nurse here in the hospital.     Past Medical History:  Diagnosis Date  . ADD (attention deficit disorder)   . Anxiety   . Gestational diabetes   . Hyperlipidemia     Patient Active Problem List   Diagnosis Date Noted  . Obesity (BMI 30.0-34.9) 01/02/2018  . S/P cesarean section 12/03/2016  . Hyperlipidemia   . Anxiety   . ADD (attention deficit disorder)     Past Surgical History:  Procedure Laterality Date  . CESAREAN SECTION N/A 12/03/2016   Procedure: CESAREAN SECTION;  Surgeon: Dian Queen, MD;  Location: [facility];  Service: Obstetrics;  Laterality: N/A;  . WISDOM TOOTH EXTRACTION       OB History    Gravida  1   Para  1   Term  1   Preterm      AB      Living  1     SAB      TAB      Ectopic      Multiple  0   Live  Births  1            Home Medications    Prior to Admission medications   Medication Sig Start Date End Date Taking? Authorizing Provider  amphetamine-dextroamphetamine (ADDERALL) 20 MG tablet Take 1 to 2 tablets daily, not to exceed 5 days a week 09/21/18   [name], NP  Cholecalciferol 5000 units capsule Take 1 capsule (5,000 Units total) by mouth daily. 01/06/18   [name], NP  fenofibrate 160 MG tablet Take 1 tablet (160 mg total) by mouth daily. 01/04/18   [name], NP  Omega 3 1000 MG CAPS Take 1 capsule (1,000 mg total) by mouth daily. 01/06/18   [name], NP    Family History Family History  Problem Relation Age of Onset  . Hypertension Mother   . Depression Mother   . Hypertension Father   . Cancer Father        prostate    Social History Social History   Tobacco Use  . Smoking status: Never Smoker  . Smokeless tobacco: Never Used  Substance Use Topics  . Alcohol use: No  . Drug use: No     Allergies  Patient has no known allergies.   Review of Systems Review of Systems  Constitutional: Negative for chills and fever.  HENT: Negative.   Respiratory: Negative for cough, chest tightness, shortness of breath and wheezing.   Cardiovascular: Positive for chest pain. Negative for palpitations and leg swelling.  Gastrointestinal: Negative for abdominal pain, diarrhea, nausea and vomiting.  Genitourinary: Negative for dysuria, flank pain and frequency.  Musculoskeletal: Negative for arthralgias and myalgias.  Skin: Negative for color change and rash.  Neurological: Negative for dizziness, syncope and light-headedness.     Physical Exam Updated Vital Signs BP 119/88 (BP Location: Left Arm)   Pulse 84   Temp 98.3 F (36.8 C) (Oral)   Resp 18   Ht 5\' 9"  (1.753 m)   Wt 80.3 kg   SpO2 100%   BMI 26.14 kg/m   Physical Exam Vitals signs and nursing note reviewed.  Constitutional:      General: She is not in acute distress.     Appearance: She is well-developed and normal weight. She is not ill-appearing or diaphoretic.  HENT:     Head: Normocephalic and atraumatic.  Eyes:     General:        Right eye: No discharge.        Left eye: No discharge.     Pupils: Pupils are equal, round, and reactive to light.  Neck:     Musculoskeletal: Neck supple.     Trachea: No tracheal deviation.  Cardiovascular:     Rate and Rhythm: Normal rate and regular rhythm.     Pulses:          Radial pulses are 2+ on the right side and 2+ on the left side.       Dorsalis pedis pulses are 2+ on the right side and 2+ on the left side.     Heart sounds: Normal heart sounds. No murmur. No friction rub. No gallop.   Pulmonary:     Effort: Pulmonary effort is normal. No respiratory distress.     Breath sounds: Normal breath sounds. No wheezing or rales.     Comments: Respirations equal and unlabored, patient able to speak in full sentences, lungs clear to auscultation bilaterally Chest:     Chest wall: No tenderness.  Abdominal:     General: Bowel sounds are normal. There is no distension.     Palpations: Abdomen is soft. There is no mass.     Tenderness: There is no abdominal tenderness. There is no guarding.  Musculoskeletal:        General: No deformity.     Right lower leg: She exhibits no tenderness. No edema.     Left lower leg: She exhibits no tenderness. No edema.  Skin:    General: Skin is warm and dry.     Capillary Refill: Capillary refill takes less than 2 seconds.  Neurological:     Mental Status: She is alert and oriented to person, place, and time.     Coordination: Coordination normal.     Comments: Speech is clear, able to follow commands Moves extremities without ataxia, coordination intact      ED Treatments / Results  Labs (all labs ordered are listed, but only abnormal results are displayed) Labs Reviewed  BASIC METABOLIC PANEL - Abnormal; Notable for the following components:      Result Value    Potassium 3.4 (*)    BUN 21 (*)    All other components within normal limits  D-DIMER, QUANTITATIVE (NOT AT [facility])  CBC  I-STAT TROPONIN, ED  I-STAT BETA HCG BLOOD, ED ([facility], [facility], [facility] ONLY)    EKG EKG Interpretation  Date/Time:  "Sunday September 29 2018 09:24:39 EDT Ventricular Rate:  94 PR Interval:    QRS Duration: 85 QT Interval:  350 QTC Calculation: 438 R Axis:   76 Text Interpretation:  Sinus rhythm no prior available for comparison Confirmed by Rees, Elizabeth ([id number]) on 09/29/2018 9:28:04 AM   Radiology Dg Chest 2 View  Result Date: 09/29/2018 CLINICAL DATA:  Chest pain for 2 weeks. EXAM: CHEST - 2 VIEW COMPARISON:  None. FINDINGS: The heart size and mediastinal contours are within normal limits. Both lungs are clear. The visualized skeletal structures are unremarkable. IMPRESSION: Negative.  No active cardiopulmonary disease. Electronically Signed   By: John  Stahl M.D.   On: 09/29/2018 11:07    Procedures Procedures (including critical care time)  Medications Ordered in ED Medications  famotidine (PEPCID) IVPB 20 mg premix (0 mg Intravenous Stopped 09/29/18 1028)  alum & mag hydroxide-simeth (MAALOX/MYLANTA) 200-200-20 MG/5ML suspension 30 mL (30 mLs Oral Given 09/29/18 0956)    And  lidocaine (XYLOCAINE) 2 % viscous mouth solution 15 mL (15 mLs Oral Given 09/29/18 0956)     Initial Impression / Assessment and Plan / ED Course  I have reviewed the triage vital signs and the nursing notes.  Pertinent labs & imaging results that were available during my care of the patient were reviewed by me and considered in my medical decision making (see chart for details).  Patient presents with 2 weeks of left-sided chest pain described as burning, pain has been constant.  Chest pain is not likely of cardiac or pulmonary etiology d/t presentation, given patient low risk for PE,, VSS, no tracheal deviation, no JVD or new murmur, RRR, breath sounds equal bilaterally, EKG without acute  abnormalities, negative troponin, and negative CXR.  Considering that pain has been constant for 2 weeks do not feel that delta troponin is necessary if this were truly ACS would have expected initial troponin to be elevated at this point.  Pain has significantly improved with GI cocktail and Pepcid.  Will start patient on PPI.  Suspect GERD may be contributing.  Patient is to be discharged with recommendation to follow up with PCP in regards to today's hospital visit. Pt has been advised to return to the ED if CP becomes exertional, associated with diaphoresis or nausea, radiates to left jaw/arm, worsens or becomes concerning in any way. Pt appears reliable for follow up and is agreeable to discharge.    Final Clinical Impressions(s) / ED Diagnoses   Final diagnoses:  Atypical chest pain  Gastroesophageal reflux disease, esophagitis presence not specified    ED Discharge Orders         Ordered    esomeprazole (NEXIUM) 20 MG capsule  Daily     04" /05/20 1130           Jacqlyn Larsen, PA-C 09/29/18 1421    Quintella Reichert, MD 09/30/18 [phone]

## 2018-09-29 NOTE — Discharge Instructions (Signed)
Your work-up today is very reassuring, does not suggest an acute problem with your heart or lungs causing your symptoms.  I do think that acid reflux may be contributing to your pain.  Please begin taking Nexium once daily at least 30 minutes before your first meal of the day.  Read the information provided on dietary changes to help with your symptoms.  If symptoms are not improving you will need to follow-up with your primary care doctor for further evaluation.  If you have new or worsening chest pain, shortness of breath, feel lightheaded or like you may pass out or any other new or concerning symptoms please return to the ED for reevaluation.

## 2018-09-29 NOTE — ED Triage Notes (Addendum)
2 weeks of constant chest pain on left side. Noted to be tachy with low rate reported of 90's to 130's. Pt does not know if it is anxiety or not.Describes as burning

## 2018-09-29 NOTE — ED Notes (Signed)
(  Previous charting on completed blood work was done by this Therapist, sports)

## 2018-10-18 ENCOUNTER — Telehealth: Payer: 59 | Admitting: Physician Assistant

## 2018-10-18 DIAGNOSIS — J029 Acute pharyngitis, unspecified: Secondary | ICD-10-CM | POA: Diagnosis not present

## 2018-10-18 MED ORDER — AMOXICILLIN 500 MG PO CAPS: 500.0000 mg | ORAL_CAPSULE | Freq: Two times a day (BID) | ORAL | 0 refills | Status: DC

## 2018-10-18 NOTE — Progress Notes (Signed)
We are sorry that you are not feeling well.  Here is how we plan to help!  Based on what you have shared with me it is likely that you have strep pharyngitis.  Strep pharyngitis is inflammation and infection in the back of the throat.  This is an infection cause by bacteria and is treated with antibiotics.  I have prescribed an antibiotic called amoxicillin. For throat pain, we recommend over the counter oral pain relief medications such as acetaminophen or aspirin, or anti-inflammatory medications such as ibuprofen or naproxen sodium. Topical treatments such as oral throat lozenges or sprays may be used as needed. Strep infections are not as easily transmitted as other respiratory infections, however we still recommend that you avoid close contact with loved ones, especially the very young and elderly.  Remember to wash your hands thoroughly throughout the day as this is the number one way to prevent the spread of infection and wipe down door knobs and counters with disinfectant.   Home Care:  Only take medications as instructed by your medical team.  Complete the entire course of an antibiotic.  Do not take these medications with alcohol.  A steam or ultrasonic humidifier can help congestion.  You can place a towel over your head and breathe in the steam from hot water coming from a faucet.  Avoid close contacts especially the very young and the elderly.  Cover your mouth when you cough or sneeze.  Always remember to wash your hands.  Get Help Right Away If:  You develop worsening fever or sinus pain.  You develop a severe head ache or visual changes.  Your symptoms persist after you have completed your treatment plan.  Make sure you  Understand these instructions.  Will watch your condition.  Will get help right away if you are not doing well or get worse.  Your e-visit answers were reviewed by a board certified advanced clinical practitioner to complete your personal care  plan.  Depending on the condition, your plan could have included both over the counter or prescription medications.  If there is a problem please reply  once you have received a response from your provider.  Your safety is important to [place].  If you have drug allergies check your prescription carefully.    You can use MyChart to ask questions about today's visit, request a non-urgent call back, or ask for a work or school excuse for 24 hours related to this e-Visit. If it has been greater than 24 hours you will need to follow up with your provider, or enter a new e-Visit to address those concerns.  You will get an e-mail in the next two days asking about your experience.  I hope that your e-visit has been valuable and will speed your recovery. Thank you for using e-visits.  I have spent 5 minutes in review of e-visit questionnaire, review and updating patient chart, medical decision making and response to patient.    Tenna Delaine, PA-C

## 2018-10-21 ENCOUNTER — Other Ambulatory Visit: Payer: Self-pay

## 2018-10-21 DIAGNOSIS — F988 Other specified behavioral and emotional disorders with onset usually occurring in childhood and adolescence: Secondary | ICD-10-CM

## 2018-10-21 MED ORDER — AMPHETAMINE-DEXTROAMPHETAMINE 20 MG PO TABS: ORAL_TABLET | ORAL | 0 refills | Status: DC

## 2018-10-21 MED FILL — AMPHETAMINE-DEXTROAMPHETAMI: 20 | 28 days supply | Qty: 40 | Fill #0

## 2018-11-20 ENCOUNTER — Other Ambulatory Visit: Payer: Self-pay

## 2018-11-20 DIAGNOSIS — F988 Other specified behavioral and emotional disorders with onset usually occurring in childhood and adolescence: Secondary | ICD-10-CM

## 2018-11-20 MED ORDER — AMPHETAMINE-DEXTROAMPHETAMINE 20 MG PO TABS: ORAL_TABLET | ORAL | 0 refills | Status: DC

## 2018-11-20 MED FILL — AMPHETAMINE-DEXTROAMPHETAMI: 20 | 30 days supply | Qty: 40 | Fill #0

## 2018-12-19 ENCOUNTER — Other Ambulatory Visit: Payer: Self-pay

## 2018-12-19 DIAGNOSIS — F988 Other specified behavioral and emotional disorders with onset usually occurring in childhood and adolescence: Secondary | ICD-10-CM

## 2018-12-19 MED ORDER — AMPHETAMINE-DEXTROAMPHETAMINE 20 MG PO TABS: ORAL_TABLET | ORAL | 0 refills | Status: DC

## 2018-12-19 MED FILL — AMPHETAMINE-DEXTROAMPHETAMI: 20 | 56 days supply | Qty: 40 | Fill #0

## 2018-12-19 MED FILL — TRI FEMYNOR 28 TABLET: 0.18/0.215/ | 84 days supply | Qty: 84 | Fill #3

## 2019-01-17 ENCOUNTER — Other Ambulatory Visit: Payer: Self-pay

## 2019-01-17 DIAGNOSIS — F988 Other specified behavioral and emotional disorders with onset usually occurring in childhood and adolescence: Secondary | ICD-10-CM

## 2019-01-17 MED ORDER — AMPHETAMINE-DEXTROAMPHETAMINE 20 MG PO TABS: ORAL_TABLET | ORAL | 0 refills | Status: DC

## 2019-01-17 MED FILL — AMPHETAMINE-DEXTROAMPHETAMI: 20 | 28 days supply | Qty: 40 | Fill #0

## 2019-02-17 ENCOUNTER — Other Ambulatory Visit: Payer: Self-pay

## 2019-02-17 DIAGNOSIS — F988 Other specified behavioral and emotional disorders with onset usually occurring in childhood and adolescence: Secondary | ICD-10-CM

## 2019-02-17 MED ORDER — AMPHETAMINE-DEXTROAMPHETAMINE 20 MG PO TABS: ORAL_TABLET | ORAL | 0 refills | Status: DC

## 2019-02-17 MED FILL — AMPHETAMINE-DEXTROAMPHETAMI: 20 | 28 days supply | Qty: 40 | Fill #0

## 2019-03-04 DIAGNOSIS — L988 Other specified disorders of the skin and subcutaneous tissue: Secondary | ICD-10-CM | POA: Diagnosis not present

## 2019-03-04 DIAGNOSIS — L709 Acne, unspecified: Secondary | ICD-10-CM | POA: Diagnosis not present

## 2019-03-04 DIAGNOSIS — N6459 Other signs and symptoms in breast: Secondary | ICD-10-CM | POA: Diagnosis not present

## 2019-03-04 MED FILL — CLINDAMYCIN PHOS-BENZOYL PE: 1-5 | 15 days supply | Qty: 25 | Fill #0

## 2019-03-04 MED FILL — AMOX-CLAV 875-125 MG TABLET: 875-125 | 7 days supply | Qty: 14 | Fill #0

## 2019-03-18 ENCOUNTER — Other Ambulatory Visit: Payer: Self-pay

## 2019-03-18 DIAGNOSIS — F988 Other specified behavioral and emotional disorders with onset usually occurring in childhood and adolescence: Secondary | ICD-10-CM

## 2019-03-20 MED ORDER — AMPHETAMINE-DEXTROAMPHETAMINE 20 MG PO TABS: ORAL_TABLET | ORAL | 0 refills | Status: DC

## 2019-03-20 MED FILL — NORGESTIM-ETH ESTRAD TRIPHA: 0.18/0.215/ | 84 days supply | Qty: 84 | Fill #0

## 2019-03-20 MED FILL — AMPHETAMINE-DEXTROAMPHETAMI: 20 | 28 days supply | Qty: 40 | Fill #0

## 2019-04-03 DIAGNOSIS — Z20828 Contact with and (suspected) exposure to other viral communicable diseases: Secondary | ICD-10-CM | POA: Diagnosis not present

## 2019-04-07 DIAGNOSIS — R300 Dysuria: Secondary | ICD-10-CM

## 2019-04-07 MED ORDER — SULFAMETHOXAZOLE-TRIMETHOPRIM 800-160 MG PO TABS: 1.0000 | ORAL_TABLET | Freq: Two times a day (BID) | ORAL | 0 refills | Status: DC

## 2019-04-07 MED ORDER — FLUCONAZOLE 150 MG PO TABS: 150.0000 mg | ORAL_TABLET | Freq: Once | ORAL | 3 refills | Status: AC

## 2019-04-07 NOTE — Telephone Encounter (Signed)
Virtual Visit via Telephone Note  I connected with Tonya [name] on @TODAY @ at  by telephone and verified that I am speaking with the correct person using two identifiers.  Location: Patient: home Provider: [facility] office    I discussed the limitations, risks, security and privacy concerns of performing an evaluation and management service by telephone and the availability of in person appointments. I also discussed with the patient that there may be a patient responsible charge related to this service. The patient expressed understanding and agreed to proceed.   History of Present Illness:  33 y.o. female reports 1-2 days of dysuria and sense of lower abdominal distention without decreased urine output, foul urine odor, frequency. Denies hematuria, urgency, cloudiness. Reports feels identical to last UTI in 2018. Denies recent abx use. Denies vaginal discharge, but does tend to get yeast infections after abx use. She is currently under quarantine due to possible covid exposure at work.   Allergies: No Known Allergies Medical History:  has Anxiety; ADD (attention deficit disorder); Hyperlipidemia; S/P cesarean section; and Obesity (BMI 30.0-34.9) on their problem list. Surgical History:  She  has a past surgical history that includes Wisdom tooth extraction and Cesarean section (N/A, 12/03/2016). Family History:  Herfamily history includes Cancer in her father; Depression in her mother; Hypertension in her father and mother. Social History:   reports that she has never smoked. She has never used smokeless tobacco. She reports that she does not drink alcohol or use drugs.   Observations/Objective:  General : Well sounding patient in no apparent distress HEENT: no hoarseness, no cough for duration of visit Lungs: speaks in complete sentences, no audible wheezing, no apparent distress Neurological: alert, oriented x 3 Psychiatric: pleasant, judgement appropriate    Assessment and  Plan:  Diagnoses and all orders for this visit:  Dysuria Presumptive UTI tx without culture due to current quarantine due to possible covid exposure;  Classic sx;  Hygiene reviewed; push fluid intake; should improve sx in 12-24 hours after initiating abx Advised to present to ED if fever/chills, decreased urine output, frank hematuria; call back if any other concerns in the interim -     sulfamethoxazole-trimethoprim (BACTRIM DS) 800-160 MG tablet; Take 1 tablet by mouth 2 (two) times daily.  Other orders -     fluconazole (DIFLUCAN) 150 MG tablet; Take 1 tablet (150 mg total) by mouth once for 1 dose.   Follow Up Instructions:    I discussed the assessment and treatment plan with the patient. The patient was provided an opportunity to ask questions and all were answered. The patient agreed with the plan and demonstrated an understanding of the instructions.   The patient was advised to call back or seek an in-person evaluation if the symptoms worsen or if the condition fails to improve as anticipated.  I provided 15 minutes of non-face-to-face time during this encounter.   [name], NP

## 2019-04-08 MED FILL — SULFAMETHOXAZOLE-TMP DS TAB: 800-160 | 5 days supply | Qty: 10 | Fill #0

## 2019-04-08 MED FILL — FLUCONAZOLE 150 MG TABS: 150 | 1 days supply | Qty: 1 | Fill #0

## 2019-04-17 ENCOUNTER — Other Ambulatory Visit: Payer: Self-pay

## 2019-04-17 DIAGNOSIS — F988 Other specified behavioral and emotional disorders with onset usually occurring in childhood and adolescence: Secondary | ICD-10-CM

## 2019-04-17 MED ORDER — AMPHETAMINE-DEXTROAMPHETAMINE 20 MG PO TABS: ORAL_TABLET | ORAL | 0 refills | Status: DC

## 2019-04-17 MED FILL — AMPHETAMINE-DEXTROAMPHETAMI: 20 | 28 days supply | Qty: 40 | Fill #0

## 2019-05-13 DIAGNOSIS — Z01419 Encounter for gynecological examination (general) (routine) without abnormal findings: Secondary | ICD-10-CM | POA: Diagnosis not present

## 2019-05-13 DIAGNOSIS — Z6831 Body mass index (BMI) 31.0-31.9, adult: Secondary | ICD-10-CM | POA: Diagnosis not present

## 2019-05-19 ENCOUNTER — Other Ambulatory Visit: Payer: Self-pay

## 2019-05-19 DIAGNOSIS — F988 Other specified behavioral and emotional disorders with onset usually occurring in childhood and adolescence: Secondary | ICD-10-CM

## 2019-05-19 MED ORDER — AMPHETAMINE-DEXTROAMPHETAMINE 20 MG PO TABS: ORAL_TABLET | ORAL | 0 refills | Status: DC

## 2019-05-20 MED FILL — DEXTROAMP-AMPHETAMIN 20 MG: 20 | 28 days supply | Qty: 40 | Fill #0

## 2019-06-04 ENCOUNTER — Encounter: Payer: Self-pay | Admitting: Adult Health

## 2019-06-11 DIAGNOSIS — M546 Pain in thoracic spine: Secondary | ICD-10-CM | POA: Diagnosis not present

## 2019-06-11 DIAGNOSIS — M545 Low back pain: Secondary | ICD-10-CM | POA: Diagnosis not present

## 2019-06-11 DIAGNOSIS — M25561 Pain in right knee: Secondary | ICD-10-CM | POA: Diagnosis not present

## 2019-06-11 MED FILL — diazePAM 2 MG TABS: 2 | 1 days supply | Qty: 2 | Fill #0

## 2019-06-12 ENCOUNTER — Other Ambulatory Visit (HOSPITAL_COMMUNITY): Payer: Self-pay | Admitting: Orthopaedic Surgery

## 2019-06-12 ENCOUNTER — Other Ambulatory Visit: Payer: Self-pay | Admitting: Orthopaedic Surgery

## 2019-06-12 DIAGNOSIS — N6459 Other signs and symptoms in breast: Secondary | ICD-10-CM | POA: Diagnosis not present

## 2019-06-12 DIAGNOSIS — L739 Follicular disorder, unspecified: Secondary | ICD-10-CM | POA: Diagnosis not present

## 2019-06-12 DIAGNOSIS — M545 Low back pain, unspecified: Secondary | ICD-10-CM

## 2019-06-12 DIAGNOSIS — Z3202 Encounter for pregnancy test, result negative: Secondary | ICD-10-CM | POA: Diagnosis not present

## 2019-06-12 DIAGNOSIS — L918 Other hypertrophic disorders of the skin: Secondary | ICD-10-CM | POA: Diagnosis not present

## 2019-06-12 DIAGNOSIS — R229 Localized swelling, mass and lump, unspecified: Secondary | ICD-10-CM | POA: Diagnosis not present

## 2019-06-12 DIAGNOSIS — M546 Pain in thoracic spine: Secondary | ICD-10-CM

## 2019-06-12 DIAGNOSIS — N912 Amenorrhea, unspecified: Secondary | ICD-10-CM | POA: Diagnosis not present

## 2019-06-18 ENCOUNTER — Encounter: Payer: Self-pay | Admitting: Adult Health

## 2019-06-18 ENCOUNTER — Other Ambulatory Visit: Payer: Self-pay

## 2019-06-18 ENCOUNTER — Ambulatory Visit (INDEPENDENT_AMBULATORY_CARE_PROVIDER_SITE_OTHER): Payer: 59 | Admitting: Adult Health

## 2019-06-18 VITALS — BP 128/86 | HR 99 | Temp 97.5°F | Ht 65.0 in | Wt 191.6 lb

## 2019-06-18 DIAGNOSIS — E66811 Obesity, class 1: Secondary | ICD-10-CM

## 2019-06-18 DIAGNOSIS — N910 Primary amenorrhea: Secondary | ICD-10-CM

## 2019-06-18 DIAGNOSIS — E559 Vitamin D deficiency, unspecified: Secondary | ICD-10-CM

## 2019-06-18 DIAGNOSIS — E782 Mixed hyperlipidemia: Secondary | ICD-10-CM

## 2019-06-18 DIAGNOSIS — E669 Obesity, unspecified: Secondary | ICD-10-CM

## 2019-06-18 DIAGNOSIS — Z0001 Encounter for general adult medical examination with abnormal findings: Secondary | ICD-10-CM

## 2019-06-18 DIAGNOSIS — Z3201 Encounter for pregnancy test, result positive: Secondary | ICD-10-CM

## 2019-06-18 DIAGNOSIS — Z1329 Encounter for screening for other suspected endocrine disorder: Secondary | ICD-10-CM

## 2019-06-18 DIAGNOSIS — Z0000 Encounter for general adult medical examination without abnormal findings: Secondary | ICD-10-CM | POA: Diagnosis not present

## 2019-06-18 DIAGNOSIS — F988 Other specified behavioral and emotional disorders with onset usually occurring in childhood and adolescence: Secondary | ICD-10-CM

## 2019-06-18 DIAGNOSIS — Z79899 Other long term (current) drug therapy: Secondary | ICD-10-CM

## 2019-06-18 DIAGNOSIS — F419 Anxiety disorder, unspecified: Secondary | ICD-10-CM

## 2019-06-18 MED ORDER — CHOLECALCIFEROL 125 MCG (5000 UT) PO CAPS: 5000.0000 [IU] | ORAL_CAPSULE | Freq: Every day | ORAL | 1 refills | Status: DC

## 2019-06-18 MED FILL — VITAMIN D 125 MCG (5000 UT): 125 MCG | 90 days supply | Qty: 90 | Fill #0

## 2019-06-18 NOTE — Patient Instructions (Addendum)
[name] , Thank you for taking time to come for your Annual Wellness Visit. I appreciate your ongoing commitment to your health goals. Please review the following plan we discussed and let me know if I can assist you in the future.   These are the goals we discussed: Goals    . Exercise 150 min/wk Moderate Activity    . Weight (lb) < 170 lb (77.1 kg)       This is a list of the screening recommended for you and due dates:  Health Maintenance  Topic Date Due  . HIV Screening  05/25/2001  . Flu Shot  01/25/2019  . Pap Smear  11/28/2019  . Tetanus Vaccine  02/24/2025    Look up the next 56 days . Com    Know what a healthy weight is for you (roughly BMI <25) and aim to maintain this  Aim for 7+ servings of fruits and vegetables daily  65-80+ fluid ounces of water or unsweet tea for healthy kidneys  Limit to max 1 drink of alcohol per day; avoid smoking/tobacco  Limit animal fats in diet for cholesterol and heart health - choose grass fed whenever available  Avoid highly processed foods, and foods high in saturated/trans fats  Aim for low stress - take time to unwind and care for your mental health  Aim for 150 min of moderate intensity exercise weekly for heart health, and weights twice weekly for bone health  Aim for 7-9 hours of sleep daily     Drink 1/2 your body weight in fluid ounces of water daily; drink a tall glass of water 30 min before meals  Don't eat until you're stuffed- listen to your stomach and eat until you are 80% full   Try eating off of a salad plate; wait 10 min after finishing before going back for seconds  Start by eating the vegetables on your plate; aim for 50% of your meals to be fruits or vegetables  Then eat your protein - lean meats (grass fed if possible), fish, beans, nuts in moderation  Eat your carbs/starch last ONLY if you still are hungry. If you can, stop before finishing it all  Avoid sugar and flour - the closer  it looks to it's original form in nature, typically the better it is for you  Splurge in moderation - "assign" days when you get to splurge and have the "bad stuff" - I like to follow a 80% - 20% plan- "good" choices 80 % of the time, "bad" choices in moderation 20% of the time  Simple equation is: Calories out > calories in = weight loss - even if you eat the bad stuff, if you limit portions, you will still lose weight     Who Qualifies for Obesity Medications? Although everyone is hopeful for a fast and easy way to lose weight, nothing has been shown to replace a prudent, calorie-controlled diet along with behavior modification as a cornerstone for all obesity treatments.   The next tool that can be used to achieve weight-loss and health improvement is medication.   Pharmacotherapy may be offered to Individuals affected by obesity who have failed to achieve weight-loss through diet and exercise alone.  Currently there are several drugs that are approved by the FDA for weight-loss: . phentermine products (Adipex-P or Suprenza)  . phentermine- topiramate ER (Qsymia)  . Bupropion; Naltrexone ER (Contrave)  . Saxenda (liraglutide), once a day weight loss shot .  Let's take a closer look at each of these medications and learn how they work:   Phentermine-Topiramate ER (Qsymia) How does it work? This combination medication was approved by the FDA in July 2012. Topiramate is a medication used to treat seizures. It was found that a common side effect of this medication was weight-loss. Phentermine, as described in this brochure, helps to increase your energy and decrease your appetite.  Concerns: The most common side effects were dry mouth, constipation and pins-and-needle feeling in extremities.  Qsymia should NOT be taken during pregnancy since Topiramate ER, a component of Qsymia, has been associated with an increased risk of birth defects.  MEDIATIONS SEPARATED: PHENTERMINE How  does it work? Phentermine is a medication available by prescription that works on chemicals in the brain to decrease your appetite. It also has a mild stimulant component that adds extra energy. Phentermine is a pill that is taken once a day in the morning time.   Tolerance to this medication normally develops, so it can only be used for several months at a time.  Common side effects are dry mouth, sleeplessness, constipation.  Concerns: Due to its stimulant effect, a person's blood pressure and heart rate may increase when on this medication; therefore, you must be monitored closely by a physician who is experienced in prescribing this medication. It cannot be used in patients with some heart conditions (such as poorly controlled blood pressure), glaucoma (increased pressure in your eye), stroke or overactive thyroid. There is some concern for abuse, but this is minimal if the medication is appropriately used as directed by a healthcare professional.  TOPIRAMATE Sometimes we will prescribe topiramate AND phentermine together to make Qsymia- separating the medications makes it cheaper.  How to start it start on 1/2 pill for 3-5 nights, can increase to a whole pill for 1-2 weeks.  This medication is good for weight loss, headaches, pain This medication can cause numbness, tingling and can cause brain fog- stop if you get these Check with your eye doctor if you have a history of glaucoma and stop if you severe vision changes or blurry vision.    Bupropion; Naltrexone ER (Contrave) How does it work? Works in two areas of your brain, hunger center and reward center to reduce hunger and cravings.   Concerns Most common side effects are dry mouth, constipation or diarrhea, headache.  Please take it with a full glass of water and low fat meal.   MEDICATIONS SEPARATED: WELLBUTRIN Only antidepressant that helps with weight loss Used frequently for seasonal effective disorder If you get nausea and  HA after the first week please stop- can cause people to clench their jaw- stop it.  If you get anxious or snappy with people than stop the medication But this medication can help with energy, weight loss and mood It kicks in about 1-2 weeks And can be stopped quickly  NALTREXONE You can NOT take this medication if you are on an opioid.  It is used to treat patients that have cravings/addiction to alcohol/opioids.  Taken with supper/dinner.   SAXENDA Is an expensive once a day injectable that helps decrease appetite.  You usually have to fail other therapies and show 6 months of trying before we can get it approved.  Can cause nausea.   VYVANSE Is a stimulant that is approved for binge eating disorder.    Follow-up Visits: Patients are given the opportunity to revisit a topic or obtain more information on an area of interest  during follow-up visits.  The frequency of and interval between follow-up visits is determined on a patient-by-patient basis.   Frequent visits (every 3 to 4 weeks) are encouraged until initial weight-loss goals (5 to 10 percent of body weight) are achieved.   At that point, less frequent visits are typically scheduled as needed for individual patients. However, since obesity is considered a chronic life-long problem for many individuals, periodic continual follow up is recommended.  Research has shown that weight-loss as low as 5 percent of initial body weight can lead to favorable improvements in blood pressure, cholesterol, glucose levels and insulin sensitivity. The risk of developing heart disease is reduced the most in patients who have impaired glucose tolerance, type 2 diabetes or high blood pressure.

## 2019-06-18 NOTE — Progress Notes (Signed)
Complete Physical  Assessment and Plan:  Tonya [name] was seen today for annual exam.  Diagnoses and all orders for this visit:  Routine general medical examination at a health care facility  Anxiety Previously treated by prozac - was seeing psych - considering going back Declines medications today  Stress management techniques discussed, increase water, good sleep hygiene discussed, increase exercise, and increase veggies.   Attention deficit disorder (ADD) without hyperactivity Continue medications; will stop if suspicion for pregnancy  Helps with focus, no AE's. The patient was counseled on the addictive nature of the medication and was encouraged to take drug holidays when not needed.   Mixed hyperlipidemia Not currently on treatment - Continue low cholesterol diet and exercise.  Check lipid panel. -     Lipid panel  Screening for diabetes mellitus -     CMP/GFR - declines A1C today   Screening for thyroid disorder -     TSH  Vitamin D deficiency -     VITAMIN D 25 Hydroxy (Vit-D Deficiency, Fractures)  Medication management -     CBC with Differential/Platelet -     CMP/GFR  Delayed menses/ positive UPT Check serum hcg  Discussed med's effects and SE's. Screening labs and tests as requested with regular follow-up as recommended. Over 40 minutes of exam, counseling, chart review, and complex, high level critical decision making was performed this visit.   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   She is married, works on Building services engineer as  Therapist, sports. 1 child, 2.5 y/o boy.   Follows with GYN for PAP. Reports had urine test there and was normal.   She is getting a lumbar MRI through ortho Dr. Norm Salt, having knee pain, xrays were normal, also with sciatica, worse after bending activities, intermittent.   Is off of birth control, interested in a child next year, trying to lose weight first, has been using condoms but cycle is 1 week late. Has had neg pregnancy test x 2, had 3rd which she reports was "weak positive."   BMI is Body mass index is 31.88 kg/m., she has been working on diet and exercise. Walks with her child, admits diet is not good right now.  Wt Readings from Last 3 Encounters:  06/18/19 191 lb 9.6 oz (86.9 kg)  09/29/18 177 lb 0.5 oz (80.3 kg)  06/11/18 177 lb (80.3 kg)   Today their BP is BP: 128/86 She does workout. She denies chest pain, shortness of breath, dizziness.   She is not on cholesterol medication and denies myalgias. Her cholesterol is not at goal. The cholesterol last visit was:   Lab Results  Component Value Date   CHOL 184 06/11/2018   HDL 56 06/11/2018   LDLCALC 112 (H) 06/11/2018   TRIG 75 06/11/2018   CHOLHDL 3.3 06/11/2018   She has a hx of gestaional diabetes;  Last A1C in the office was:  Lab Results  Component Value Date   HGBA1C 5.1 06/11/2018   Last GFR: Lab Results  Component Value Date   GFRNONAA >60 09/29/2018  Patient is not on Vitamin D supplement and was below goal at last check:    Lab Results  Component Value Date   VD25OH 38 06/11/2018      Current Medications:  Current Outpatient Medications on File Prior to Visit  Medication Sig Dispense Refill  . amphetamine-dextroamphetamine (ADDERALL) 20 MG tablet Take 1 to 2 tablets daily, not to exceed 5 days a week 40 tablet 0  . acetaminophen (TYLENOL) 325 MG tablet Take 650 mg by mouth every 6 (six) hours as needed for mild pain or moderate pain.    Marland Kitchen esomeprazole (NEXIUM) 20 MG capsule Take 1 capsule (20 mg total) by  mouth daily. (Patient not taking: Reported on 06/18/2019) 42 capsule 0  . TRI FEMYNOR 0.18/0.215/0.25 MG-35 MCG tablet Take 1 tablet by mouth daily.      No current facility-administered medications on file prior to visit.   Allergies:  No Known Allergies Medical History:  She has Anxiety; ADD (attention deficit disorder); Hyperlipidemia; S/P cesarean section; and Obesity (BMI 30.0-34.9) on their problem list. Health Maintenance:   Immunization History  Administered Date(s) Administered  . Influenza Split 04/17/2013  . Tdap 02/24/2015   Tetanus: 2016 Flu vaccine: 2020 at work   LMP: Patient's last menstrual period was 05/13/2019. Pap: 2018 - at GYN, has pelvic annually  MGM: n/a  Colonoscopy: n/a EGD: n/a  Last Dental Exam: 2020 - q6 months Last Eye Exam: 2020, goes annually Last Derm: 2018    Patient Care Team: Unk Pinto, MD as PCP - General (Internal Medicine) Vernell Morgans, MD as Referring Physician (Obstetrics and Gynecology)  Surgical History:  She has a past surgical history that includes Wisdom tooth extraction and Cesarean section (N/A, 12/03/2016). Family History:  Herfamily history includes Cancer in her father; Depression in her mother; Hypertension in her father and mother. Social History:  She reports that she has never smoked. She has never used smokeless tobacco. She reports that she does not drink alcohol or use drugs.  Review of Systems: Review of Systems  Constitutional: Negative for malaise/fatigue and weight loss.  HENT: Negative for hearing loss and tinnitus.   Eyes: Negative for blurred vision and double vision.  Respiratory: Negative for cough, shortness of breath and wheezing.   Cardiovascular: Negative for chest pain, palpitations, orthopnea, claudication and leg swelling.  Gastrointestinal: Negative for abdominal pain, blood in stool, constipation, diarrhea, heartburn, melena, nausea and vomiting.  Genitourinary: Negative.    Musculoskeletal: Negative for joint pain and myalgias.  Skin: Negative for rash.  Neurological: Negative for dizziness, tingling, sensory change, weakness and headaches.  Endo/Heme/Allergies: Negative for polydipsia.  Psychiatric/Behavioral: Negative for depression, substance abuse and suicidal ideas. The patient is nervous/anxious. The patient does not have insomnia.   All other systems reviewed and are negative.   Physical Exam: Estimated body mass index is 31.88 kg/m as calculated from the following:   Height as of this encounter: 5\' 5"  (1.651 m).   Weight as of this encounter: 191 lb 9.6 oz (86.9 kg). BP 128/86   Pulse 99   Temp (!) 97.5 F (36.4 C)   Ht 5\' 5"  (1.651 m)   Wt 191 lb 9.6 oz (86.9 kg)   LMP 05/13/2019   SpO2 98%   BMI 31.88 kg/m  General Appearance: Well nourished, in no apparent distress.  Eyes: PERRLA, EOMs, conjunctiva no swelling or erythema Sinuses: No Frontal/maxillary tenderness  ENT/Mouth: Ext aud canals clear, normal light reflex with TMs without erythema, bulging. Good dentition. No erythema,  swelling, or exudate on post pharynx. Tonsils not swollen or erythematous. Hearing normal.  Neck: Supple, thyroid normal. No bruits  Respiratory: Respiratory effort normal, BS equal bilaterally without rales, rhonchi, wheezing or stridor.  Cardio: RRR without murmurs, rubs or gallops. Brisk peripheral pulses without edema.  Chest: symmetric, with normal excursions and percussion.  Breasts: Defer to GYN Abdomen: Soft, nontender, no guarding, rebound, hernias, masses, or organomegaly.  Lymphatics: Non tender without lymphadenopathy.  Genitourinary: Defer to GYN Musculoskeletal: Full ROM all peripheral extremities,5/5 strength, and normal gait.  Skin: Warm, dry without rashes, lesions, ecchymosis. Some scattered pustular acne to chin.  Neuro: Cranial nerves intact, reflexes equal bilaterally. Normal muscle tone, no cerebellar symptoms. Sensation intact.  Psych:  Awake and oriented X 3, normal affect, Insight and Judgment appropriate.   EKG: Normal EKG 09/2018 reviewed - no new concerns, defer  [name] 10:21 AM [facility]

## 2019-06-19 ENCOUNTER — Other Ambulatory Visit: Payer: Self-pay

## 2019-06-19 ENCOUNTER — Telehealth: Payer: 59 | Admitting: Physician Assistant

## 2019-06-19 DIAGNOSIS — F988 Other specified behavioral and emotional disorders with onset usually occurring in childhood and adolescence: Secondary | ICD-10-CM

## 2019-06-19 DIAGNOSIS — L03313 Cellulitis of chest wall: Secondary | ICD-10-CM

## 2019-06-19 LAB — CBC WITH DIFFERENTIAL/PLATELET
Absolute Monocytes: 308 cells/uL (ref 200–950)
Basophils Absolute: 61 cells/uL (ref 0–200)
Basophils Relative: 1.1 %
Eosinophils Absolute: 138 cells/uL (ref 15–500)
Eosinophils Relative: 2.5 %
HCT: 42.6 % (ref 35.0–45.0)
Hemoglobin: 14.1 g/dL (ref 11.7–15.5)
Lymphs Abs: 1568 cells/uL (ref 850–3900)
MCH: 29.3 pg (ref 27.0–33.0)
MCHC: 33.1 g/dL (ref 32.0–36.0)
MCV: 88.4 fL (ref 80.0–100.0)
MPV: 10.2 fL (ref 7.5–12.5)
Monocytes Relative: 5.6 %
Neutro Abs: 3427 cells/uL (ref 1500–7800)
Neutrophils Relative %: 62.3 %
Platelets: 200 10*3/uL (ref 140–400)
RBC: 4.82 10*6/uL (ref 3.80–5.10)
RDW: 12.2 % (ref 11.0–15.0)
Total Lymphocyte: 28.5 %
WBC: 5.5 10*3/uL (ref 3.8–10.8)

## 2019-06-19 LAB — LIPID PANEL
Cholesterol: 194 mg/dL (ref <200)
HDL: 46 mg/dL — ABNORMAL LOW (ref >=50)
LDL Cholesterol (Calc): 127 mg/dL (calc) — ABNORMAL HIGH
Non-HDL Cholesterol (Calc): 148 mg/dL (calc) — ABNORMAL HIGH (ref <130)
Total CHOL/HDL Ratio: 4.2 (calc) (ref <5.0)
Triglycerides: 106 mg/dL (ref <150)

## 2019-06-19 LAB — COMPLETE METABOLIC PANEL WITH GFR
AG Ratio: 1.9 (calc) (ref 1.0–2.5)
ALT: 37 U/L — ABNORMAL HIGH (ref 6–29)
AST: 19 U/L (ref 10–30)
Albumin: 4.5 g/dL (ref 3.6–5.1)
Alkaline phosphatase (APISO): 59 U/L (ref 31–125)
BUN: 16 mg/dL (ref 7–25)
CO2: 25 mmol/L (ref 20–32)
Calcium: 9.5 mg/dL (ref 8.6–10.2)
Chloride: 103 mmol/L (ref 98–110)
Creat: 0.82 mg/dL (ref 0.50–1.10)
GFR, Est African American: 109 mL/min/{1.73_m2} (ref >=60)
GFR, Est Non African American: 94 mL/min/{1.73_m2} (ref >=60)
Globulin: 2.4 g/dL (calc) (ref 1.9–3.7)
Glucose, Bld: 92 mg/dL (ref 65–99)
Potassium: 4.3 mmol/L (ref 3.5–5.3)
Sodium: 138 mmol/L (ref 135–146)
Total Bilirubin: 0.7 mg/dL (ref 0.2–1.2)
Total Protein: 6.9 g/dL (ref 6.1–8.1)

## 2019-06-19 LAB — HCG, SERUM, QUALITATIVE: Preg, Serum: NEGATIVE

## 2019-06-19 LAB — TSH: TSH: 0.94 mIU/L

## 2019-06-19 LAB — MAGNESIUM: Magnesium: 2 mg/dL (ref 1.5–2.5)

## 2019-06-19 LAB — VITAMIN D 25 HYDROXY (VIT D DEFICIENCY, FRACTURES): Vit D, 25-Hydroxy: 34 ng/mL (ref 30–100)

## 2019-06-19 MED ORDER — CLINDAMYCIN HCL 300 MG PO CAPS: 300.0000 mg | ORAL_CAPSULE | Freq: Three times a day (TID) | ORAL | 0 refills | Status: DC

## 2019-06-19 MED ORDER — AMPHETAMINE-DEXTROAMPHETAMINE 20 MG PO TABS: ORAL_TABLET | ORAL | 0 refills | Status: DC

## 2019-06-19 NOTE — Progress Notes (Signed)
E Visit for Cellulitis  We are sorry that you are not feeling well. Here is how we plan to help!  Based on what you shared with me it looks like you have cellulitis.  Cellulitis looks like areas of skin redness, swelling, and warmth; it develops as a result of bacteria entering under the skin. Little red spots and/or bleeding can be seen in skin, and tiny surface sacs containing fluid can occur. Fever can be present. Cellulitis is almost always on one side of a body, and the lower limbs are the most common site of involvement.   I have prescribed:  Clindamycin 450 mg take one by mouth three times a day for 5 days  HOME CARE:  . Take your medications as ordered and take all of them, even if the skin irritation appears to be healing.   GET HELP RIGHT AWAY IF:  . Symptoms that don't begin to go away within 48 hours. . Severe redness persists or worsens . If the area turns color, spreads or swells. . If it blisters and opens, develops yellow-brown crust or bleeds. . You develop a fever or chills. . If the pain increases or becomes unbearable.  . Are unable to keep fluids and food down.  MAKE SURE YOU    Understand these instructions.  Will watch your condition.  Will get help right away if you are not doing well or get worse.  Thank you for choosing an e-visit. Your e-visit answers were reviewed by a board certified advanced clinical practitioner to complete your personal care plan. Depending upon the condition, your plan could have included both over the counter or prescription medications. Please review your pharmacy choice. Make sure the pharmacy is open so you can pick up prescription now. If there is a problem, you may contact your provider through [employer] and have the prescription routed to another pharmacy. Your safety is important to [place]. If you have drug allergies check your prescription carefully.  For the next 24 hours you can use MyChart to ask questions about  today's visit, request a non-urgent call back, or ask for a work or school excuse. You will get an email in the next two days asking about your experience. I hope that your e-visit has been valuable and will speed your recovery.

## 2019-06-24 ENCOUNTER — Encounter (HOSPITAL_COMMUNITY): Payer: Self-pay

## 2019-06-24 ENCOUNTER — Ambulatory Visit (HOSPITAL_COMMUNITY): Payer: 59

## 2019-06-26 DIAGNOSIS — Z135 Encounter for screening for eye and ear disorders: Secondary | ICD-10-CM | POA: Diagnosis not present

## 2019-06-26 DIAGNOSIS — H5213 Myopia, bilateral: Secondary | ICD-10-CM | POA: Diagnosis not present

## 2019-06-26 DIAGNOSIS — H52222 Regular astigmatism, left eye: Secondary | ICD-10-CM | POA: Diagnosis not present

## 2019-07-19 ENCOUNTER — Other Ambulatory Visit: Payer: Self-pay | Admitting: Adult Health

## 2019-07-19 MED ORDER — AMOXICILLIN 500 MG PO TABS: 500.0000 mg | ORAL_TABLET | Freq: Three times a day (TID) | ORAL | 0 refills | Status: DC

## 2019-07-19 MED FILL — AMOXICILLIN 500 MG CAPSULE: 500 | 10 days supply | Qty: 30 | Fill #0

## 2019-07-20 ENCOUNTER — Other Ambulatory Visit: Payer: Self-pay

## 2019-07-20 DIAGNOSIS — F988 Other specified behavioral and emotional disorders with onset usually occurring in childhood and adolescence: Secondary | ICD-10-CM

## 2019-07-21 MED ORDER — AMPHETAMINE-DEXTROAMPHETAMINE 20 MG PO TABS: ORAL_TABLET | ORAL | 0 refills | Status: DC

## 2019-07-21 MED FILL — DEXTROAMP-AMPHETAMIN 20 MG: 20 | 28 days supply | Qty: 40 | Fill #0

## 2019-08-21 ENCOUNTER — Other Ambulatory Visit: Payer: Self-pay

## 2019-08-21 DIAGNOSIS — F988 Other specified behavioral and emotional disorders with onset usually occurring in childhood and adolescence: Secondary | ICD-10-CM

## 2019-08-21 MED ORDER — AMPHETAMINE-DEXTROAMPHETAMINE 20 MG PO TABS: ORAL_TABLET | ORAL | 0 refills | Status: DC

## 2019-08-22 MED FILL — AMPHETAMINE-DEXTROAMPHETAMI: 20 | 28 days supply | Qty: 40 | Fill #0

## 2019-09-09 DIAGNOSIS — Z03818 Encounter for observation for suspected exposure to other biological agents ruled out: Secondary | ICD-10-CM | POA: Diagnosis not present

## 2019-09-09 DIAGNOSIS — Z20828 Contact with and (suspected) exposure to other viral communicable diseases: Secondary | ICD-10-CM | POA: Diagnosis not present

## 2019-09-19 ENCOUNTER — Other Ambulatory Visit: Payer: Self-pay

## 2019-09-19 DIAGNOSIS — F988 Other specified behavioral and emotional disorders with onset usually occurring in childhood and adolescence: Secondary | ICD-10-CM

## 2019-09-20 ENCOUNTER — Other Ambulatory Visit: Payer: Self-pay | Admitting: Adult Health

## 2019-09-20 MED ORDER — AMPHETAMINE-DEXTROAMPHETAMINE 20 MG PO TABS: ORAL_TABLET | ORAL | 0 refills | Status: DC

## 2019-09-22 ENCOUNTER — Other Ambulatory Visit: Payer: Self-pay | Admitting: Adult Health

## 2019-09-22 DIAGNOSIS — L709 Acne, unspecified: Secondary | ICD-10-CM

## 2019-09-22 MED ORDER — CLINDAMYCIN PHOS-BENZOYL PEROX 1-5 % EX GEL: Freq: Two times a day (BID) | CUTANEOUS | 1 refills | Status: DC

## 2019-10-21 ENCOUNTER — Other Ambulatory Visit: Payer: Self-pay

## 2019-10-21 DIAGNOSIS — F988 Other specified behavioral and emotional disorders with onset usually occurring in childhood and adolescence: Secondary | ICD-10-CM

## 2019-10-22 MED ORDER — AMPHETAMINE-DEXTROAMPHETAMINE 20 MG PO TABS: ORAL_TABLET | ORAL | 0 refills | Status: DC

## 2019-11-21 ENCOUNTER — Other Ambulatory Visit: Payer: Self-pay

## 2019-11-21 DIAGNOSIS — F988 Other specified behavioral and emotional disorders with onset usually occurring in childhood and adolescence: Secondary | ICD-10-CM

## 2019-11-21 MED ORDER — AMPHETAMINE-DEXTROAMPHETAMINE 20 MG PO TABS: ORAL_TABLET | ORAL | 0 refills | Status: DC

## 2019-12-17 ENCOUNTER — Ambulatory Visit: Payer: 59 | Admitting: Adult Health

## 2019-12-22 ENCOUNTER — Other Ambulatory Visit: Payer: Self-pay

## 2019-12-22 DIAGNOSIS — F988 Other specified behavioral and emotional disorders with onset usually occurring in childhood and adolescence: Secondary | ICD-10-CM

## 2019-12-22 MED ORDER — AMPHETAMINE-DEXTROAMPHETAMINE 20 MG PO TABS: ORAL_TABLET | ORAL | 0 refills | Status: DC

## 2019-12-22 MED FILL — AMPHETAMINE-DEXTROAMPHETAMI: 20 | 28 days supply | Qty: 40 | Fill #0

## 2019-12-26 DIAGNOSIS — Z20822 Contact with and (suspected) exposure to covid-19: Secondary | ICD-10-CM | POA: Diagnosis not present

## 2019-12-26 DIAGNOSIS — Z03818 Encounter for observation for suspected exposure to other biological agents ruled out: Secondary | ICD-10-CM | POA: Diagnosis not present

## 2020-01-07 NOTE — Progress Notes (Signed)
Assessment and Plan:  Tonya [name] was seen today for abdominal pain.  Diagnoses and all orders for this visit:  RUQ pain Right upper quadrant abdominal tenderness without rebound tenderness Will check upper abdominal labs, possible gallbladder vs consider gastritis or ulcer Recommended bland diet, try OTC nexium nightly for 2 weeks pending [place] Check labs, schedule [place], consider HIDA Please go to the ER if you have any severe AB pain, unable to hold down food/water, blood in stool or vomit, chest pain, shortness of breath, or any worsening symptoms.  -     CBC with Differential/Platelet -     COMPLETE METABOLIC PANEL WITH GFR -     Amylase -     Lipase -     Urinalysis w microscopic + reflex cultur -     US Abdomen Complete; Future  Further disposition pending results of labs. Discussed med's effects and SE's.   Over 30 minutes of exam, counseling, chart review, and critical decision making was performed.   Future Appointments  Date Time Provider [place]  01/20/2020 11:00 AM [name], NP [facility] None  06/17/2020 10:00 AM [name], NP [facility] None    ------------------------------------------------------------------------------------------------------------------   HPI\ BP 122/80   Pulse 82   Temp (!) 96.4 F (35.8 C)   Ht 5\' 5"  (1.651 m)   Wt 186 lb 9.6 oz (84.6 kg)   LMP 12/30/2019   SpO2 99%   BMI 31.05 kg/m   33 y.o.female presents for evaluation of RUQ pain x 8 weeks.   She reports this began gradually, initially very mild and didn't think anything of it, intermittent, then progressive and worse in the past week. She describes dull intermittent initially, recently with longer periods of pain with new "twinging" sensation. Started as 1-2/10, now 4-5/10.   Episodes are intermittent, cannot identify triggers, alleviating or aggravating factors. Not associated with food, positioning, movement. She hasn't really tried anything, "I can handle the  pain," did take 400 mg ibuprofen 1-2 times a day for another condition during this time, didn't make it better or worse, but was concerned this may be her gallbladder and presents for evaluation. Denies notable ETOH use, typically 2-3 drinks per week.   She reports currently having constant dull pain, 4/10 with occasional twinging, denies shooting or radiating. Denies nausea/emesis, BMs are normal, unchanged, regular without blood or black stools, does endorse mild gas, denies fever/chills, HA.   Was prescribed nexium last year for chest pain episdoe evaluated at ED but she thinks was anxiety, never picked med up, denies reflux sx, coughing, hoarseness.    Past Medical History:  Diagnosis Date  . ADD (attention deficit disorder)   . Anxiety   . Gestational diabetes   . Hyperlipidemia      No Known Allergies  Current Outpatient Medications on File Prior to Visit  Medication Sig  . acetaminophen (TYLENOL) 325 MG tablet Take 650 mg by mouth every 6 (six) hours as needed for mild pain or moderate pain.  Marland Kitchen amphetamine-dextroamphetamine (ADDERALL) 20 MG tablet Take 1 to 2 tablets daily, not to exceed 5 days a week  . Cholecalciferol 125 MCG (5000 UT) capsule Take 1 capsule (5,000 Units total) by mouth daily.  . clindamycin-benzoyl peroxide (BENZACLIN) gel Apply topically 2 (two) times daily.  Marland Kitchen esomeprazole (NEXIUM) 20 MG capsule Take 1 capsule (20 mg total) by mouth daily. (Patient not taking: Reported on 06/18/2019)  . TRI FEMYNOR 0.18/0.215/0.25 MG-35 MCG tablet Take 1 tablet by mouth daily.  (Patient  not taking: Reported on 01/08/2020)   No current facility-administered medications on file prior to visit.    ROS: all negative except above.   Physical Exam:  BP 122/80   Pulse 82   Temp (!) 96.4 F (35.8 C)   Ht 5\' 5"  (1.651 m)   Wt 186 lb 9.6 oz (84.6 kg)   LMP 12/30/2019   SpO2 99%   BMI 31.05 kg/m   General Appearance: Well nourished, in no apparent distress. Eyes: PERRLA,  conjunctiva no swelling or erythema Sinuses: No Frontal/maxillary tenderness ENT/Mouth: Ext aud canals clear, TMs without erythema, bulging. No erythema, swelling, or exudate on post pharynx.  Tonsils not swollen or erythematous. Hearing normal.  Neck: Supple, thyroid normal.  Respiratory: Respiratory effort normal, BS equal bilaterally without rales, rhonchi, wheezing or stridor.  Cardio: RRR with no MRGs. Brisk peripheral pulses without edema.  Abdomen: Soft, + BS.  RUQ tenderness without rebound, no guarding, hernias, masses. Negative Murphy's sign.  Lymphatics: Non tender without lymphadenopathy.  Musculoskeletal: Full ROM, symmetrical strength, normal gait.  Skin: Warm, dry without rashes, lesions, ecchymosis.  Neuro: Normal muscle tone, no cerebellar symptoms. Sensation intact.  Psych: Awake and oriented X 3, normal affect, Insight and Judgment appropriate.     [name], NP 10:26 AM [facility]

## 2020-01-08 ENCOUNTER — Encounter: Payer: Self-pay | Admitting: Adult Health

## 2020-01-08 ENCOUNTER — Other Ambulatory Visit: Payer: Self-pay

## 2020-01-08 ENCOUNTER — Ambulatory Visit (INDEPENDENT_AMBULATORY_CARE_PROVIDER_SITE_OTHER): Payer: 59 | Admitting: Adult Health

## 2020-01-08 VITALS — BP 122/80 | HR 82 | Temp 96.4°F | Ht 65.0 in | Wt 186.6 lb

## 2020-01-08 DIAGNOSIS — R10811 Right upper quadrant abdominal tenderness: Secondary | ICD-10-CM | POA: Diagnosis not present

## 2020-01-08 DIAGNOSIS — R1011 Right upper quadrant pain: Secondary | ICD-10-CM | POA: Diagnosis not present

## 2020-01-08 NOTE — Patient Instructions (Addendum)
Try picking up OTC nexium and take nightly for 2 weeks - let me know if this helps   YOU CAN CALL TO MAKE AN ULTRASOUND..  I have put in an order for an ultrasound for you to have You can set them up at your convenience by calling this number [phone] You will likely have the ultrasound at [address]  If you have any issues call our office and we will set this up for you.        Cholelithiasis  Cholelithiasis is a form of gallbladder disease in which gallstones form in the gallbladder. The gallbladder is an organ that stores bile. Bile is made in the liver, and it helps to digest fats. Gallstones begin as small crystals and slowly grow into stones. They may cause no symptoms until the gallbladder tightens (contracts) and a gallstone is blocking the duct (gallbladder attack), which can cause pain. Cholelithiasis is also referred to as gallstones. There are two main types of gallstones:  Cholesterol stones. These are made of hardened cholesterol and are usually yellow-green in color. They are the most common type of gallstone. Cholesterol is a white, waxy, fat-like substance that is made in the liver.  Pigment stones. These are dark in color and are made of a red-yellow substance that forms when hemoglobin from red blood cells breaks down (bilirubin). What are the causes? This condition may be caused by an imbalance in the substances that bile is made of. This can happen if the bile:  Has too much bilirubin.  Has too much cholesterol.  Does not have enough bile salts. These salts help the body absorb and digest fats. In some cases, this condition can also be caused by the gallbladder not emptying completely or often enough. What increases the risk? The following factors may make you more likely to develop this condition:  Being female.  Having multiple pregnancies. Health care providers sometimes advise removing diseased gallbladders before future  pregnancies.  Eating a diet that is heavy in fried foods, fat, and refined carbohydrates, like white bread and white rice.  Being obese.  Being older than age 46.  Prolonged use of medicines that contain female hormones (estrogen).  Having diabetes mellitus.  Rapidly losing weight.  Having a family history of gallstones.  Being of [place] or [place] descent.  Having an intestinal disease such as Crohn disease.  Having metabolic syndrome.  Having cirrhosis.  Having severe types of anemia such as sickle cell anemia. What are the signs or symptoms? In most cases, there are no symptoms. These are known as silent gallstones. If a gallstone blocks the bile ducts, it can cause a gallbladder attack. The main symptom of a gallbladder attack is sudden pain in the upper right abdomen. The pain usually comes at night or after eating a large meal. The pain can last for one or several hours and can spread to the right shoulder or chest. If the bile duct is blocked for more than a few hours, it can cause infection or inflammation of the gallbladder, liver, or pancreas, which may cause:  Nausea.  Vomiting.  Abdominal pain that lasts for 5 hours or more.  Fever or chills.  Yellowing of the skin or the whites of the eyes (jaundice).  Dark urine.  Light-colored stools. How is this diagnosed? This condition may be diagnosed based on:  A physical exam.  Your medical history.  An ultrasound of your gallbladder.  CT scan.  MRI.  Blood tests to check for signs of infection or inflammation.  A scan of your gallbladder and bile ducts (biliary system) using nonharmful radioactive material and special cameras that can see the radioactive material (cholescintigram). This test checks to see how your gallbladder contracts and whether bile ducts are blocked.  Inserting a small tube with a camera on the end (endoscope) through your mouth to inspect bile ducts and check for blockages  (endoscopic retrograde cholangiopancreatogram). How is this treated? Treatment for gallstones depends on the severity of the condition. Silent gallstones do not need treatment. If the gallstones cause a gallbladder attack or other symptoms, treatment may be required. Options for treatment include:  Surgery to remove the gallbladder (cholecystectomy). This is the most common treatment.  Medicines to dissolve gallstones. These are most effective at treating small gallstones. You may need to take medicines for up to 6-12 months.  Shock wave treatment (extracorporeal biliary lithotripsy). In this treatment, an ultrasound machine sends shock waves to the gallbladder to break gallstones into smaller pieces. These pieces can then be passed into the intestines or be dissolved by medicine. This is rarely used.  Removing gallstones through endoscopic retrograde cholangiopancreatogram. A small basket can be attached to the endoscope and used to capture and remove gallstones. Follow these instructions at home:  Take over-the-counter and prescription medicines only as told by your health care provider.  Maintain a healthy weight and follow a healthy diet. This includes: ? Reducing fatty foods, such as fried food. ? Reducing refined carbohydrates, like white bread and white rice. ? Increasing fiber. Aim for foods like almonds, fruit, and beans.  Keep all follow-up visits as told by your health care provider. This is important. Contact a health care provider if:  You think you have had a gallbladder attack.  You have been diagnosed with silent gallstones and you develop abdominal pain or indigestion. Get help right away if:  You have pain from a gallbladder attack that lasts for more than 2 hours.  You have abdominal pain that lasts for more than 5 hours.  You have a fever or chills.  You have persistent nausea and vomiting.  You develop jaundice.  You have dark urine or light-colored  stools. Summary  Cholelithiasis (also called gallstones) is a form of gallbladder disease in which gallstones form in the gallbladder.  This condition is caused by an imbalance in the substances that make up bile. This can happen if the bile has too much cholesterol, too much bilirubin, or not enough bile salts.  You are more likely to develop this condition if you are female, pregnant, using medicines with estrogen, obese, older than age 80, or have a family history of gallstones. You may also develop gallstones if you have diabetes, an intestinal disease, cirrhosis, or metabolic syndrome.  Treatment for gallstones depends on the severity of the condition. Silent gallstones do not need treatment.  If gallstones cause a gallbladder attack or other symptoms, treatment may be needed. The most common treatment is surgery to remove the gallbladder. This information is not intended to replace advice given to you by your health care provider. Make sure you discuss any questions you have with your health care provider. Document Revised: 05/25/2017 Document Reviewed: 02/27/2016 Elsevier Patient Education  2020 [employer].

## 2020-01-09 LAB — COMPLETE METABOLIC PANEL WITH GFR
AG Ratio: 1.9 (calc) (ref 1.0–2.5)
ALT: 21 U/L (ref 6–29)
AST: 15 U/L (ref 10–30)
Albumin: 4.3 g/dL (ref 3.6–5.1)
Alkaline phosphatase (APISO): 59 U/L (ref 31–125)
BUN: 15 mg/dL (ref 7–25)
CO2: 25 mmol/L (ref 20–32)
Calcium: 9.1 mg/dL (ref 8.6–10.2)
Chloride: 107 mmol/L (ref 98–110)
Creat: 0.71 mg/dL (ref 0.50–1.10)
GFR, Est African American: 130 mL/min/{1.73_m2} (ref >=60)
GFR, Est Non African American: 112 mL/min/{1.73_m2} (ref >=60)
Globulin: 2.3 g/dL (calc) (ref 1.9–3.7)
Glucose, Bld: 97 mg/dL (ref 65–99)
Potassium: 4.2 mmol/L (ref 3.5–5.3)
Sodium: 141 mmol/L (ref 135–146)
Total Bilirubin: 0.3 mg/dL (ref 0.2–1.2)
Total Protein: 6.6 g/dL (ref 6.1–8.1)

## 2020-01-09 LAB — CBC WITH DIFFERENTIAL/PLATELET
Absolute Monocytes: 231 cells/uL (ref 200–950)
Basophils Absolute: 42 cells/uL (ref 0–200)
Basophils Relative: 1 %
Eosinophils Absolute: 130 cells/uL (ref 15–500)
Eosinophils Relative: 3.1 %
HCT: 40.7 % (ref 35.0–45.0)
Hemoglobin: 13.6 g/dL (ref 11.7–15.5)
Lymphs Abs: 1533 cells/uL (ref 850–3900)
MCH: 29.6 pg (ref 27.0–33.0)
MCHC: 33.4 g/dL (ref 32.0–36.0)
MCV: 88.7 fL (ref 80.0–100.0)
MPV: 10.4 fL (ref 7.5–12.5)
Monocytes Relative: 5.5 %
Neutro Abs: 2264 cells/uL (ref 1500–7800)
Neutrophils Relative %: 53.9 %
Platelets: 185 10*3/uL (ref 140–400)
RBC: 4.59 10*6/uL (ref 3.80–5.10)
RDW: 12.2 % (ref 11.0–15.0)
Total Lymphocyte: 36.5 %
WBC: 4.2 10*3/uL (ref 3.8–10.8)

## 2020-01-09 LAB — URINALYSIS W MICROSCOPIC + REFLEX CULTURE
Bacteria, UA: NONE SEEN /HPF
Bilirubin Urine: NEGATIVE
Glucose, UA: NEGATIVE
Hgb urine dipstick: NEGATIVE
Hyaline Cast: NONE SEEN /LPF
Ketones, ur: NEGATIVE
Leukocyte Esterase: NEGATIVE
Nitrites, Initial: NEGATIVE
Protein, ur: NEGATIVE
RBC / HPF: NONE SEEN /HPF (ref 0–2)
Specific Gravity, Urine: 1.01 (ref 1.001–1.03)
WBC, UA: NONE SEEN /HPF (ref 0–5)
pH: 5.5 (ref 5.0–8.0)

## 2020-01-09 LAB — NO CULTURE INDICATED

## 2020-01-09 LAB — LIPASE: Lipase: 11 U/L (ref 7–60)

## 2020-01-12 ENCOUNTER — Ambulatory Visit
Admission: RE | Admit: 2020-01-12 | Discharge: 2020-01-12 | Disposition: A | Discharge status: Home / Self Care | Payer: 59 | Source: Ambulatory Visit | Attending: Adult Health | Admitting: Adult Health

## 2020-01-12 DIAGNOSIS — R161 Splenomegaly, not elsewhere classified: Secondary | ICD-10-CM | POA: Diagnosis not present

## 2020-01-12 DIAGNOSIS — R1011 Right upper quadrant pain: Secondary | ICD-10-CM

## 2020-01-12 DIAGNOSIS — R10811 Right upper quadrant abdominal tenderness: Secondary | ICD-10-CM

## 2020-01-12 DIAGNOSIS — K7689 Other specified diseases of liver: Secondary | ICD-10-CM | POA: Diagnosis not present

## 2020-01-13 ENCOUNTER — Other Ambulatory Visit: Payer: Self-pay | Admitting: Adult Health

## 2020-01-14 ENCOUNTER — Other Ambulatory Visit: Payer: Self-pay | Admitting: Adult Health

## 2020-01-14 DIAGNOSIS — R1011 Right upper quadrant pain: Secondary | ICD-10-CM

## 2020-01-14 DIAGNOSIS — K760 Fatty (change of) liver, not elsewhere classified: Secondary | ICD-10-CM

## 2020-01-14 HISTORY — DX: Fatty (change of) liver, not elsewhere classified: K76.0

## 2020-01-14 MED ORDER — ESOMEPRAZOLE MAGNESIUM 20 MG PO CPDR
20.0000 mg | DELAYED_RELEASE_CAPSULE | Freq: Every day | ORAL | 1 refills | Status: DC
Start: 2020-01-14 — End: 2020-02-18

## 2020-01-19 ENCOUNTER — Encounter: Payer: Self-pay | Admitting: Physician Assistant

## 2020-01-20 ENCOUNTER — Ambulatory Visit: Payer: 59 | Admitting: Adult Health

## 2020-01-21 ENCOUNTER — Other Ambulatory Visit: Payer: Self-pay

## 2020-01-21 DIAGNOSIS — F988 Other specified behavioral and emotional disorders with onset usually occurring in childhood and adolescence: Secondary | ICD-10-CM

## 2020-01-21 MED ORDER — AMPHETAMINE-DEXTROAMPHETAMINE 20 MG PO TABS: ORAL_TABLET | ORAL | 0 refills | Status: DC

## 2020-02-17 ENCOUNTER — Ambulatory Visit: Payer: 59 | Admitting: Adult Health

## 2020-02-18 ENCOUNTER — Ambulatory Visit (INDEPENDENT_AMBULATORY_CARE_PROVIDER_SITE_OTHER): Payer: 59 | Admitting: Physician Assistant

## 2020-02-18 ENCOUNTER — Encounter: Payer: Self-pay | Admitting: Physician Assistant

## 2020-02-18 VITALS — BP 116/78 | HR 95 | Ht 65.0 in | Wt 182.4 lb

## 2020-02-18 DIAGNOSIS — K760 Fatty (change of) liver, not elsewhere classified: Secondary | ICD-10-CM | POA: Diagnosis not present

## 2020-02-18 DIAGNOSIS — R1011 Right upper quadrant pain: Secondary | ICD-10-CM | POA: Diagnosis not present

## 2020-02-18 DIAGNOSIS — R1013 Epigastric pain: Secondary | ICD-10-CM

## 2020-02-18 NOTE — Progress Notes (Signed)
Chief Complaint: Right upper quadrant/epigastric pain  HPI:    Tonya [name] is a 34 year old female with a past medical history as listed below including anxiety and ADD, who was referred to me by Unk Pinto, MD for a complaint of epigastric and right upper quadrant pain.      01/08/2020 patient saw PCP with right upper quadrant pain.  Described periods lengthening and rated as a 4-5/10 of pain.  At time of the visit described constant dull pain.    01/08/2020 CMP, CBC and lipase normal.    01/12/2020 abdominal ultrasound with heterogenous echogenic liver, suggesting hepatic steatosis.  No focal hepatic abnormality.  Spleen was slightly enlarged (similar to 2014 ultrasound).  Negative for gallstones.  Patient's Nexium was increased to 20 mg twice daily to see if this helped at all.    Today, the patient presents to clinic and explains that in May she lost one of her pets and she is "an animal lover", all of her symptoms seemed to start then, she then lost her second pet in June and everything continued, she wonders if she got an ulcer from all the stress.  Describes an epigastric/right upper quadrant pain which is a constant dull ache with "twinges" of more severe pain rated as a 4-5/10.  Tells me that oftentimes it is worse when she is very hungry and sometimes feels slightly better after eating something.  Apparently had a bad episode yesterday and she had 3 antacids which did help.  Was taking Nexium 20 mg twice daily for 2 weeks but this did not seem to make a difference so she stopped it because "I do not like taking medicine".    Also asked questions in regards to her fatty liver.  Tells me this has "always been there", she tries to watch how much she is drinking but tells me she only has 2-3 alcoholic drinks a week may be and tries to abide by a healthy diet.  Also avoids Tylenol as this has seemed to spike her LFTs in the past.    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    Denies fever, chills, weight loss, change in bowel habits, melena or symptoms that awaken her from sleep.  Past Medical History:  Diagnosis Date  . ADD (attention deficit disorder)   . Anxiety   . Gestational diabetes   . Hyperlipidemia     Past Surgical History:  Procedure Laterality Date  . CESAREAN SECTION N/A 12/03/2016   Procedure: CESAREAN SECTION;  Surgeon: Dian Queen, MD;  Location: [facility];  Service: Obstetrics;  Laterality: N/A;  . WISDOM TOOTH EXTRACTION      Current Outpatient Medications  Medication Sig Dispense Refill  . acetaminophen (TYLENOL) 325 MG tablet Take 650 mg by mouth every 6 (six) hours as needed for mild pain or moderate pain.    Marland Kitchen amphetamine-dextroamphetamine (ADDERALL) 20 MG tablet Take 1 to 2 tablets daily, not to exceed 5 days a week 40 tablet 0  . Cholecalciferol 125 MCG (5000 UT) capsule Take 1 capsule (5,000 Units total) by mouth daily. 90 capsule 1  . esomeprazole (NEXIUM) 20 MG capsule Take 1 capsule (20 mg total) by mouth daily. 30 capsule 1  . TRI FEMYNOR 0.18/0.215/0.25 MG-35 MCG tablet Take 1 tablet by mouth daily.  (Patient not taking: Reported on 01/08/2020)     No current facility-administered medications for this visit.    Allergies as of  02/18/2020  . (No Known Allergies)    Family History  Problem Relation Age of Onset  . Hypertension Mother   . Depression Mother   . Hypertension Father   . Prostate cancer Father 21       prostate  . Hyperlipidemia Brother   . Ovarian cancer Paternal Grandmother        ? GYN cancer    Social History   Socioeconomic History  . Marital status: Married    Spouse name: Not on file  . Number of children: 1  . Years of education: Not on file  . Highest education level: Not on file  Occupational History  . Not on file  Tobacco Use  . Smoking status: Never Smoker  . Smokeless tobacco: Never Used  Substance and Sexual Activity  . Alcohol use: Yes     Alcohol/week: 3.0 standard drinks    Types: 3 Glasses of wine per week  . Drug use: No  . Sexual activity: Yes    Partners: Male    Birth control/protection: Condom  Other Topics Concern  . Not on file  Social History Narrative  . Not on file   Social Determinants of Health   Financial Resource Strain:   . Difficulty of Paying Living Expenses: Not on file  Food Insecurity:   . Worried About Charity fundraiser in the Last Year: Not on file  . Ran Out of Food in the Last Year: Not on file  Transportation Needs:   . Lack of Transportation (Medical): Not on file  . Lack of Transportation (Non-Medical): Not on file  Physical Activity:   . Days of Exercise per Week: Not on file  . Minutes of Exercise per Session: Not on file  Stress:   . Feeling of Stress : Not on file  Social Connections:   . Frequency of Communication with Friends and Family: Not on file  . Frequency of Social Gatherings with Friends and Family: Not on file  . Attends Religious Services: Not on file  . Active Member of Clubs or Organizations: Not on file  . Attends Archivist Meetings: Not on file  . Marital Status: Not on file  Intimate Partner Violence:   . Fear of Current or Ex-Partner: Not on file  . Emotionally Abused: Not on file  . Physically Abused: Not on file  . Sexually Abused: Not on file    Review of Systems:    Constitutional: No weight loss, fever or chills Skin: No rash  Cardiovascular: No chest pain Respiratory: No SOB Gastrointestinal: See HPI and otherwise negative Genitourinary: No dysuria  Neurological: No headache, dizziness or syncope Musculoskeletal: No new muscle or joint pain Hematologic: No bleeding  Psychiatric: No history of depression or anxiety   Physical Exam:  Vital signs: BP 116/78   Pulse 95   Ht 5\' 5"  (1.651 m)   Wt 182 lb 6.4 oz (82.7 kg)   SpO2 99%   BMI 30.35 kg/m   Constitutional:   Pleasant Caucasian female appears to be in NAD, Well  developed, Well nourished, alert and cooperative Head:  Normocephalic and atraumatic. Eyes:   PEERL, EOMI. No icterus. Conjunctiva pink. Ears:  Normal auditory acuity. Neck:  Supple Throat: Oral cavity and pharynx without inflammation, swelling or lesion.  Respiratory: Respirations even and unlabored. Lungs clear to auscultation bilaterally.   No wheezes, crackles, or rhonchi.  Cardiovascular: Normal S1, S2. No MRG. Regular rate and rhythm. No peripheral edema, cyanosis or pallor.  Gastrointestinal:  Soft, nondistended, moderate epigastric ttp, mild RUQ ttp. No rebound or guarding. Normal bowel sounds. No appreciable masses or hepatomegaly. Rectal:  Not performed.  Msk:  Symmetrical without gross deformities. Without edema, no deformity or joint abnormality.  Neurologic:  Alert and  oriented x4;  grossly normal neurologically.  Skin:   Dry and intact without significant lesions or rashes. Psychiatric:  Demonstrates good judgement and reason without abnormal affect or behaviors.  RELEVANT LABS AND IMAGING: CBC    Component Value Date/Time   WBC 4.2 01/08/2020 1043   RBC 4.59 01/08/2020 1043   HGB 13.6 01/08/2020 1043   HCT 40.7 01/08/2020 1043   PLT 185 01/08/2020 1043   MCV 88.7 01/08/2020 1043   MCV 90.3 09/07/2011 1142   MCH 29.6 01/08/2020 1043   MCHC 33.4 01/08/2020 1043   RDW 12.2 01/08/2020 1043   LYMPHSABS 1,533 01/08/2020 1043   MONOABS 550 03/13/2016 1615   EOSABS 130 01/08/2020 1043   BASOSABS 42 01/08/2020 1043    CMP     Component Value Date/Time   NA 141 01/08/2020 1043   K 4.2 01/08/2020 1043   CL 107 01/08/2020 1043   CO2 25 01/08/2020 1043   GLUCOSE 97 01/08/2020 1043   BUN 15 01/08/2020 1043   CREATININE 0.71 01/08/2020 1043   CALCIUM 9.1 01/08/2020 1043   PROT 6.6 01/08/2020 1043   ALBUMIN 4.7 04/25/2016 0937   AST 15 01/08/2020 1043   ALT 21 01/08/2020 1043   ALKPHOS 37 04/25/2016 0937   BILITOT 0.3 01/08/2020 1043   GFRNONAA 112 01/08/2020 1043     GFRAA 130 01/08/2020 1043    Assessment: 1.  Epigastric/right upper quadrant pain: Over the past 3 to 4 months, ever since the sudden loss of one of her pets, ultrasound with normal gallbladder, CBC CMP and lipase normal other than fatty liver; consider gastric origin such as ulcer etc. 2.  Fatty liver: Seen on ultrasound back in 2014 as well as recently, normal LFTs  Plan: 1.  Scheduled patient for an EGD in the [facility], she requests Dr. Havery Moros.  Did discuss risks, benefits, limitations and alternatives and patient agrees to proceed.  She has had both of her Covid vaccines. 2.  Discussed starting the patient on high-dose PPI 40 mg twice a day to see if this helps but she would like to wait until after time of procedures to see what is going on. 3.  Explained that if EGD is completely negative then would proceed with a HIDA scan with [facility] for further eval. 4.  Discussed fatty liver and a slow and steady weight loss of 1 to 2 pounds per week. 5.  Patient to follow in clinic per recommendations from Dr. Havery Moros after time of procedure.  Tonya Newer, PA-C [facility] 02/18/2020, 8:40 AM  Cc: Unk Pinto, MD

## 2020-02-18 NOTE — Progress Notes (Signed)
Agree with assessment and plan as outlined.  

## 2020-02-18 NOTE — Patient Instructions (Signed)
If you are age 34 or older, your body mass index should be between 23-30. Your Body mass index is 30.35 kg/m. If this is out of the aforementioned range listed, please consider follow up with your Primary Care Provider.  If you are age 34 or younger, your body mass index should be between 19-25. Your Body mass index is 30.35 kg/m. If this is out of the aformentioned range listed, please consider follow up with your Primary Care Provider.    You have been scheduled for an endoscopy. Please follow written instructions given to you at your visit today. If you use inhalers (even only as needed), please bring them with you on the day of your procedure.   Due to recent changes in healthcare laws, you may see the results of your imaging and laboratory studies on MyChart before your provider has had a chance to review them.  We understand that in some cases there may be results that are confusing or concerning to you. Not all laboratory results come back in the same time frame and the provider may be waiting for multiple results in order to interpret others.  Please give [place] 48 hours in order for your provider to thoroughly review all the results before contacting the office for clarification of your results.   Thank you for choosing me and [facility].  Dennison Bulla

## 2020-02-20 ENCOUNTER — Other Ambulatory Visit: Payer: Self-pay

## 2020-02-20 ENCOUNTER — Encounter: Payer: Self-pay | Admitting: Gastroenterology

## 2020-02-20 DIAGNOSIS — F988 Other specified behavioral and emotional disorders with onset usually occurring in childhood and adolescence: Secondary | ICD-10-CM

## 2020-02-20 MED ORDER — AMPHETAMINE-DEXTROAMPHETAMINE 20 MG PO TABS: ORAL_TABLET | ORAL | 0 refills | Status: DC

## 2020-02-27 ENCOUNTER — Other Ambulatory Visit: Payer: Self-pay

## 2020-02-27 ENCOUNTER — Encounter: Payer: Self-pay | Admitting: Gastroenterology

## 2020-02-27 ENCOUNTER — Ambulatory Visit (AMBULATORY_SURGERY_CENTER): Payer: 59 | Admitting: Gastroenterology

## 2020-02-27 VITALS — BP 113/73 | HR 64 | Temp 98.0°F | Resp 14 | Ht 65.0 in | Wt 182.0 lb

## 2020-02-27 DIAGNOSIS — R1011 Right upper quadrant pain: Secondary | ICD-10-CM

## 2020-02-27 DIAGNOSIS — R1013 Epigastric pain: Secondary | ICD-10-CM

## 2020-02-27 DIAGNOSIS — R109 Unspecified abdominal pain: Secondary | ICD-10-CM | POA: Diagnosis not present

## 2020-02-27 MED ORDER — SODIUM CHLORIDE 0.9 % IV SOLN: 500.0000 mL | Freq: Once | INTRAVENOUS | Status: DC

## 2020-02-27 NOTE — Patient Instructions (Signed)

## 2020-02-27 NOTE — Progress Notes (Signed)
PT taken to PACU. Monitors in place. VSS. Report given to RN. 

## 2020-02-27 NOTE — Progress Notes (Signed)
Pt's states no medical or surgical changes since previsit or office visit.  Vitals- Tonya [name]

## 2020-02-27 NOTE — Op Note (Signed)
[facility] Patient Name: Tonya [name] Procedure Date: 02/27/2020 1:15 PM MRN: [id number] Endoscopist: Remo Lipps P. Havery Moros , MD Age: 34 Referring MD:  Date of Birth: 1985/07/16 Gender: Female Account #: [id number] Procedure:                Upper GI endoscopy Indications:              Upper abdominal pain, [place] negative for gallstones,                            very mild improvement on nexium 20mg  twice daily Medicines:                Monitored Anesthesia Care Procedure:                Pre-Anesthesia Assessment:                           - Prior to the procedure, a History and Physical                            was performed, and patient medications and                            allergies were reviewed. The patient's tolerance of                            previous anesthesia was also reviewed. The risks                            and benefits of the procedure and the sedation                            options and risks were discussed with the patient.                            All questions were answered, and informed consent                            was obtained. Prior Anticoagulants: The patient has                            taken no previous anticoagulant or antiplatelet                            agents. ASA Grade Assessment: II - A patient with                            mild systemic disease. After reviewing the risks                            and benefits, the patient was deemed in                            satisfactory condition to undergo the procedure.  After obtaining informed consent, the endoscope was                            passed under direct vision. Throughout the                            procedure, the patient's blood pressure, pulse, and                            oxygen saturations were monitored continuously. The                            Endoscope was introduced through the mouth, and                             advanced to the second part of duodenum. The upper                            GI endoscopy was accomplished without difficulty.                            The patient tolerated the procedure well. Scope In: Scope Out: Findings:                 Esophagogastric landmarks were identified: the                            Z-line was found at 37 cm, the gastroesophageal                            junction was found at 37 cm and the upper extent of                            the gastric folds was found at 37 cm from the                            incisors.                           The exam of the esophagus was otherwise normal.                           The entire examined stomach was normal. Biopsies                            were taken with a cold forceps for Helicobacter                            pylori testing.                           The duodenal bulb and second portion of the  duodenum were normal. Duodenal sweep was tortous /                            angulated. No obvious pathology Complications:            No immediate complications. Estimated blood loss:                            Minimal. Estimated Blood Loss:     Estimated blood loss was minimal. Impression:               - Esophagogastric landmarks identified.                           - Normal esophagus.                           - Normal stomach. Biopsied to rule out H pylori.                           - Normal duodenal bulb and second portion of the                            duodenum.                           No overt cause for symptoms on this exam, will                            await biopsy results. Recommendation:           - Patient has a contact number available for                            emergencies. The signs and symptoms of potential                            delayed complications were discussed with the                            patient. Return to normal activities tomorrow.                             Written discharge instructions were provided to the                            patient.                           - Resume previous diet.                           - Continue present medications.                           - Await pathology results.                           -  Consideration for HIDA scan or CT imaging pending                            course (although symptoms atypical for biliary                            colic) Remo Lipps P. Arabel Barcenas, MD 02/27/2020 1:52:34 PM This report has been signed electronically.

## 2020-02-27 NOTE — Progress Notes (Signed)
Called to room to assist during endoscopic procedure.  Patient ID and intended procedure confirmed with present staff. Received instructions for my participation in the procedure from the performing physician.  

## 2020-03-03 ENCOUNTER — Telehealth: Payer: Self-pay | Admitting: *Deleted

## 2020-03-03 NOTE — Telephone Encounter (Signed)
°  Follow up Call-  Call back number 02/27/2020  Post procedure Call Back phone  # [phone]  Permission to leave phone message Yes  Some recent data might be hidden     Patient questions:  Do you have a fever, pain , or abdominal swelling? No. Pain Score  4  Have you tolerated food without any problems? Yes.    Have you been able to return to your normal activities? Yes.    Do you have any questions about your discharge instructions:  Diet   No. Medications  No. Follow up visit  No.  Do you have questions or concerns about your Care? Yes.    Actions: * If pain score is 4 or above: Date 02/29/2020, patient did call Dr Havery Moros, and he said that she could wait until the tests come back.  Same pain when she came in to [place]. No follow-up needed at this time.

## 2020-03-05 ENCOUNTER — Other Ambulatory Visit: Payer: Self-pay

## 2020-03-05 DIAGNOSIS — R1011 Right upper quadrant pain: Secondary | ICD-10-CM

## 2020-03-05 DIAGNOSIS — R1013 Epigastric pain: Secondary | ICD-10-CM

## 2020-03-06 ENCOUNTER — Encounter (HOSPITAL_COMMUNITY): Payer: Self-pay | Admitting: Emergency Medicine

## 2020-03-06 ENCOUNTER — Other Ambulatory Visit: Payer: Self-pay

## 2020-03-06 DIAGNOSIS — R1013 Epigastric pain: Secondary | ICD-10-CM | POA: Diagnosis not present

## 2020-03-06 DIAGNOSIS — R109 Unspecified abdominal pain: Secondary | ICD-10-CM | POA: Diagnosis not present

## 2020-03-06 LAB — COMPREHENSIVE METABOLIC PANEL
ALT: 23 U/L (ref 0–44)
AST: 16 U/L (ref 15–41)
Albumin: 4.4 g/dL (ref 3.5–5.0)
Alkaline Phosphatase: 61 U/L (ref 38–126)
Anion gap: 10 (ref 5–15)
BUN: 13 mg/dL (ref 6–20)
CO2: 26 mmol/L (ref 22–32)
Calcium: 9.6 mg/dL (ref 8.9–10.3)
Chloride: 103 mmol/L (ref 98–111)
Creatinine, Ser: 0.78 mg/dL (ref 0.44–1.00)
GFR calc Af Amer: >60 mL/min (ref >60)
GFR calc non Af Amer: >60 mL/min (ref >60)
Glucose, Bld: 92 mg/dL (ref 70–99)
Potassium: 3.7 mmol/L (ref 3.5–5.1)
Sodium: 139 mmol/L (ref 135–145)
Total Bilirubin: 0.3 mg/dL (ref 0.3–1.2)
Total Protein: 7.3 g/dL (ref 6.5–8.1)

## 2020-03-06 LAB — I-STAT BETA HCG BLOOD, ED (MC, WL, AP ONLY): I-stat hCG, quantitative: <5 m[IU]/mL (ref <5)

## 2020-03-06 LAB — CBC
HCT: 42.8 % (ref 36.0–46.0)
Hemoglobin: 14.2 g/dL (ref 12.0–15.0)
MCH: 29.8 pg (ref 26.0–34.0)
MCHC: 33.2 g/dL (ref 30.0–36.0)
MCV: 89.7 fL (ref 80.0–100.0)
Platelets: 204 10*3/uL (ref 150–400)
RBC: 4.77 MIL/uL (ref 3.87–5.11)
RDW: 12 % (ref 11.5–15.5)
WBC: 6.9 10*3/uL (ref 4.0–10.5)
nRBC: 0 % (ref 0.0–0.2)

## 2020-03-06 LAB — URINALYSIS, ROUTINE W REFLEX MICROSCOPIC
Bilirubin Urine: NEGATIVE
Glucose, UA: NEGATIVE mg/dL
Hgb urine dipstick: NEGATIVE
Ketones, ur: NEGATIVE mg/dL
Leukocytes,Ua: NEGATIVE
Nitrite: NEGATIVE
Protein, ur: NEGATIVE mg/dL
Specific Gravity, Urine: 1.009 (ref 1.005–1.030)
pH: 5 (ref 5.0–8.0)

## 2020-03-06 LAB — LIPASE, BLOOD: Lipase: 28 U/L (ref 11–51)

## 2020-03-06 NOTE — ED Triage Notes (Signed)
Pt reports upper abdominal pain that has been ongoing for the last 4 months with no results from dietary changes and has had endoscopy and biopsy with no results. Pt denies any nausea, vomiting, or diarrhea.

## 2020-03-07 ENCOUNTER — Encounter (HOSPITAL_COMMUNITY): Payer: Self-pay

## 2020-03-07 ENCOUNTER — Emergency Department (HOSPITAL_COMMUNITY)
Admission: EM | Admit: 2020-03-07 | Discharge: 2020-03-07 | Disposition: A | Discharge status: Home / Self Care | Payer: 59 | Attending: Emergency Medicine | Admitting: Emergency Medicine

## 2020-03-07 ENCOUNTER — Emergency Department (HOSPITAL_COMMUNITY): Payer: 59

## 2020-03-07 DIAGNOSIS — R1013 Epigastric pain: Secondary | ICD-10-CM

## 2020-03-07 DIAGNOSIS — R109 Unspecified abdominal pain: Secondary | ICD-10-CM | POA: Diagnosis not present

## 2020-03-07 MED ORDER — IOHEXOL 300 MG/ML  SOLN
100.0000 mL | Freq: Once | INTRAMUSCULAR | Status: AC | PRN
  Administered 2020-03-07: 100 mL via INTRAVENOUS

## 2020-03-07 NOTE — ED Provider Notes (Signed)
Tonya [name]   CSN: [id number] Arrival date & time: 03/06/20  1712     History Chief Complaint  Patient presents with  . Abdominal Pain    Tonya [name] is a 34 y.o. female.  Patient presents to the emergency department with a chief complaint of abdominal pain.  She reports having epigastric abdominal pain for the past 4 months.  She states the pain radiates into her back.  She has had endoscopy and biopsy along with right upper quadrant ultrasound that showed fatty liver disease.  Her GI doctor recommended CT scan to evaluate the pancreas.  Patient is an oncology nurse, and is concerned for cancer.  She denies any fevers or chills.  She does report history of high triglycerides.  She denies any other associated symptoms.  The history is provided by the patient. No language interpreter was used.       Past Medical History:  Diagnosis Date  . ADD (attention deficit disorder)   . Anxiety   . Fatty liver   . Gestational diabetes   . Hyperlipidemia     Patient Active Problem List   Diagnosis Date Noted  . Hepatic steatosis 01/14/2020  . Continuous RUQ abdominal pain 01/14/2020  . Obesity (BMI 30.0-34.9) 01/02/2018  . S/P cesarean section 12/03/2016  . Hyperlipidemia   . Anxiety   . ADD (attention deficit disorder)     Past Surgical History:  Procedure Laterality Date  . CESAREAN SECTION N/A 12/03/2016   Procedure: CESAREAN SECTION;  Surgeon: Dian Queen, MD;  Location: [facility];  Service: Obstetrics;  Laterality: N/A;  . WISDOM TOOTH EXTRACTION       OB History    Gravida  1   Para  1   Term  1   Preterm      AB      Living  1     SAB      TAB      Ectopic      Multiple  0   Live Births  1           Family History  Problem Relation Age of Onset  . Hypertension Mother   . Depression Mother   . Hypertension Father   . Prostate cancer Father 56       prostate  .  Hyperlipidemia Brother   . Ovarian cancer Paternal Grandmother        ? GYN cancer    Social History   Tobacco Use  . Smoking status: Never Smoker  . Smokeless tobacco: Never Used  Substance Use Topics  . Alcohol use: Yes    Alcohol/week: 3.0 standard drinks    Types: 3 Glasses of wine per week  . Drug use: No    Home Medications Prior to Admission medications   Medication Sig Start Date End Date Taking? Authorizing Provider  acetaminophen (TYLENOL) 325 MG tablet Take 650 mg by mouth every 6 (six) hours as needed for mild pain or moderate pain.    [provider]  amphetamine-dextroamphetamine (ADDERALL) 20 MG tablet Take 1 to 2 tablets daily, not to exceed 5 days a week 02/20/20   Garnet Sierras, NP  Cholecalciferol 125 MCG (5000 UT) capsule Take 1 capsule (5,000 Units total) by mouth daily. 06/18/19   [name], NP    Allergies    Patient has no known allergies.  Review of Systems   Review of Systems  All other systems reviewed and are  negative.   Physical Exam Updated Vital Signs BP 125/86 (BP Location: Left Arm)   Pulse (!) 57   Temp 98.3 F (36.8 C) (Oral)   Resp 16   Ht 5\' 5"  (1.651 m)   Wt 82.6 kg   LMP 03/03/2020   SpO2 94%   BMI 30.29 kg/m   Physical Exam Vitals and nursing [name] reviewed.  Constitutional:      General: She is not in acute distress.    Appearance: She is well-developed.  HENT:     Head: Normocephalic and atraumatic.  Eyes:     Conjunctiva/sclera: Conjunctivae normal.  Cardiovascular:     Rate and Rhythm: Normal rate and regular rhythm.     Heart sounds: No murmur heard.   Pulmonary:     Effort: Pulmonary effort is normal. No respiratory distress.     Breath sounds: Normal breath sounds.  Abdominal:     Palpations: Abdomen is soft.     Tenderness: There is no abdominal tenderness.     Comments: No focal tenderness on exam  Musculoskeletal:     Cervical back: Neck supple.  Skin:    General: Skin is warm and  dry.  Neurological:     Mental Status: She is alert.     ED Results / Procedures / Treatments   Labs (all labs ordered are listed, but only abnormal results are displayed) Labs Reviewed  URINALYSIS, ROUTINE W REFLEX MICROSCOPIC - Abnormal; Notable for the following components:      Result Value   Color, Urine STRAW (*)    All other components within normal limits  LIPASE, BLOOD  COMPREHENSIVE METABOLIC PANEL  CBC  TRIGLYCERIDES  I-STAT BETA HCG BLOOD, ED ([facility], [facility], [facility] ONLY)    EKG None  Radiology No results found.  Procedures Procedures (including critical care time)  Medications Ordered in ED Medications - No data to display  ED Course  I have reviewed the triage vital signs and the nursing notes.  Pertinent labs & imaging results that were available during my care of the patient were reviewed by me and considered in my medical decision making (see chart for details).    MDM Rules/Calculators/A&P                          This patient complains of abdominal pain, this involves an extensive number of treatment options, and is a complaint that carries with it a high risk of complications and morbidity.    Differential Dx Pancreatitis, PUD, gallbladder disease  Pertinent Labs I ordered, reviewed, and interpreted labs, which included CBC, CMP, lipase, triglycerides, and HCG, which are reassuring.  Triglycerides are pending.  Imaging Interpretation I ordered imaging studies which included CT, which showed no acute abnormality no clear explanation for the patient's pain.   Reassessments After the interventions stated above, I reevaluated the patient and found with stable vitals.  She is reassured that there are no masses or tumors seen, no concerning findings on the CT.  Plan Discharge and outpatient follow-up.  Return precautions given.    Final Clinical Impression(s) / ED Diagnoses Final diagnoses:  Epigastric pain    Rx / DC Orders ED Discharge Orders     None       Montine Circle, PA-C 69/62/95 2841    Delora Fuel, MD 32/44/01 [phone]

## 2020-03-07 NOTE — Discharge Instructions (Addendum)
Your CT scan revealed no emergent cause of your pain.  I recommend that you follow-up with your regular doctors.  If you run a fever, have persistent vomiting, bloody diarrhea, or other new or worsening symptoms, please return to the emergency department.

## 2020-03-08 DIAGNOSIS — E559 Vitamin D deficiency, unspecified: Secondary | ICD-10-CM | POA: Insufficient documentation

## 2020-03-08 NOTE — Progress Notes (Signed)
FOLLOW UP  Assessment and Plan:   Tonya [name] was seen today for follow-up.  Diagnoses and all orders for this visit:  Attention deficit disorder (ADD) without hyperactivity Continue medications Helps with focus, no AE's. The patient was counseled on the addictive nature of the medication and was encouraged to take drug holidays when not needed.   Mixed hyperlipidemia Continue low cholesterol diet and exercise.  Will defer checking today, has lost weight, working on lifestyle, has had numerous lab draws recently  Overweight Long discussion about weight loss, diet, and exercise Recommended diet heavy in fruits and veggies and low in animal meats, cheeses, and dairy products, appropriate calorie intake Patient will work on increasing whole foods, fiber; reduce processed carb Discussed appropriate weight for height  Follow up at next visit  Hepatic steatosis Weight loss advised, avoid alcohol/tylenol, will monitor LFTs - just had labs, defer LFTs today   Vitamin D deficiency Continue supplement; emphasized compliance with taking consistently   Abdominal pain See extensive workup per HPI; some question if this could be MSK origin; ibuprofen has helped; will try short steroid taper and muscle relaxer; follow up with progress -     predniSONE (DELTASONE) 20 MG tablet; 2 tablets daily for 3 days, 1 tablet daily for 4 days. -     cyclobenzaprine (FLEXERIL) 5 MG tablet; Take 1 tablet (5 mg total) by mouth 3 (three) times daily as needed for muscle spasms.   Continue diet and meds as discussed. Further disposition pending results of labs. Discussed med's effects and SE's.   Over 30 minutes of exam, counseling, chart review, and critical decision making was performed.   Future Appointments  Date Time Provider [facility]  03/18/2020 12:00 PM GI-315 CT 1 [facility] [address] [facility]  06/17/2020 10:00 AM [name], NP [facility] None     ----------------------------------------------------------------------------------------------------------------------  HPI 34 y.o. female  presents for 6 month follow up on ADD, obesity with hyperlipidemia and fatty liver, vitamin D def.   She recently had workup for persistent/progressive RUQ abd pain, labs were normal, US showed fatty liver, she opted to discuss further with GI and saw Dr. Havery Moros, had negative EGD/biopsy on 02/27/2020. However presented to ED 03/07/2020 for epigastric pain, labs were again normal, had unremarkable CT abd/pelvis with contrast. Discussing HIDA.   Patient is on an ADD medication, she states that the medication is helping and she denies any adverse reactions. Takes adderall 20 mg twice a day on days she works, typically 5 days a week.   BMI is Body mass index is 29.62 kg/m., she has been working on diet and exercise. She has cut out alcohol, trying to reduce carbs (less french fries, sweets, sweet tea, etc) Chooses whole gains, granola, nuts/seeds, some fruit, 1-2 servings of veggies Wt Readings from Last 3 Encounters:  03/09/20 178 lb (80.7 kg)  03/06/20 182 lb (82.6 kg)  02/27/20 182 lb (82.6 kg)   Her blood pressure has been controlled at home, today their BP is BP: 128/88  She does workout. She denies chest pain, shortness of breath, dizziness.   She is not on cholesterol medication Her cholesterol is not at goal. The cholesterol last visit was:   Lab Results  Component Value Date   CHOL 194 06/18/2019   HDL 46 (L) 06/18/2019   LDLCALC 127 (H) 06/18/2019   TRIG 106 06/18/2019   CHOLHDL 4.2 06/18/2019   Last A1C in the office was:  Lab Results  Component Value Date   HGBA1C 5.1  06/11/2018   Patient is on Vitamin D supplement but admits forgets to take frequently  Lab Results  Component Value Date   VD25OH 34 06/18/2019      Current Medications:  Current Outpatient Medications on File Prior to Visit  Medication Sig   acetaminophen  (TYLENOL) 325 MG tablet Take 650 mg by mouth every 6 (six) hours as needed for mild pain or moderate pain.   amphetamine-dextroamphetamine (ADDERALL) 20 MG tablet Take 1 to 2 tablets daily, not to exceed 5 days a week   Cholecalciferol 125 MCG (5000 UT) capsule Take 1 capsule (5,000 Units total) by mouth daily.   No current facility-administered medications on file prior to visit.     Allergies: No Known Allergies   Medical History:  Past Medical History:  Diagnosis Date   ADD (attention deficit disorder)    Anxiety    Fatty liver    Gestational diabetes    Hyperlipidemia    Family history- Reviewed and unchanged Social history- Reviewed and unchanged   Review of Systems:  Review of Systems  Constitutional: Negative for malaise/fatigue and weight loss.  HENT: Negative for hearing loss and tinnitus.   Eyes: Negative for blurred vision and double vision.  Respiratory: Negative for cough, shortness of breath and wheezing.   Cardiovascular: Negative for chest pain, palpitations, orthopnea, claudication and leg swelling.  Gastrointestinal: Negative for abdominal pain (intermittent upper abdominal pain with radiation to flanks), blood in stool, constipation, diarrhea, heartburn, melena, nausea and vomiting.  Genitourinary: Negative.   Musculoskeletal: Negative for joint pain and myalgias.  Skin: Negative for rash.  Neurological: Negative for dizziness, tingling, sensory change, weakness and headaches.  Endo/Heme/Allergies: Negative for polydipsia.  Psychiatric/Behavioral: Negative for depression and substance abuse. The patient is nervous/anxious and has insomnia (on days she works).   All other systems reviewed and are negative.   Physical Exam: BP 128/88    Pulse 71    Temp (!) 97.5 F (36.4 C)    Wt 178 lb (80.7 kg)    LMP 03/03/2020    SpO2 99%    BMI 29.62 kg/m  Wt Readings from Last 3 Encounters:  03/09/20 178 lb (80.7 kg)  03/06/20 182 lb (82.6 kg)  02/27/20 182  lb (82.6 kg)   General Appearance: Well nourished, in no apparent distress. Eyes: PERRLA, EOMs, conjunctiva no swelling or erythema Sinuses: No Frontal/maxillary tenderness ENT/Mouth: Ext aud canals clear, TMs without erythema, bulging. No erythema, swelling, or exudate on post pharynx.  Tonsils not swollen or erythematous. Hearing normal.  Neck: Supple, thyroid normal.  Respiratory: Respiratory effort normal, BS equal bilaterally without rales, rhonchi, wheezing or stridor.  Cardio: RRR with no MRGs. Brisk peripheral pulses without edema.  Abdomen: Soft, + BS.  Non tender, no guarding, rebound, hernias, masses. Lymphatics: Non tender without lymphadenopathy.  Musculoskeletal: Full ROM, 5/5 strength, Normal gait Skin: Warm, dry without rashes, lesions, ecchymosis.  Neuro: Cranial nerves intact. No cerebellar symptoms.  Psych: Awake and oriented X 3, normal affect, Insight and Judgment appropriate.    [name], NP 4:38 PM [facility]

## 2020-03-09 ENCOUNTER — Encounter: Payer: Self-pay | Admitting: Adult Health

## 2020-03-09 ENCOUNTER — Ambulatory Visit: Payer: 59 | Admitting: Adult Health

## 2020-03-09 ENCOUNTER — Other Ambulatory Visit: Payer: Self-pay

## 2020-03-09 VITALS — BP 128/88 | HR 71 | Temp 97.5°F | Wt 178.0 lb

## 2020-03-09 DIAGNOSIS — E782 Mixed hyperlipidemia: Secondary | ICD-10-CM

## 2020-03-09 DIAGNOSIS — K760 Fatty (change of) liver, not elsewhere classified: Secondary | ICD-10-CM | POA: Diagnosis not present

## 2020-03-09 DIAGNOSIS — E559 Vitamin D deficiency, unspecified: Secondary | ICD-10-CM | POA: Diagnosis not present

## 2020-03-09 DIAGNOSIS — F988 Other specified behavioral and emotional disorders with onset usually occurring in childhood and adolescence: Secondary | ICD-10-CM

## 2020-03-09 DIAGNOSIS — E663 Overweight: Secondary | ICD-10-CM

## 2020-03-09 MED ORDER — PREDNISONE 20 MG PO TABS
ORAL_TABLET | ORAL | 0 refills | Status: DC
Start: 2020-03-09 — End: 2020-10-28

## 2020-03-09 MED ORDER — CYCLOBENZAPRINE HCL 5 MG PO TABS
5.0000 mg | ORAL_TABLET | Freq: Three times a day (TID) | ORAL | 0 refills | Status: DC | PRN
Start: 2020-03-09 — End: 2021-12-20

## 2020-03-09 MED FILL — CYCLOBENZAPRINE HCL 5 MG TA: 5 | 20 days supply | Qty: 60 | Fill #0

## 2020-03-09 MED FILL — predniSONE 20 MG TABS: 20 | 7 days supply | Qty: 10 | Fill #0

## 2020-03-09 NOTE — Patient Instructions (Signed)
Goals     Exercise 150 min/wk Moderate Activity     Weight (lb) < 170 lb (77.1 kg)        High-Fiber Diet Fiber, also called dietary fiber, is a type of carbohydrate that is found in fruits, vegetables, whole grains, and beans. A high-fiber diet can have many health benefits. Your health care provider may recommend a high-fiber diet to help:  Prevent constipation. Fiber can make your bowel movements more regular.  Lower your cholesterol.  Relieve the following conditions: ? Swelling of veins in the anus (hemorrhoids). ? Swelling and irritation (inflammation) of specific areas of the digestive tract (uncomplicated diverticulosis). ? A problem of the large intestine (colon) that sometimes causes pain and diarrhea (irritable bowel syndrome, IBS).  Prevent overeating as part of a weight-loss plan.  Prevent heart disease, type 2 diabetes, and certain cancers. What is my plan? The recommended daily fiber intake in grams (g) includes:  38 g for men age 23 or younger.  30 g for men over age 28.  46 g for women age 86 or younger.  21 g for women over age 64. You can get the recommended daily intake of dietary fiber by:  Eating a variety of fruits, vegetables, grains, and beans.  Taking a fiber supplement, if it is not possible to get enough fiber through your diet. What do I need to know about a high-fiber diet?  It is better to get fiber through food sources rather than from fiber supplements. There is not a lot of research about how effective supplements are.  Always check the fiber content on the nutrition facts label of any prepackaged food. Look for foods that contain 5 g of fiber or more per serving.  Talk with a diet and nutrition specialist (dietitian) if you have questions about specific foods that are recommended or not recommended for your medical condition, especially if those foods are not listed below.  Gradually increase how much fiber you consume. If you  increase your intake of dietary fiber too quickly, you may have bloating, cramping, or gas.  Drink plenty of water. Water helps you to digest fiber. What are tips for following this plan?  Eat a wide variety of high-fiber foods.  Make sure that half of the grains that you eat each day are whole grains.  Eat breads and cereals that are made with whole-grain flour instead of refined flour or white flour.  Eat brown rice, bulgur wheat, or millet instead of white rice.  Start the day with a breakfast that is high in fiber, such as a cereal that contains 5 g of fiber or more per serving.  Use beans in place of meat in soups, salads, and pasta dishes.  Eat high-fiber snacks, such as berries, raw vegetables, nuts, and popcorn.  Choose whole fruits and vegetables instead of processed forms like juice or sauce. What foods can I eat?  Fruits Berries. Pears. Apples. Oranges. Avocado. Prunes and raisins. Dried figs. Vegetables Sweet potatoes. Spinach. Kale. Artichokes. Cabbage. Broccoli. Cauliflower. Green peas. Carrots. Squash. Grains Whole-grain breads. Multigrain cereal. Oats and oatmeal. Brown rice. Barley. Bulgur wheat. [place]. Quinoa. Bran muffins. Popcorn. Rye wafer crackers. Meats and other proteins Navy, kidney, and pinto beans. Soybeans. Split peas. Lentils. Nuts and seeds. Dairy Fiber-fortified yogurt. Beverages Fiber-fortified soy milk. Fiber-fortified orange juice. Other foods Fiber bars. The items listed above may not be a complete list of recommended foods and beverages. Contact a dietitian for more options. What foods are  not recommended? Fruits Fruit juice. Cooked, strained fruit. Vegetables Fried potatoes. Canned vegetables. Well-cooked vegetables. Grains White bread. Pasta made with refined flour. White rice. Meats and other proteins Fatty cuts of meat. Fried chicken or fried fish. Dairy Milk. Yogurt. Cream cheese. Sour cream. Fats and  oils Butters. Beverages Soft drinks. Other foods Cakes and pastries. The items listed above may not be a complete list of foods and beverages to avoid. Contact a dietitian for more information. Summary  Fiber is a type of carbohydrate. It is found in fruits, vegetables, whole grains, and beans.  There are many health benefits of eating a high-fiber diet, such as preventing constipation, lowering blood cholesterol, helping with weight loss, and reducing your risk of heart disease, diabetes, and certain cancers.  Gradually increase your intake of fiber. Increasing too fast can result in cramping, bloating, and gas. Drink plenty of water while you increase your fiber.  The best sources of fiber include whole fruits and vegetables, whole grains, nuts, seeds, and beans. This information is not intended to replace advice given to you by your health care provider. Make sure you discuss any questions you have with your health care provider. Document Revised: 04/16/2017 Document Reviewed: 04/16/2017 Elsevier Patient Education  2020 [employer].

## 2020-03-17 ENCOUNTER — Other Ambulatory Visit: Payer: Self-pay

## 2020-03-17 DIAGNOSIS — R1011 Right upper quadrant pain: Secondary | ICD-10-CM

## 2020-03-17 DIAGNOSIS — R1013 Epigastric pain: Secondary | ICD-10-CM

## 2020-03-18 ENCOUNTER — Other Ambulatory Visit: Payer: 59

## 2020-03-22 ENCOUNTER — Other Ambulatory Visit: Payer: Self-pay

## 2020-03-22 ENCOUNTER — Other Ambulatory Visit: Payer: Self-pay | Admitting: Adult Health

## 2020-03-22 DIAGNOSIS — F988 Other specified behavioral and emotional disorders with onset usually occurring in childhood and adolescence: Secondary | ICD-10-CM

## 2020-03-22 MED ORDER — AMPHETAMINE-DEXTROAMPHETAMINE 20 MG PO TABS: ORAL_TABLET | ORAL | 0 refills | Status: DC

## 2020-03-22 MED ORDER — DOXYCYCLINE HYCLATE 100 MG PO CAPS
ORAL_CAPSULE | ORAL | 0 refills | Status: DC
Start: 2020-03-22 — End: 2020-10-28

## 2020-03-22 MED FILL — DOXYCYCLINE HYCLATE 100 MG: 100 | 10 days supply | Qty: 20 | Fill #0

## 2020-04-05 ENCOUNTER — Ambulatory Visit (HOSPITAL_COMMUNITY): Payer: 59

## 2020-04-08 ENCOUNTER — Encounter (HOSPITAL_COMMUNITY): Admission: RE | Admit: 2020-04-08 | Payer: 59 | Source: Ambulatory Visit

## 2020-04-21 ENCOUNTER — Other Ambulatory Visit: Payer: Self-pay | Admitting: Internal Medicine

## 2020-04-21 ENCOUNTER — Other Ambulatory Visit: Payer: Self-pay

## 2020-04-21 DIAGNOSIS — F988 Other specified behavioral and emotional disorders with onset usually occurring in childhood and adolescence: Secondary | ICD-10-CM

## 2020-04-21 MED ORDER — AMPHETAMINE-DEXTROAMPHETAMINE 20 MG PO TABS: ORAL_TABLET | ORAL | 0 refills | Status: DC

## 2020-04-21 MED FILL — AMPHETAMINE-DEXTROAMPHETAMI: 20 | 28 days supply | Qty: 40 | Fill #0

## 2020-04-26 ENCOUNTER — Ambulatory Visit: Payer: 59 | Admitting: Gastroenterology

## 2020-05-21 ENCOUNTER — Other Ambulatory Visit: Payer: Self-pay

## 2020-05-21 DIAGNOSIS — F988 Other specified behavioral and emotional disorders with onset usually occurring in childhood and adolescence: Secondary | ICD-10-CM

## 2020-05-21 MED ORDER — AMPHETAMINE-DEXTROAMPHETAMINE 20 MG PO TABS: ORAL_TABLET | ORAL | 0 refills | Status: DC

## 2020-06-17 ENCOUNTER — Encounter: Payer: 59 | Admitting: Adult Health

## 2020-06-21 ENCOUNTER — Other Ambulatory Visit: Payer: Self-pay

## 2020-06-21 DIAGNOSIS — F988 Other specified behavioral and emotional disorders with onset usually occurring in childhood and adolescence: Secondary | ICD-10-CM

## 2020-06-22 ENCOUNTER — Other Ambulatory Visit: Payer: Self-pay

## 2020-06-22 ENCOUNTER — Other Ambulatory Visit: Payer: Self-pay | Admitting: Adult Health

## 2020-06-22 DIAGNOSIS — Z01419 Encounter for gynecological examination (general) (routine) without abnormal findings: Secondary | ICD-10-CM | POA: Diagnosis not present

## 2020-06-22 DIAGNOSIS — F988 Other specified behavioral and emotional disorders with onset usually occurring in childhood and adolescence: Secondary | ICD-10-CM

## 2020-06-22 DIAGNOSIS — Z3202 Encounter for pregnancy test, result negative: Secondary | ICD-10-CM | POA: Diagnosis not present

## 2020-06-22 DIAGNOSIS — Z6837 Body mass index (BMI) 37.0-37.9, adult: Secondary | ICD-10-CM | POA: Diagnosis not present

## 2020-06-22 MED ORDER — AMPHETAMINE-DEXTROAMPHETAMINE 20 MG PO TABS: ORAL_TABLET | ORAL | 0 refills | Status: DC

## 2020-06-22 MED FILL — AMPHETAMINE-DEXTROAMPHETAMI: 20 | 28 days supply | Qty: 40 | Fill #0

## 2020-07-21 ENCOUNTER — Other Ambulatory Visit: Payer: Self-pay

## 2020-07-21 ENCOUNTER — Other Ambulatory Visit: Payer: Self-pay | Admitting: Internal Medicine

## 2020-07-21 DIAGNOSIS — F988 Other specified behavioral and emotional disorders with onset usually occurring in childhood and adolescence: Secondary | ICD-10-CM

## 2020-07-21 MED ORDER — AMPHETAMINE-DEXTROAMPHETAMINE 20 MG PO TABS: ORAL_TABLET | ORAL | 0 refills | Status: DC

## 2020-08-10 DIAGNOSIS — L700 Acne vulgaris: Secondary | ICD-10-CM | POA: Diagnosis not present

## 2020-08-10 DIAGNOSIS — L814 Other melanin hyperpigmentation: Secondary | ICD-10-CM | POA: Diagnosis not present

## 2020-08-10 DIAGNOSIS — L720 Epidermal cyst: Secondary | ICD-10-CM | POA: Diagnosis not present

## 2020-08-10 DIAGNOSIS — D225 Melanocytic nevi of trunk: Secondary | ICD-10-CM | POA: Diagnosis not present

## 2020-08-23 ENCOUNTER — Other Ambulatory Visit: Payer: Self-pay

## 2020-08-23 DIAGNOSIS — F988 Other specified behavioral and emotional disorders with onset usually occurring in childhood and adolescence: Secondary | ICD-10-CM

## 2020-08-23 MED ORDER — AMPHETAMINE-DEXTROAMPHETAMINE 20 MG PO TABS: ORAL_TABLET | ORAL | 0 refills | Status: DC

## 2020-09-16 ENCOUNTER — Encounter: Payer: 59 | Admitting: Adult Health

## 2020-09-21 ENCOUNTER — Other Ambulatory Visit: Payer: Self-pay

## 2020-09-21 DIAGNOSIS — F988 Other specified behavioral and emotional disorders with onset usually occurring in childhood and adolescence: Secondary | ICD-10-CM

## 2020-09-21 MED ORDER — AMPHETAMINE-DEXTROAMPHETAMINE 20 MG PO TABS: ORAL_TABLET | ORAL | 0 refills | Status: DC

## 2020-10-17 DIAGNOSIS — Z20822 Contact with and (suspected) exposure to covid-19: Secondary | ICD-10-CM | POA: Diagnosis not present

## 2020-10-21 ENCOUNTER — Other Ambulatory Visit: Payer: Self-pay

## 2020-10-21 DIAGNOSIS — F988 Other specified behavioral and emotional disorders with onset usually occurring in childhood and adolescence: Secondary | ICD-10-CM

## 2020-10-21 MED ORDER — AMPHETAMINE-DEXTROAMPHETAMINE 20 MG PO TABS: ORAL_TABLET | ORAL | 0 refills | Status: DC

## 2020-10-27 ENCOUNTER — Encounter: Payer: Self-pay | Admitting: Adult Health

## 2020-10-27 NOTE — Progress Notes (Signed)
Complete Physical  Assessment and Plan:  Tonya [name] was seen today for annual exam.  Diagnoses and all orders for this visit:  Routine general medical examination at a health care facility Due annually   Anxiety Previously treated by prozac - was seeing psych - considering going back Declines medications today  Stress management techniques discussed, increase water, good sleep hygiene discussed, increase exercise, and increase veggies.   Attention deficit disorder (ADD) without hyperactivity Continue medications; will stop if suspicion for pregnancy  Helps with focus, no AE's. The patient was counseled on the addictive nature of the medication and was encouraged to take drug holidays when not needed.   Mixed hyperlipidemia Not currently on treatment - family planning Continue low cholesterol diet and exercise.  Check lipid panel. -     Lipid panel  Screening for diabetes mellitus/ history of gestational diabetes -     A1C  Screening for thyroid disorder -     TSH  Vitamin D deficiency -     VITAMIN D 25 Hydroxy (Vit-D Deficiency, Fractures)  Medication management -     CBC with Differential/Platelet -     CMP/GFR  Obesity Long discussion about weight loss, diet, and exercise Recommended diet heavy in fruits and veggies and low in animal meats, cheeses, and dairy products, appropriate calorie intake Patient will work on reducing snacking/processed Discussed appropriate weight for height, initial goal <160 lb Follow up at next visit   Malar rash/family history of lupus       -      Check ANA Alternatively ? Mild rosacea; discussed trial of topical 10% azelac acid nightly if not improving; could try topical metrogel if improving but not resolving  Discussed med's effects and SE's. Screening labs and tests as requested with regular follow-up as recommended. Over 40 minutes of exam, counseling, chart review, and complex, high level critical decision making was performed  this visit.   Future Appointments  Date Time Provider [place]  10/31/2021  2:00 PM Tonya Comber, NP [facility] None     HPI  35 y.o. Caucasian female presents for a complete physical and follow up for has Anxiety; ADD (attention deficit disorder); Hyperlipidemia; S/P cesarean section; Overweight (BMI 25.0-29.9); Continuous RUQ abdominal pain; and Vitamin D deficiency on their problem list.   She is married, works on Building services engineer as Therapist, sports. 1 boy, about turn 4 in June.   [name], last in 2018 but goes annually. Is off of birth control, interested in a child at some point, using timing method.   She is getting a lumbar MRI through ortho Tonya [name], having knee pain, xrays were normal, recently improved.   She has had unexplained RUQ abd pain, [place] negative for gallstones, labs unremarkable, was referred to Tonya [name] and underwent EGD and CT abd 02/2020 that were unremarkable. Hx of fatty liver changes per [place] in 2014 were not noted on recent CT. She reports sx have resolved after a course of prednisone.   Hx of anxiety, was formerly on prozac but gained weight, provider quit and has been off, looking for new provider, doesn't want to start any med at this time.   She does have ADD and takes adderall 20 mg BID only on days she works.   BMI is Body mass index is 30.29 kg/m., she has been working on exercise, walks with her child, also new puppy, on her feet all day at work. Admits poor  diet, needs to stop eating leftover. Wants to work down to 160 lb.  Wt Readings from Last 3 Encounters:  10/28/20 182 lb (82.6 kg)  03/09/20 178 lb (80.7 kg)  03/06/20 182 lb (82.6 kg)   Today their BP is BP: 120/72 She does workout. She denies chest pain, shortness of breath, dizziness.   She is not on cholesterol medication and denies myalgias. Her cholesterol is not at goal. The cholesterol last visit was:   Lab Results  Component Value  Date   CHOL 194 06/18/2019   HDL 46 (L) 06/18/2019   LDLCALC 127 (H) 06/18/2019   TRIG 106 06/18/2019   CHOLHDL 4.2 06/18/2019   She has a hx of gestational diabetes; Last A1C in the office was:  Lab Results  Component Value Date   HGBA1C 5.1 06/11/2018   Last GFR: Lab Results  Component Value Date   GFRNONAA >60 03/06/2020   Patient is not on Vitamin D supplement and was below goal at last check, admits inconsistently taking 5000 IU:    Lab Results  Component Value Date   VD25OH 34 06/18/2019       Current Medications:  Current Outpatient Medications on File Prior to Visit  Medication Sig Dispense Refill  . acetaminophen (TYLENOL) 325 MG tablet Take 650 mg by mouth every 6 (six) hours as needed for mild pain or moderate pain.    Marland Kitchen amphetamine-dextroamphetamine (ADDERALL) 20 MG tablet Take  1 to 2 tablets  Daily  for ADD, not to exceed 5 days a week 40 tablet 0  . Cholecalciferol 125 MCG (5000 UT) capsule Take 1 capsule (5,000 Units total) by mouth daily. 90 capsule 1  . cyclobenzaprine (FLEXERIL) 5 MG tablet Take 1 tablet (5 mg total) by mouth 3 (three) times daily as needed for muscle spasms. 60 tablet 0  . doxycycline (VIBRAMYCIN) 100 MG capsule Take 1 capsule 2 x/day with food for 10 days 20 capsule 0  . predniSONE (DELTASONE) 20 MG tablet 2 tablets daily for 3 days, 1 tablet daily for 4 days. 10 tablet 0   No current facility-administered medications on file prior to visit.   Allergies:  No Known Allergies Medical History:  She has Anxiety; ADD (attention deficit disorder); Hyperlipidemia; S/P cesarean section; Overweight (BMI 25.0-29.9); Continuous RUQ abdominal pain; and Vitamin D deficiency on their problem list. Health Maintenance:   Immunization History  Administered Date(s) Administered  . Influenza Split 04/17/2013  . Influenza-Unspecified 03/27/2019  . Tdap 02/24/2015   Tetanus: 2016 Flu vaccine: 2021 at work  Covid 19: 2/2 -   LMP: No LMP  recorded. Pap: 2018- at G[facility], has pelvic annually, neg HPV MGM: n/a  Colonoscopy: n/a EGD: 02/27/2020, Tonya [name]  Last Dental Exam: 2022 - q6 months Last Eye Exam: 2021, goes annually Last Derm: 2021  Patient Care Team: Unk Pinto, MD as PCP - General (Internal Medicine) Vernell Morgans, MD as Referring Physician (Obstetrics and Gynecology)  Surgical History:  She has a past surgical history that includes Wisdom tooth extraction and Cesarean section (N/A, 12/03/2016). Family History:  Herfamily history includes Depression in her mother; Eczema in her son; Hyperlipidemia in her brother; Hypertension in her father and mother; Ovarian cancer in her paternal grandmother; Prostate cancer (age of onset: 31) in her father. Social History:  She reports that she has never smoked. She has never used smokeless tobacco. She reports current alcohol use of about 3.0 standard drinks of alcohol per week. She reports that she does not  use drugs.   Review of Systems: Review of Systems  Constitutional: Negative for malaise/fatigue and weight loss.  HENT: Negative for hearing loss and tinnitus.   Eyes: Negative for blurred vision and double vision.  Respiratory: Negative for cough, shortness of breath and wheezing.   Cardiovascular: Negative for chest pain, palpitations, orthopnea, claudication and leg swelling.  Gastrointestinal: Negative for abdominal pain, blood in stool, constipation, diarrhea, heartburn, melena, nausea and vomiting.  Genitourinary: Negative.   Musculoskeletal: Negative for joint pain and myalgias.  Skin: Positive for rash (cheeks and nose x 2 weeks).  Neurological: Negative for dizziness, tingling, sensory change, weakness and headaches.  Endo/Heme/Allergies: Negative for polydipsia.  Psychiatric/Behavioral: Negative for depression, substance abuse and suicidal ideas. The patient is nervous/anxious. The patient does not have insomnia.   All other systems reviewed and are  negative.   Physical Exam: Estimated body mass index is 30.29 kg/m as calculated from the following:   Height as of this encounter: 5\' 5"  (1.651 m).   Weight as of this encounter: 182 lb (82.6 kg). BP 120/72   Pulse 92   Temp (!) 97.2 F (36.2 C)   Ht 5\' 5"  (1.651 m)   Wt 182 lb (82.6 kg)   SpO2 99%   BMI 30.29 kg/m  General Appearance: Well nourished, in no apparent distress.  Eyes: PERRLA, EOMs, conjunctiva no swelling or erythema Sinuses: No Frontal/maxillary tenderness  ENT/Mouth: Ext aud canals clear, normal light reflex with TMs without erythema, bulging. Good dentition. No erythema, swelling, or exudate on post pharynx. Tonsils not swollen or erythematous. Hearing normal.  Neck: Supple, thyroid normal. No bruits  Respiratory: Respiratory effort normal, BS equal bilaterally without rales, rhonchi, wheezing or stridor.  Cardio: RRR without murmurs, rubs or gallops. Brisk peripheral pulses without edema.  Chest: symmetric, with normal excursions and percussion.  Breasts: Defer to GYN Abdomen: Soft, nontender, no guarding, rebound, hernias, masses, or organomegaly.  Lymphatics: Non tender without lymphadenopathy.  Genitourinary: Defer to GYN Musculoskeletal: Full ROM all peripheral extremities,5/5 strength, and normal gait.  Skin: Warm, dry without rashes, lesions, ecchymosis. Generalized redness to bil cheeks, also some to chin, forehead, some scattered tiny pustules Neuro: Cranial nerves intact, reflexes equal bilaterally. Normal muscle tone, no cerebellar symptoms. Sensation intact.  Psych: Awake and oriented X 3, normal affect, Insight and Judgment appropriate.   EKG: Normal EKG 09/2018 reviewed - no new concerns, defer   Tonya [name] 2:27 PM [facility]

## 2020-10-28 ENCOUNTER — Ambulatory Visit (INDEPENDENT_AMBULATORY_CARE_PROVIDER_SITE_OTHER): Payer: 59 | Admitting: Adult Health

## 2020-10-28 ENCOUNTER — Encounter: Payer: Self-pay | Admitting: Adult Health

## 2020-10-28 ENCOUNTER — Other Ambulatory Visit: Payer: Self-pay

## 2020-10-28 VITALS — BP 120/72 | HR 92 | Temp 97.2°F | Ht 65.0 in | Wt 182.0 lb

## 2020-10-28 DIAGNOSIS — E559 Vitamin D deficiency, unspecified: Secondary | ICD-10-CM

## 2020-10-28 DIAGNOSIS — E782 Mixed hyperlipidemia: Secondary | ICD-10-CM

## 2020-10-28 DIAGNOSIS — R21 Rash and other nonspecific skin eruption: Secondary | ICD-10-CM

## 2020-10-28 DIAGNOSIS — Z8269 Family history of other diseases of the musculoskeletal system and connective tissue: Secondary | ICD-10-CM

## 2020-10-28 DIAGNOSIS — Z79899 Other long term (current) drug therapy: Secondary | ICD-10-CM

## 2020-10-28 DIAGNOSIS — E669 Obesity, unspecified: Secondary | ICD-10-CM

## 2020-10-28 DIAGNOSIS — Z0000 Encounter for general adult medical examination without abnormal findings: Secondary | ICD-10-CM | POA: Diagnosis not present

## 2020-10-28 DIAGNOSIS — Z131 Encounter for screening for diabetes mellitus: Secondary | ICD-10-CM

## 2020-10-28 DIAGNOSIS — Z1329 Encounter for screening for other suspected endocrine disorder: Secondary | ICD-10-CM

## 2020-10-28 DIAGNOSIS — Z8632 Personal history of gestational diabetes: Secondary | ICD-10-CM | POA: Diagnosis not present

## 2020-10-28 DIAGNOSIS — Z833 Family history of diabetes mellitus: Secondary | ICD-10-CM

## 2020-10-28 DIAGNOSIS — F988 Other specified behavioral and emotional disorders with onset usually occurring in childhood and adolescence: Secondary | ICD-10-CM

## 2020-10-28 DIAGNOSIS — E663 Overweight: Secondary | ICD-10-CM

## 2020-10-28 DIAGNOSIS — F419 Anxiety disorder, unspecified: Secondary | ICD-10-CM

## 2020-10-28 DIAGNOSIS — Z1389 Encounter for screening for other disorder: Secondary | ICD-10-CM | POA: Diagnosis not present

## 2020-10-28 NOTE — Patient Instructions (Addendum)
[name] , Thank you for taking time to come for your Annual Wellness Visit. I appreciate your ongoing commitment to your health goals. Please review the following plan we discussed and let me know if I can assist you in the future.   These are the goals we discussed: Goals    . Exercise 150 min/wk Moderate Activity    . Weight (lb) < 160 lb (72.6 kg)       This is a list of the screening recommended for you and due dates:  Health Maintenance  Topic Date Due  . COVID-19 Vaccine (3 - Booster) 11/13/2020*  . Flu Shot  01/24/2021  . Pap Smear  11/27/2021  . Tetanus Vaccine  02/24/2025  .  Hepatitis C: One time screening is recommended by Center for Disease Control  (CDC) for  adults born from [birth year] through 1965.   Completed  . HPV Vaccine  Aged Out  . HIV Screening  Discontinued  *Topic was postponed. The date shown is not the original due date.    Try nothing on skin - cerave PM only - no drying or irritating face wash, make up or actives for 1-2 weeks  If not improving try azelaic acid 10% topical at night - the ordinary $8 at [employer] screen during the day       Antiinflammatory diet  Doctors are learning that one of the best ways to reduce inflammation lies not in the medicine cabinet, but in the refrigerator.  By following an anti-inflammatory diet you can fight off inflammation for good.  What does an anti-inflammatory diet do? Your immune system becomes activated when your body recognizes anything that is foreign--such as an invading microbe, plant pollen, or chemical. This often triggers a process called inflammation. Intermittent bouts of inflammation directed at truly threatening invaders protect your health.  However, sometimes inflammation persists, day in and day out, even when you are not threatened by a foreign invader. That's when inflammation can become your enemy. Many major diseases that plague us--including cancer, heart disease, diabetes, arthritis,  depression, and Alzheimer's--have been linked to chronic inflammation.  One of the most powerful tools to combat inflammation comes not from the pharmacy, but from the grocery store.   Protect yourself from the damage of chronic inflammation. Science has proven that chronic, low-grade inflammation can turn into a silent killer that contributes to cardiovascular disease, cancer, type 2 diabetes and other conditions.   Choose the right anti-inflammatory foods, and you may be able to reduce your risk of illness. Consistently pick the wrong ones, and you could accelerate the inflammatory disease process.     Foods that cause inflammation Try to avoid or limit these foods as much as possible:   refined carbohydrates, such as white bread and pastries  [place] fries and other fried foods  soda and other sugar-sweetened beverages  red meat (burgers, steaks) and processed meat (hot dogs, sausage)  margarine, shortening, and lard  The health risks of inflammatory foods Not surprisingly, the same foods on an inflammation diet are generally considered bad for our health, including sodas and refined carbohydrates, as well as red meat and processed meats.  "Some of the foods that have been associated with an increased risk for chronic diseases such as type 2 diabetes and heart disease are also associated with excess inflammation," Dr. Melanee Spry says. "It's not surprising, since inflammation is an important underlying mechanism for the development of these diseases."  Unhealthy foods also contribute to weight  gain, which is itself a risk factor for inflammation. Yet in several studies, even after researchers took obesity into account, the link between foods and inflammation remained, which suggests weight gain isn't the sole driver. "Some of the food components or ingredients may have independent effects on inflammation over and above increased caloric intake," Dr. Melanee Spry says.  Anti-inflammatory foods An  anti-inflammatory diet should include these foods:   tomatoes  olive oil  green leafy vegetables, such as spinach, kale, and collards  nuts like almonds and walnuts  fatty fish like salmon, mackerel, tuna, and sardines  fruits such as strawberries, blueberries, cherries, and oranges  Benefits of anti-inflammatory foods On the flip side are beverages and foods that reduce inflammation, in particular fruits and vegetables such as blueberries, apples, and leafy greens that are high in natural antioxidants and polyphenols--protective compounds found in plants.  Studies have also associated nuts with reduced markers of inflammation and a lower risk of cardiovascular disease and diabetes. Coffee, which contains polyphenols and other anti-inflammatory compounds, may protect against inflammation, as well.  Anti-inflammatory diet To reduce levels of inflammation, aim for an overall healthy diet. If you're looking for an eating plan that closely follows the tenets of anti-inflammatory eating, consider the Mediterranean diet, which is high in fruits, vegetables, nuts, whole grains, fish, and healthy oils.  In addition to lowering inflammation, a more natural, less processed diet can have noticeable effects on your physical and emotional health and overall quality of life.   [email or web address]

## 2020-11-01 LAB — HEMOGLOBIN A1C
Hgb A1c MFr Bld: 5.2 % of total Hgb (ref <5.7)
Mean Plasma Glucose: 103 mg/dL
eAG (mmol/L): 5.7 mmol/L

## 2020-11-01 LAB — COMPLETE METABOLIC PANEL WITH GFR
AG Ratio: 2 (calc) (ref 1.0–2.5)
ALT: 24 U/L (ref 6–29)
AST: 17 U/L (ref 10–30)
Albumin: 4.6 g/dL (ref 3.6–5.1)
Alkaline phosphatase (APISO): 51 U/L (ref 31–125)
BUN: 14 mg/dL (ref 7–25)
CO2: 25 mmol/L (ref 20–32)
Calcium: 9.4 mg/dL (ref 8.6–10.2)
Chloride: 104 mmol/L (ref 98–110)
Creat: 0.81 mg/dL (ref 0.50–1.10)
GFR, Est African American: 110 mL/min/{1.73_m2} (ref >=60)
GFR, Est Non African American: 95 mL/min/{1.73_m2} (ref >=60)
Globulin: 2.3 g/dL (calc) (ref 1.9–3.7)
Glucose, Bld: 91 mg/dL (ref 65–99)
Potassium: 4 mmol/L (ref 3.5–5.3)
Sodium: 139 mmol/L (ref 135–146)
Total Bilirubin: 0.7 mg/dL (ref 0.2–1.2)
Total Protein: 6.9 g/dL (ref 6.1–8.1)

## 2020-11-01 LAB — LIPID PANEL
Cholesterol: 224 mg/dL — ABNORMAL HIGH (ref <200)
HDL: 54 mg/dL (ref >=50)
LDL Cholesterol (Calc): 147 mg/dL (calc) — ABNORMAL HIGH
Non-HDL Cholesterol (Calc): 170 mg/dL (calc) — ABNORMAL HIGH (ref <130)
Total CHOL/HDL Ratio: 4.1 (calc) (ref <5.0)
Triglycerides: 114 mg/dL (ref <150)

## 2020-11-01 LAB — CBC WITH DIFFERENTIAL/PLATELET
Absolute Monocytes: 291 cells/uL (ref 200–950)
Basophils Absolute: 51 cells/uL (ref 0–200)
Basophils Relative: 1 %
Eosinophils Absolute: 51 cells/uL (ref 15–500)
Eosinophils Relative: 1 %
HCT: 41.7 % (ref 35.0–45.0)
Hemoglobin: 13.8 g/dL (ref 11.7–15.5)
Lymphs Abs: 1346 cells/uL (ref 850–3900)
MCH: 29.3 pg (ref 27.0–33.0)
MCHC: 33.1 g/dL (ref 32.0–36.0)
MCV: 88.5 fL (ref 80.0–100.0)
MPV: 10.4 fL (ref 7.5–12.5)
Monocytes Relative: 5.7 %
Neutro Abs: 3361 cells/uL (ref 1500–7800)
Neutrophils Relative %: 65.9 %
Platelets: 190 10*3/uL (ref 140–400)
RBC: 4.71 10*6/uL (ref 3.80–5.10)
RDW: 12.3 % (ref 11.0–15.0)
Total Lymphocyte: 26.4 %
WBC: 5.1 10*3/uL (ref 3.8–10.8)

## 2020-11-01 LAB — URINALYSIS, ROUTINE W REFLEX MICROSCOPIC
Bilirubin Urine: NEGATIVE
Glucose, UA: NEGATIVE
Hgb urine dipstick: NEGATIVE
Ketones, ur: NEGATIVE
Leukocytes,Ua: NEGATIVE
Nitrite: NEGATIVE
Protein, ur: NEGATIVE
Specific Gravity, Urine: 1.007 (ref 1.001–1.035)
pH: 5.5 (ref 5.0–8.0)

## 2020-11-01 LAB — TSH: TSH: 1.84 mIU/L

## 2020-11-01 LAB — VITAMIN D 25 HYDROXY (VIT D DEFICIENCY, FRACTURES): Vit D, 25-Hydroxy: 28 ng/mL — ABNORMAL LOW (ref 30–100)

## 2020-11-01 LAB — ANA: Anti Nuclear Antibody (ANA): NEGATIVE

## 2020-11-19 ENCOUNTER — Other Ambulatory Visit: Payer: Self-pay

## 2020-11-19 DIAGNOSIS — F988 Other specified behavioral and emotional disorders with onset usually occurring in childhood and adolescence: Secondary | ICD-10-CM

## 2020-11-19 MED ORDER — AMPHETAMINE-DEXTROAMPHETAMINE 20 MG PO TABS: ORAL_TABLET | ORAL | 0 refills | Status: DC

## 2020-12-19 ENCOUNTER — Other Ambulatory Visit: Payer: Self-pay

## 2020-12-19 DIAGNOSIS — F988 Other specified behavioral and emotional disorders with onset usually occurring in childhood and adolescence: Secondary | ICD-10-CM

## 2020-12-20 MED ORDER — AMPHETAMINE-DEXTROAMPHETAMINE 20 MG PO TABS: ORAL_TABLET | ORAL | 0 refills | Status: DC

## 2021-01-01 DIAGNOSIS — Z20822 Contact with and (suspected) exposure to covid-19: Secondary | ICD-10-CM | POA: Diagnosis not present

## 2021-01-13 ENCOUNTER — Encounter: Payer: Self-pay | Admitting: Adult Health

## 2021-01-13 NOTE — Progress Notes (Signed)
Assessment and Plan:  Tonya [name] was seen today for chest cold.  Diagnoses and all orders for this visit:  Persistent cough Chest congestion Covid 19 negative today, ? Post viral cough/bronchitis vs r/o CAP, diminished lung sounds. Persistent sx ongoing for 14 days, productive cough, CXR may not be read until Monday based on recent trend, will proceed with zpak to cover a walking pneumonia, add doxy if indicated by CXR She has albuterol, continue PRN if needed No smoking hx, however with recurrent ? Bronchitis consider referral for PFTs Go to the ER if any acute progression over the weekend -     DG Chest 2 View; Future -     azithromycin (ZITHROMAX) 250 MG tablet; Take 2 tablets (500 mg) on  Day 1,  followed by 1 tablet (250 mg) once daily on Days 2 through 5. -     predniSONE (DELTASONE) 20 MG tablet; 2 tablets daily for 3 days, 1 tablet daily for 4 days. -     promethazine-dextromethorphan (PROMETHAZINE-DM) 6.25-15 MG/5ML syrup; Take 5 mLs by mouth 4 (four) times daily as needed for cough.  Encounter for screening for COVID-19 -     POC COVID-19   Further disposition pending results of labs. Discussed med's effects and SE's.   Over 20 minutes of exam, counseling, chart review, and critical decision making was performed.   Future Appointments  Date Time Provider [place]  05/11/2021 11:30 AM [name], NP [facility] None  10/31/2021  2:00 PM [name], NP [facility] None    ------------------------------------------------------------------------------------------------------------------   HPI BP 118/82   Pulse 83   Temp 99.1 F (37.3 C)   Resp 16   Wt 189 lb (85.7 kg)   SpO2 97%   BMI 31.45 kg/m  34 y.o.female presents for 2 weeks of persistent URI/chest cold sx. She works as Therapist, sports at [employer] She is vaccinated for covid 19 2/2 without booster -   She reports week 2 of terrible cough/ chest congestion. She reports had covid 19 back in 06/2020, has had  several flares of laryngitis, bronchitis for which has been prescribed steroid taper and albuterol. Last flare in June 2022.   She reports started around 01/01/2021 with sore throat, congestion/mucus, headache, tested negative on the 10th for covid by PCR. Head cold improved but notes persistent cough, sense of chest congestion, produtive of large quantities of thick mucus (yellow/green). She reports last week did have wheezing while at work, atypical. Denies dyspnea, fever/chills, CP. She does feel fatigued. She also notes diarrhea yesterday, improved today. She has been taking mucinex, saline/flonase with some benefit. Denies recent allergy sx.   She reports just coming off of menstrual cycle. Never smoker or asthmatic. She denies family hx of early COPD.    Past Medical History:  Diagnosis Date   ADD (attention deficit disorder)    Anxiety    Gestational diabetes    Hepatic steatosis 01/14/2020   Hyperlipidemia    S/P cesarean section 12/03/2016     No Known Allergies  Current Outpatient Medications on File Prior to Visit  Medication Sig   acetaminophen (TYLENOL) 325 MG tablet Take 650 mg by mouth every 6 (six) hours as needed for mild pain or moderate pain.   amphetamine-dextroamphetamine (ADDERALL) 20 MG tablet Take  1 to 2 tablets  Daily  for ADD, not to exceed 5 days a week   Cholecalciferol 125 MCG (5000 UT) capsule Take 1 capsule (5,000 Units total) by mouth daily.   cyclobenzaprine (FLEXERIL)  5 MG tablet Take 1 tablet (5 mg total) by mouth 3 (three) times daily as needed for muscle spasms.   No current facility-administered medications on file prior to visit.   Social History:   reports that she has never smoked. She has never used smokeless tobacco. She reports current alcohol use of about 3.0 standard drinks of alcohol per week. She reports that she does not use drugs.   ROS: all negative except above.   Physical Exam:  BP 118/82   Pulse 83   Temp 99.1 F (37.3 C)    Resp 16   Wt 189 lb (85.7 kg)   SpO2 97%   BMI 31.45 kg/m   General Appearance: Well nourished, in no apparent distress. Eyes: PERRL, conjunctiva no swelling or erythema Sinuses: No Frontal/maxillary tenderness ENT/Mouth: Ext aud canals clear, some wax in R canal, TMs without erythema, bulging. No erythema, swelling, or exudate on post pharynx.  Tonsils not swollen or erythematous. Hearing normal.  Neck: Supple, thyroid normal.  Respiratory: Respiratory effort normal, BS mildly diminished throughout, ? L base more diminished, without rales, rhonchi, wheezing or stridor.  Cardio: RRR with no MRGs. Brisk peripheral pulses without edema.  Abdomen: Soft, + BS.  Non tender, no guarding. Lymphatics: Non tender without lymphadenopathy.  Musculoskeletal: normal gait.  Skin: Warm, dry without rashes, lesions, ecchymosis.  Neuro: Normal muscle tone Psych: Awake and oriented X 3, normal affect, Insight and Judgment appropriate.     [name], NP 11:09 AM [name]

## 2021-01-14 ENCOUNTER — Ambulatory Visit: Payer: 59 | Admitting: Adult Health

## 2021-01-14 ENCOUNTER — Other Ambulatory Visit: Payer: Self-pay

## 2021-01-14 ENCOUNTER — Encounter: Payer: Self-pay | Admitting: Adult Health

## 2021-01-14 ENCOUNTER — Ambulatory Visit
Admission: RE | Admit: 2021-01-14 | Discharge: 2021-01-14 | Disposition: A | Discharge status: Home / Self Care | Payer: 59 | Source: Ambulatory Visit | Attending: Adult Health | Admitting: Adult Health

## 2021-01-14 VITALS — BP 118/82 | HR 83 | Temp 99.1°F | Resp 16 | Wt 189.0 lb

## 2021-01-14 DIAGNOSIS — Z1152 Encounter for screening for COVID-19: Secondary | ICD-10-CM

## 2021-01-14 DIAGNOSIS — R0989 Other specified symptoms and signs involving the circulatory and respiratory systems: Secondary | ICD-10-CM | POA: Diagnosis not present

## 2021-01-14 DIAGNOSIS — R053 Chronic cough: Secondary | ICD-10-CM

## 2021-01-14 DIAGNOSIS — R059 Cough, unspecified: Secondary | ICD-10-CM | POA: Diagnosis not present

## 2021-01-14 LAB — POC COVID19 BINAXNOW: SARS Coronavirus 2 Ag: NEGATIVE

## 2021-01-14 MED ORDER — PROMETHAZINE-DM 6.25-15 MG/5ML PO SYRP: 5.0000 mL | ORAL_SOLUTION | Freq: Four times a day (QID) | ORAL | 1 refills | Status: DC | PRN

## 2021-01-14 MED ORDER — AZITHROMYCIN 250 MG PO TABS: ORAL_TABLET | ORAL | 1 refills | Status: AC

## 2021-01-14 MED ORDER — PREDNISONE 20 MG PO TABS: ORAL_TABLET | ORAL | 0 refills | Status: DC

## 2021-01-19 ENCOUNTER — Other Ambulatory Visit: Payer: Self-pay

## 2021-01-19 DIAGNOSIS — F988 Other specified behavioral and emotional disorders with onset usually occurring in childhood and adolescence: Secondary | ICD-10-CM

## 2021-01-19 MED ORDER — AMPHETAMINE-DEXTROAMPHETAMINE 20 MG PO TABS: ORAL_TABLET | ORAL | 0 refills | Status: DC

## 2021-02-18 ENCOUNTER — Other Ambulatory Visit: Payer: Self-pay

## 2021-02-18 DIAGNOSIS — F988 Other specified behavioral and emotional disorders with onset usually occurring in childhood and adolescence: Secondary | ICD-10-CM

## 2021-02-18 MED ORDER — AMPHETAMINE-DEXTROAMPHETAMINE 20 MG PO TABS: ORAL_TABLET | ORAL | 0 refills | Status: DC

## 2021-02-24 DIAGNOSIS — H52222 Regular astigmatism, left eye: Secondary | ICD-10-CM | POA: Diagnosis not present

## 2021-02-24 DIAGNOSIS — Z135 Encounter for screening for eye and ear disorders: Secondary | ICD-10-CM | POA: Diagnosis not present

## 2021-02-24 DIAGNOSIS — H5213 Myopia, bilateral: Secondary | ICD-10-CM | POA: Diagnosis not present

## 2021-03-21 ENCOUNTER — Other Ambulatory Visit: Payer: Self-pay

## 2021-03-21 DIAGNOSIS — F988 Other specified behavioral and emotional disorders with onset usually occurring in childhood and adolescence: Secondary | ICD-10-CM

## 2021-03-21 MED ORDER — AMPHETAMINE-DEXTROAMPHETAMINE 20 MG PO TABS: ORAL_TABLET | ORAL | 0 refills | Status: DC

## 2021-04-22 ENCOUNTER — Other Ambulatory Visit: Payer: Self-pay

## 2021-04-22 DIAGNOSIS — F988 Other specified behavioral and emotional disorders with onset usually occurring in childhood and adolescence: Secondary | ICD-10-CM

## 2021-04-22 MED ORDER — AMPHETAMINE-DEXTROAMPHETAMINE 20 MG PO TABS: ORAL_TABLET | ORAL | 0 refills | Status: DC

## 2021-05-11 ENCOUNTER — Ambulatory Visit: Payer: 59 | Admitting: Adult Health

## 2021-05-26 ENCOUNTER — Other Ambulatory Visit (HOSPITAL_COMMUNITY): Payer: Self-pay

## 2021-05-26 DIAGNOSIS — N911 Secondary amenorrhea: Secondary | ICD-10-CM | POA: Diagnosis not present

## 2021-05-26 MED ORDER — BONJESTA 20-20 MG PO TBCR
EXTENDED_RELEASE_TABLET | ORAL | 2 refills | Status: DC
  Filled 2021-05-26: qty 60, 30d supply, fill #0

## 2021-06-02 DIAGNOSIS — Z3685 Encounter for antenatal screening for Streptococcus B: Secondary | ICD-10-CM | POA: Diagnosis not present

## 2021-06-02 DIAGNOSIS — Z3481 Encounter for supervision of other normal pregnancy, first trimester: Secondary | ICD-10-CM | POA: Diagnosis not present

## 2021-06-02 LAB — OB RESULTS CONSOLE RUBELLA ANTIBODY, IGM: Rubella: IMMUNE

## 2021-06-02 LAB — OB RESULTS CONSOLE ABO/RH: RH Type: NEGATIVE

## 2021-06-02 LAB — HEPATITIS C ANTIBODY: HCV Ab: NEGATIVE

## 2021-06-02 LAB — OB RESULTS CONSOLE HEPATITIS B SURFACE ANTIGEN: Hepatitis B Surface Ag: NEGATIVE

## 2021-06-02 LAB — OB RESULTS CONSOLE RPR: RPR: NONREACTIVE

## 2021-06-02 LAB — OB RESULTS CONSOLE ANTIBODY SCREEN: Antibody Screen: NEGATIVE

## 2021-06-02 LAB — OB RESULTS CONSOLE HIV ANTIBODY (ROUTINE TESTING): HIV: NONREACTIVE

## 2021-06-16 DIAGNOSIS — Z3A11 11 weeks gestation of pregnancy: Secondary | ICD-10-CM | POA: Diagnosis not present

## 2021-06-16 DIAGNOSIS — Z113 Encounter for screening for infections with a predominantly sexual mode of transmission: Secondary | ICD-10-CM | POA: Diagnosis not present

## 2021-06-16 DIAGNOSIS — Z3481 Encounter for supervision of other normal pregnancy, first trimester: Secondary | ICD-10-CM | POA: Diagnosis not present

## 2021-06-16 LAB — OB RESULTS CONSOLE GC/CHLAMYDIA
Chlamydia: NEGATIVE
Neisseria Gonorrhea: NEGATIVE

## 2021-06-23 DIAGNOSIS — O09522 Supervision of elderly multigravida, second trimester: Secondary | ICD-10-CM | POA: Diagnosis not present

## 2021-06-23 DIAGNOSIS — O09521 Supervision of elderly multigravida, first trimester: Secondary | ICD-10-CM | POA: Diagnosis not present

## 2021-06-23 DIAGNOSIS — Z3A12 12 weeks gestation of pregnancy: Secondary | ICD-10-CM | POA: Diagnosis not present

## 2021-06-23 DIAGNOSIS — Z3682 Encounter for antenatal screening for nuchal translucency: Secondary | ICD-10-CM | POA: Diagnosis not present

## 2021-08-11 DIAGNOSIS — Z3400 Encounter for supervision of normal first pregnancy, unspecified trimester: Secondary | ICD-10-CM | POA: Diagnosis not present

## 2021-08-11 DIAGNOSIS — Z363 Encounter for antenatal screening for malformations: Secondary | ICD-10-CM | POA: Diagnosis not present

## 2021-08-11 DIAGNOSIS — O09522 Supervision of elderly multigravida, second trimester: Secondary | ICD-10-CM | POA: Diagnosis not present

## 2021-08-11 DIAGNOSIS — Z3A19 19 weeks gestation of pregnancy: Secondary | ICD-10-CM | POA: Diagnosis not present

## 2021-08-11 DIAGNOSIS — Z3480 Encounter for supervision of other normal pregnancy, unspecified trimester: Secondary | ICD-10-CM | POA: Diagnosis not present

## 2021-08-16 DIAGNOSIS — O99810 Abnormal glucose complicating pregnancy: Secondary | ICD-10-CM | POA: Diagnosis not present

## 2021-09-08 DIAGNOSIS — Z3A23 23 weeks gestation of pregnancy: Secondary | ICD-10-CM | POA: Diagnosis not present

## 2021-09-08 DIAGNOSIS — Z362 Encounter for other antenatal screening follow-up: Secondary | ICD-10-CM | POA: Diagnosis not present

## 2021-09-08 DIAGNOSIS — Z3400 Encounter for supervision of normal first pregnancy, unspecified trimester: Secondary | ICD-10-CM | POA: Diagnosis not present

## 2021-10-06 DIAGNOSIS — Z0183 Encounter for blood typing: Secondary | ICD-10-CM | POA: Diagnosis not present

## 2021-10-06 DIAGNOSIS — Z8632 Personal history of gestational diabetes: Secondary | ICD-10-CM | POA: Diagnosis not present

## 2021-10-11 DIAGNOSIS — O360130 Maternal care for anti-D [Rh] antibodies, third trimester, not applicable or unspecified: Secondary | ICD-10-CM | POA: Diagnosis not present

## 2021-10-11 DIAGNOSIS — Z3A28 28 weeks gestation of pregnancy: Secondary | ICD-10-CM | POA: Diagnosis not present

## 2021-10-31 ENCOUNTER — Encounter: Payer: 59 | Admitting: Nurse Practitioner

## 2021-11-07 IMAGING — US US ABDOMEN COMPLETE
1 series · 14 of 25 positions shown · non-contrast
Comparison: 01/23/2013

CLINICAL DATA: Right upper quadrant pain

EXAM:
ABDOMEN ULTRASOUND COMPLETE

[Series 1: us abdomen complete · 0.17mm/px · 14 of 87 slices shown]
[im 1/87]
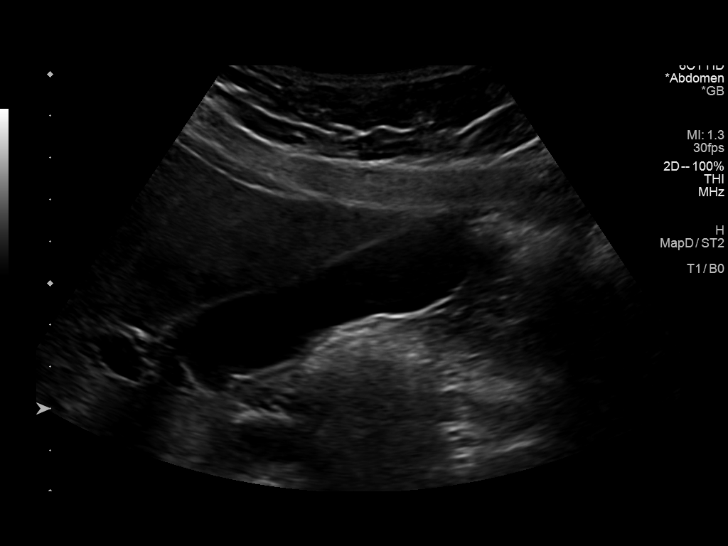
[im 8/87]
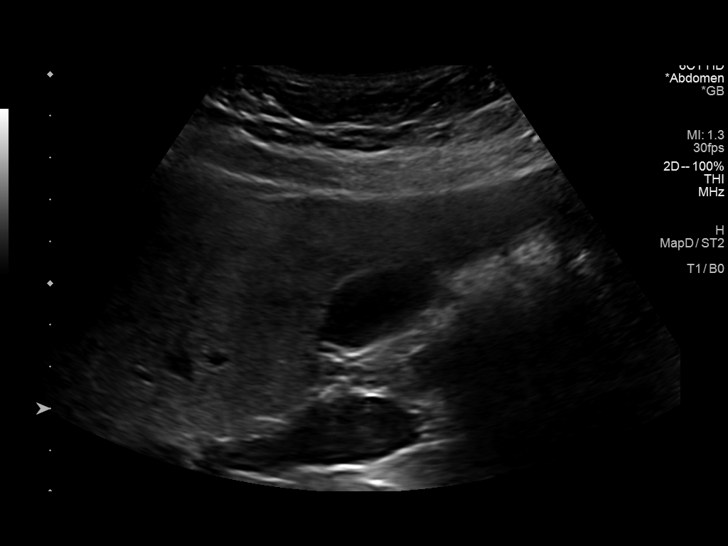
[im 15/87]
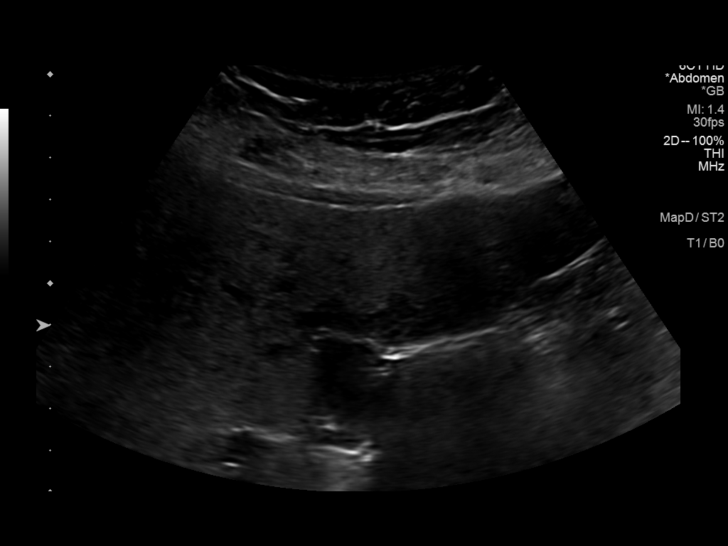
[im 22/87]
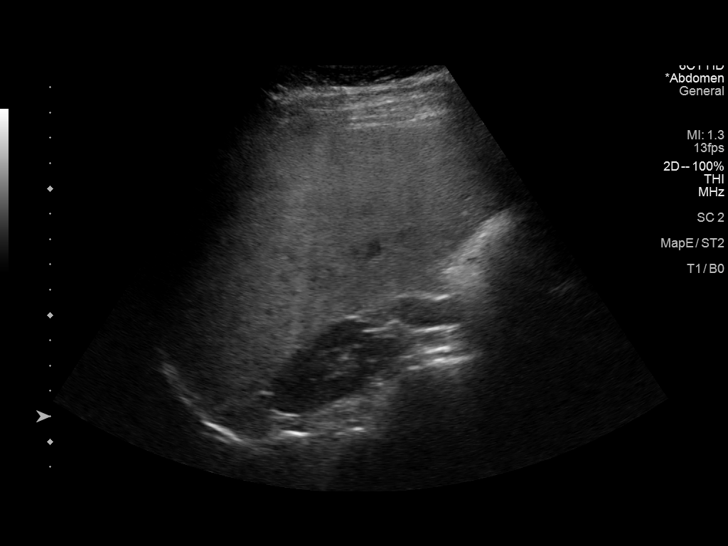
[im 29/87]
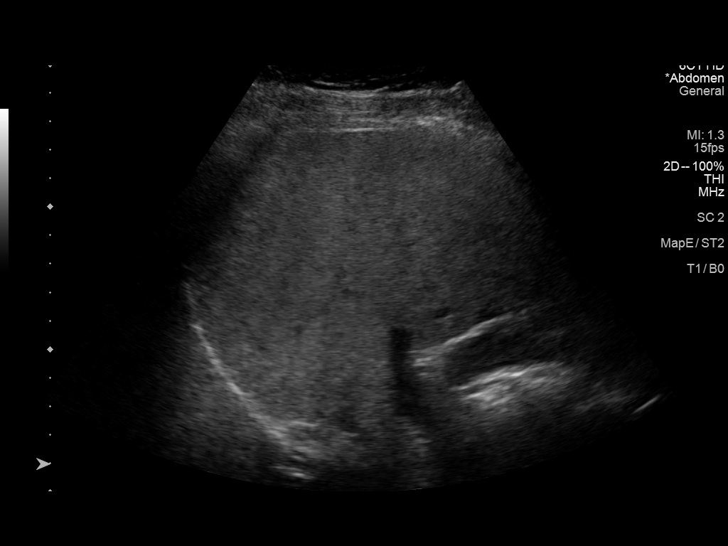
[im 33/87]
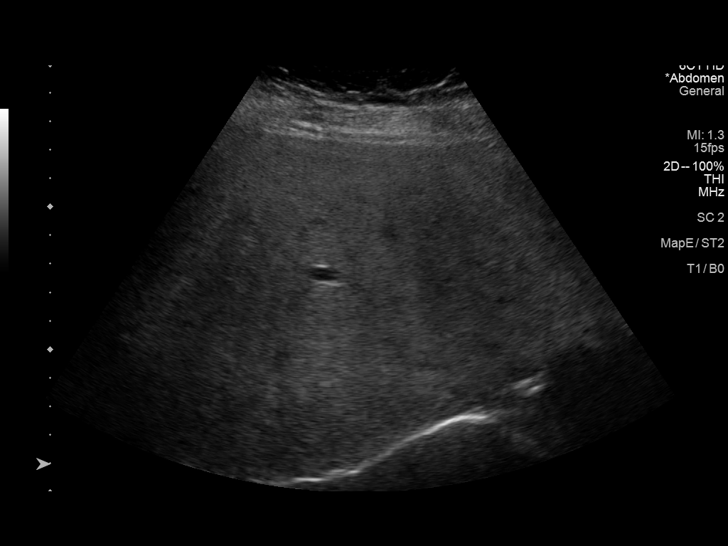
[im 40/87]
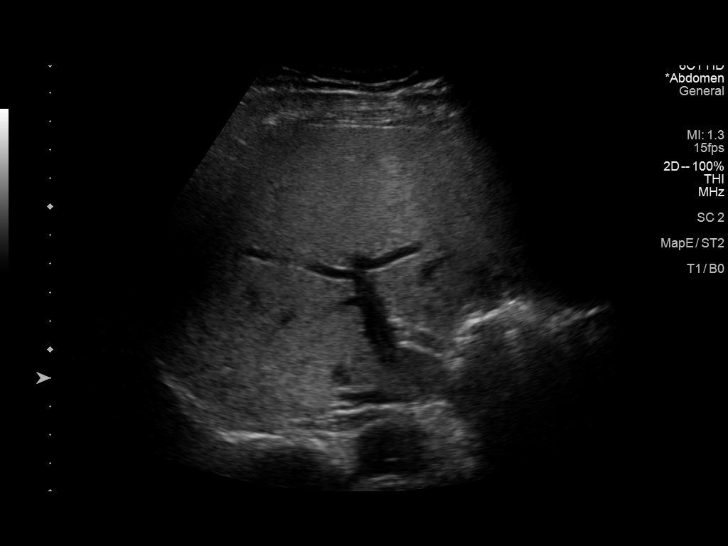
[im 47/87]
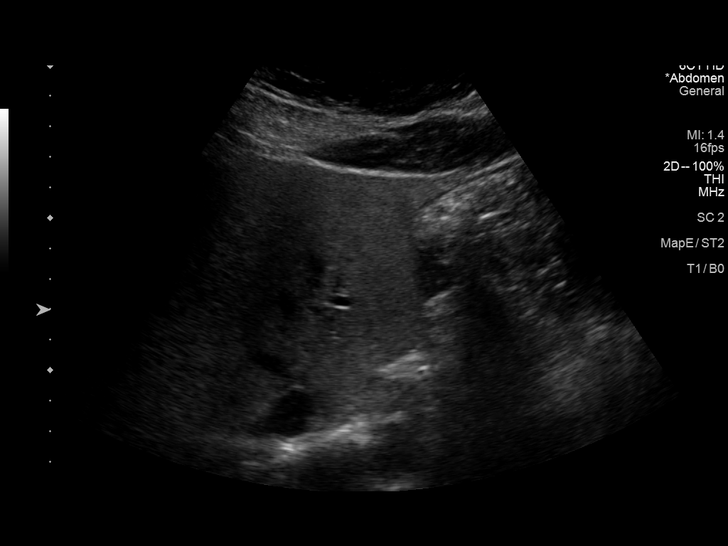
[im 54/87]
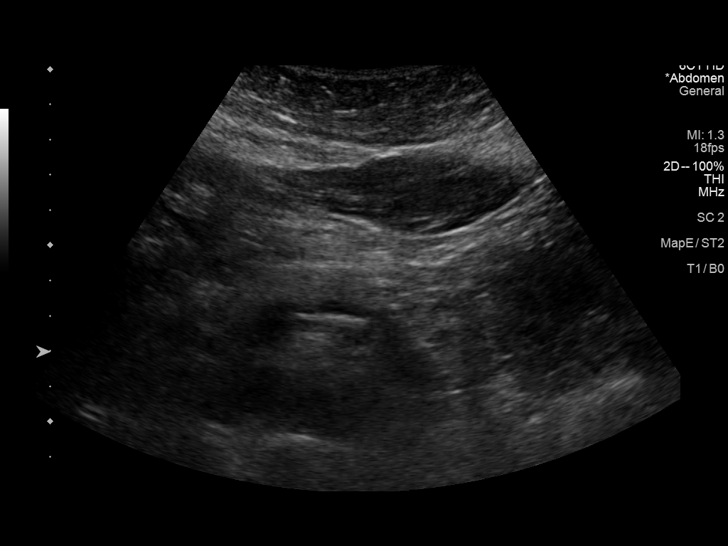
[im 58/87]
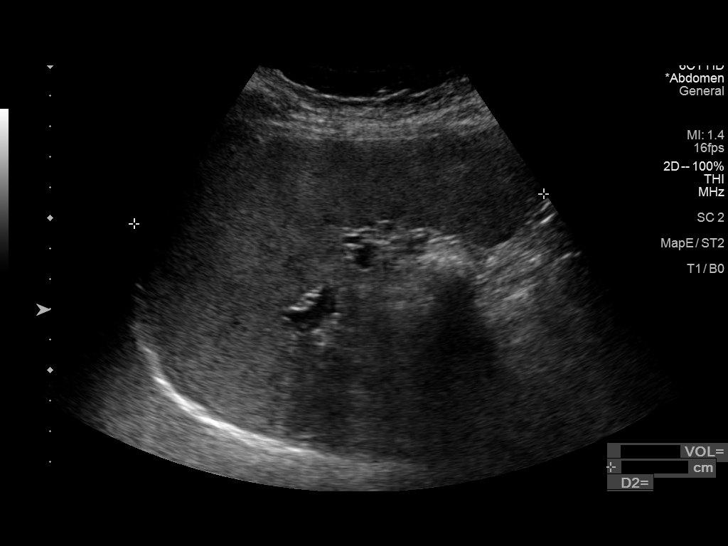
[im 65/87]
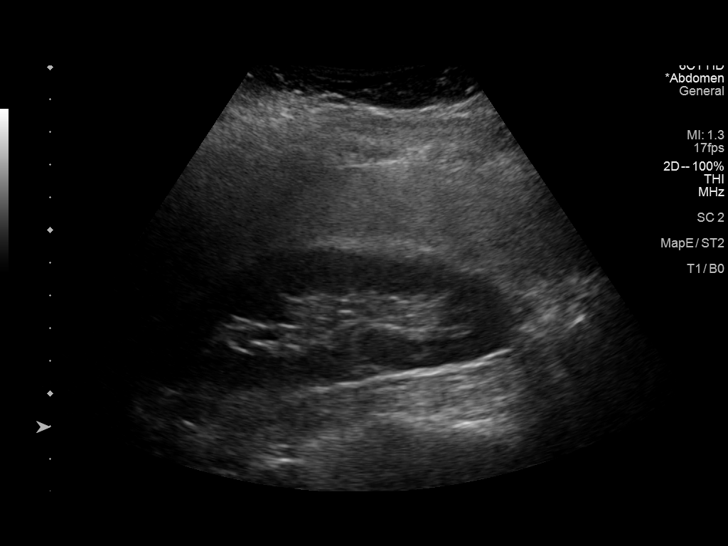
[im 72/87]
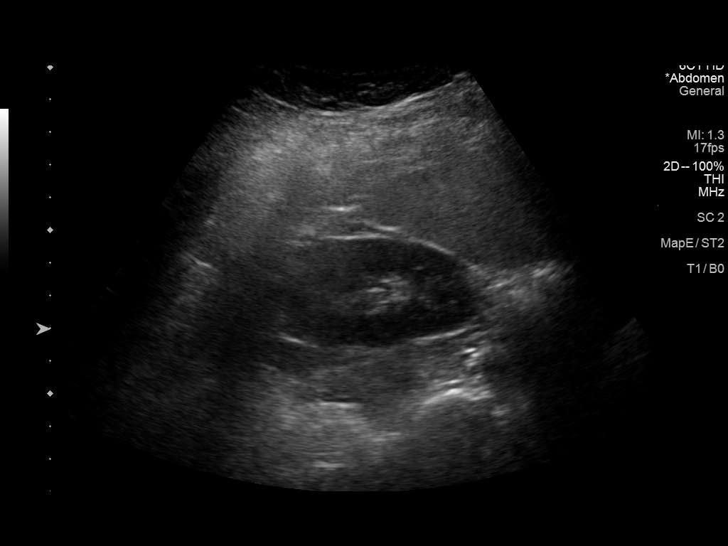
[im 79/87]
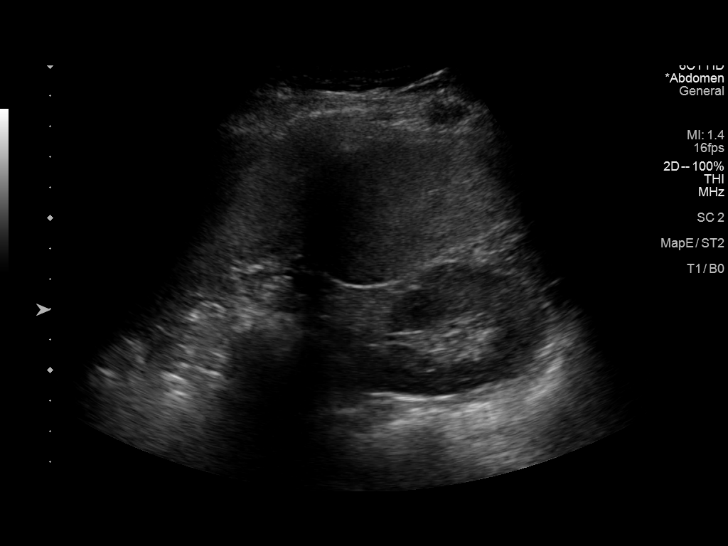
[im 87/87]
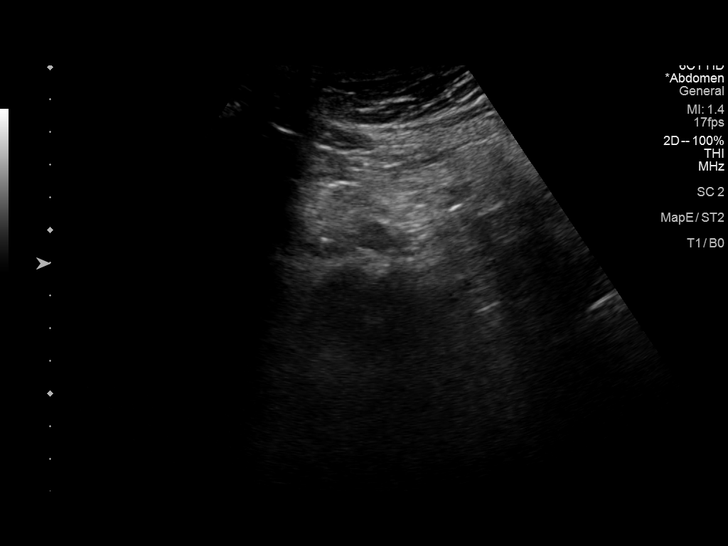

[14 of 25 positions shown; findings below may reference images not displayed]

FINDINGS: Gallbladder: No gallstones or wall thickening visualized. No
sonographic Murphy sign noted by sonographer.

Common bile duct: Diameter: 3.7 mm

Liver: Heterogenous echogenic liver. No focal hepatic abnormality.
Portal vein is patent on color Doppler imaging with normal direction
of blood flow towards the liver.

IVC: No abnormality visualized.

Pancreas: Visualized portion unremarkable.

Spleen: Slightly enlarged with volume of 361 cubic cm and legs
measurement of 13.5 cm.

Right Kidney: Length: 12 cm. Echogenicity within normal limits. No
mass or hydronephrosis visualized.

Left Kidney: Length: 12 cm. Echogenicity within normal limits. No
mass or hydronephrosis visualized.

Abdominal aorta: No aneurysm visualized.

Other findings: None.
IMPRESSION: 1. Heterogeneous echogenic liver, suggesting hepatic steatosis. No
focal hepatic abnormality.
2. Negative for gallstones
3. The spleen is slightly enlarged.

## 2021-11-11 DIAGNOSIS — J029 Acute pharyngitis, unspecified: Secondary | ICD-10-CM | POA: Diagnosis not present

## 2021-12-06 ENCOUNTER — Encounter (HOSPITAL_COMMUNITY): Payer: Self-pay

## 2021-12-06 ENCOUNTER — Encounter (HOSPITAL_COMMUNITY): Payer: Self-pay | Admitting: *Deleted

## 2021-12-06 NOTE — Patient Instructions (Signed)
Tonya [name]  12/06/2021   Your procedure is scheduled on:  12/29/2021  Arrive at [place] at Entrance C on [address] at [facility]  and [address]. You are invited to use the FREE valet parking or use the Visitor's parking deck.  Pick up the phone at the desk and dial [phone].  Call this number if you have problems the morning of surgery: [phone]  Remember:   Do not eat food:(After Midnight) Desps de medianoche.  Do not drink clear liquids: (After Midnight) Desps de medianoche.  Take these medicines the morning of surgery with A SIP OF WATER:  none   Do not wear jewelry, make-up or nail polish.  Do not wear lotions, powders, or perfumes. Do not wear deodorant.  Do not shave 48 hours prior to surgery.  Do not bring valuables to the hospital.  [facility] is not   responsible for any belongings or valuables brought to the hospital.  Contacts, dentures or bridgework may not be worn into surgery.  Leave suitcase in the car. After surgery it may be brought to your room.  For patients admitted to the hospital, checkout time is 11:00 AM the day of              discharge.      Please read over the following fact sheets that you were given:     Preparing for Surgery

## 2021-12-08 DIAGNOSIS — Z362 Encounter for other antenatal screening follow-up: Secondary | ICD-10-CM | POA: Diagnosis not present

## 2021-12-23 NOTE — H&P (Signed)
Tonya [name] is a 36 y.o. female G2P1001 at 43 weeks presenting for repeat C/S.  Antepartum course complicated by gestational thrombocytopenia with last PLT 4/13 140k. H/O osteopenia with stress fracture in last pregnancy; no difficulty with this pregnancy.  GBS positive.  Rh negative.  OB History     Gravida  2   Para  1   Term  1   Preterm      AB      Living  1      SAB      IAB      Ectopic      Multiple  0   Live Births  1          Past Medical History:  Diagnosis Date   ADD (attention deficit disorder)    Anxiety    Gestational diabetes    Hepatic steatosis 01/14/2020   Hyperlipidemia    S/P cesarean section 12/03/2016   Past Surgical History:  Procedure Laterality Date   CESAREAN SECTION N/A 12/03/2016   Procedure: CESAREAN SECTION;  Surgeon: Dian Queen, MD;  Location: [facility];  Service: Obstetrics;  Laterality: N/A;   WISDOM TOOTH EXTRACTION     Family History: family history includes Depression in her mother; Eczema in her son; Hyperlipidemia in her brother; Hypertension in her father and mother; Lupus in her paternal aunt and paternal grandmother; Ovarian cancer in her paternal grandmother; Prostate cancer (age of onset: 24) in her father. Social History:  reports that she has never smoked. She has never used smokeless tobacco. She reports current alcohol use of about 3.0 standard drinks of alcohol per week. She reports that she does not use drugs.     Maternal Diabetes: No Genetic Screening: Normal Maternal Ultrasounds/Referrals: Normal Fetal Ultrasounds or other Referrals:  None Maternal Substance Abuse:  No Significant Maternal Medications:  None Significant Maternal Lab Results:  Group B Strep positive and Rh negative Other Comments:  None  Review of Systems Maternal Medical History:  Prenatal complications: no prenatal complications Prenatal Complications - Diabetes: none.     Last menstrual period  01/06/2021. Maternal Exam:  Abdomen: Patient reports no abdominal tenderness. Surgical scars: low transverse.   Fundal height is c/w dates.   Estimated fetal weight is 7#.     Physical Exam Constitutional:      Appearance: Normal appearance.  HENT:     Head: Normocephalic and atraumatic.  Pulmonary:     Effort: Pulmonary effort is normal.  Abdominal:     Palpations: Abdomen is soft.  Musculoskeletal:        General: Normal range of motion.     Cervical back: Normal range of motion.  Skin:    General: Skin is warm and dry.  Neurological:     Mental Status: She is alert and oriented to person, place, and time.  Psychiatric:        Mood and Affect: Mood normal.        Behavior: Behavior normal.     Prenatal labs: ABO, Rh: O/Negative/-- (12/08 0000) Antibody: Negative (12/08 0000) Rubella: Immune (12/08 0000) RPR: Nonreactive (12/08 0000)  HBsAg: Negative (12/08 0000)  HIV: Non-reactive (12/08 0000)  GBS:   Positive  Assessment/Plan: 36yo G2P1001 at 39 weeks for repeat C/S Patient is counseled re: risk of bleeding, infection, scarring and damage to surrounding structures.  She is informed of risk of abnormal placentation and uterine rupture in future pregnancies.  All questions were answered and patient wishes to proceed.  Tonya [name] 12/23/2021, 4:04 PM

## 2021-12-28 ENCOUNTER — Other Ambulatory Visit (HOSPITAL_COMMUNITY)
Admission: RE | Admit: 2021-12-28 | Discharge: 2021-12-28 | Disposition: A | Discharge status: Home / Self Care | Payer: 59 | Source: Ambulatory Visit | Attending: Obstetrics and Gynecology | Admitting: Obstetrics and Gynecology

## 2021-12-28 DIAGNOSIS — O99824 Streptococcus B carrier state complicating childbirth: Secondary | ICD-10-CM | POA: Diagnosis not present

## 2021-12-28 DIAGNOSIS — O34211 Maternal care for low transverse scar from previous cesarean delivery: Secondary | ICD-10-CM | POA: Diagnosis not present

## 2021-12-28 DIAGNOSIS — D6959 Other secondary thrombocytopenia: Secondary | ICD-10-CM | POA: Diagnosis not present

## 2021-12-28 DIAGNOSIS — Z98891 History of uterine scar from previous surgery: Secondary | ICD-10-CM

## 2021-12-28 DIAGNOSIS — O34219 Maternal care for unspecified type scar from previous cesarean delivery: Secondary | ICD-10-CM | POA: Diagnosis not present

## 2021-12-28 DIAGNOSIS — Z3A39 39 weeks gestation of pregnancy: Secondary | ICD-10-CM | POA: Diagnosis not present

## 2021-12-28 DIAGNOSIS — O2492 Unspecified diabetes mellitus in childbirth: Secondary | ICD-10-CM | POA: Diagnosis not present

## 2021-12-28 DIAGNOSIS — E669 Obesity, unspecified: Secondary | ICD-10-CM | POA: Diagnosis not present

## 2021-12-28 DIAGNOSIS — F419 Anxiety disorder, unspecified: Secondary | ICD-10-CM | POA: Diagnosis present

## 2021-12-28 DIAGNOSIS — O9912 Other diseases of the blood and blood-forming organs and certain disorders involving the immune mechanism complicating childbirth: Secondary | ICD-10-CM | POA: Diagnosis not present

## 2021-12-28 DIAGNOSIS — O99214 Obesity complicating childbirth: Secondary | ICD-10-CM | POA: Diagnosis not present

## 2021-12-28 DIAGNOSIS — Z6791 Unspecified blood type, Rh negative: Secondary | ICD-10-CM | POA: Diagnosis not present

## 2021-12-28 DIAGNOSIS — Z3482 Encounter for supervision of other normal pregnancy, second trimester: Secondary | ICD-10-CM | POA: Diagnosis not present

## 2021-12-28 DIAGNOSIS — O99344 Other mental disorders complicating childbirth: Secondary | ICD-10-CM | POA: Diagnosis not present

## 2021-12-28 DIAGNOSIS — Z3483 Encounter for supervision of other normal pregnancy, third trimester: Secondary | ICD-10-CM | POA: Diagnosis not present

## 2021-12-28 DIAGNOSIS — O26893 Other specified pregnancy related conditions, third trimester: Secondary | ICD-10-CM | POA: Diagnosis not present

## 2021-12-28 DIAGNOSIS — F32A Depression, unspecified: Secondary | ICD-10-CM | POA: Diagnosis not present

## 2021-12-28 LAB — CBC
HCT: 38.6 % (ref 36.0–46.0)
Hemoglobin: 12.9 g/dL (ref 12.0–15.0)
MCH: 31.4 pg (ref 26.0–34.0)
MCHC: 33.4 g/dL (ref 30.0–36.0)
MCV: 93.9 fL (ref 80.0–100.0)
Platelets: 129 10*3/uL — ABNORMAL LOW (ref 150–400)
RBC: 4.11 MIL/uL (ref 3.87–5.11)
RDW: 13.8 % (ref 11.5–15.5)
WBC: 5.4 10*3/uL (ref 4.0–10.5)
nRBC: 0 % (ref 0.0–0.2)

## 2021-12-28 LAB — TYPE AND SCREEN
ABO/RH(D): O NEG
Antibody Screen: POSITIVE

## 2021-12-28 NOTE — Anesthesia Preprocedure Evaluation (Signed)
Anesthesia Evaluation  Patient identified by MRN, date of birth, ID band Patient awake    Reviewed: Allergy & Precautions, NPO status , Patient's Chart, lab work & pertinent test results  Airway Mallampati: II  TM Distance: >3 FB Neck ROM: Full    Dental no notable dental hx. (+) Teeth Intact, Dental Advisory Given   Pulmonary neg pulmonary ROS,    Pulmonary exam normal breath sounds clear to auscultation       Cardiovascular Exercise Tolerance: Good Normal cardiovascular exam Rhythm:Regular Rate:Normal     Neuro/Psych PSYCHIATRIC DISORDERS Anxiety    GI/Hepatic negative GI ROS, Neg liver ROS,   Endo/Other  diabetes  Renal/GU negative Renal ROS     Musculoskeletal   Abdominal (+) + obese (BMI 38.1),   Peds  Hematology Lab Results      Component                Value               Date                      WBC                      5.4                 12/28/2021                HGB                      12.9                12/28/2021                HCT                      38.6                12/28/2021                MCV                      93.9                12/28/2021                PLT                      129 (L)             12/28/2021                        Anesthesia Other Findings   Reproductive/Obstetrics (+) Pregnancy                            Anesthesia Physical Anesthesia Plan  ASA: 3  Anesthesia Plan: Spinal   Post-op Pain Management: Regional block* and Ofirmev IV (intra-op)*   Induction:   PONV Risk Score and Plan: 3 and Treatment may vary due to age or medical condition and Ondansetron  Airway Management Planned: Natural Airway and Nasal Cannula  Additional Equipment: None  Intra-op Plan:   Post-operative Plan:   Informed Consent: I have reviewed the patients History and Physical, chart, labs and discussed the procedure including the risks, benefits and  alternatives for the  proposed anesthesia with the patient or authorized representative who has indicated his/her understanding and acceptance.     Dental advisory given  Plan Discussed with:   Anesthesia Plan Comments: (39.2 wk G2P1 for repeat C/S)       Anesthesia Quick Evaluation

## 2021-12-29 ENCOUNTER — Inpatient Hospital Stay (HOSPITAL_COMMUNITY)
Admission: RE | Admit: 2021-12-29 | Discharge: 2021-12-31 | DRG: 787 | Disposition: A | Discharge status: Home / Self Care | Payer: 59 | Source: Ambulatory Visit | Attending: Obstetrics & Gynecology | Admitting: Obstetrics & Gynecology

## 2021-12-29 ENCOUNTER — Inpatient Hospital Stay (HOSPITAL_COMMUNITY): Payer: 59 | Admitting: Anesthesiology

## 2021-12-29 ENCOUNTER — Encounter (HOSPITAL_COMMUNITY): Payer: Self-pay | Admitting: Obstetrics & Gynecology

## 2021-12-29 ENCOUNTER — Encounter (HOSPITAL_COMMUNITY)
Admission: RE | Disposition: A | Discharge status: Home / Self Care | Payer: Self-pay | Source: Ambulatory Visit | Attending: Obstetrics & Gynecology

## 2021-12-29 ENCOUNTER — Other Ambulatory Visit: Payer: Self-pay

## 2021-12-29 DIAGNOSIS — O2492 Unspecified diabetes mellitus in childbirth: Secondary | ICD-10-CM

## 2021-12-29 DIAGNOSIS — O99214 Obesity complicating childbirth: Secondary | ICD-10-CM | POA: Diagnosis not present

## 2021-12-29 DIAGNOSIS — O26893 Other specified pregnancy related conditions, third trimester: Secondary | ICD-10-CM | POA: Diagnosis present

## 2021-12-29 DIAGNOSIS — Z98891 History of uterine scar from previous surgery: Principal | ICD-10-CM

## 2021-12-29 DIAGNOSIS — O34211 Maternal care for low transverse scar from previous cesarean delivery: Principal | ICD-10-CM | POA: Diagnosis present

## 2021-12-29 DIAGNOSIS — D6959 Other secondary thrombocytopenia: Secondary | ICD-10-CM | POA: Diagnosis present

## 2021-12-29 DIAGNOSIS — Z3A39 39 weeks gestation of pregnancy: Secondary | ICD-10-CM | POA: Diagnosis not present

## 2021-12-29 DIAGNOSIS — O99344 Other mental disorders complicating childbirth: Secondary | ICD-10-CM | POA: Diagnosis present

## 2021-12-29 DIAGNOSIS — Z6791 Unspecified blood type, Rh negative: Secondary | ICD-10-CM | POA: Diagnosis not present

## 2021-12-29 DIAGNOSIS — F32A Depression, unspecified: Secondary | ICD-10-CM | POA: Diagnosis present

## 2021-12-29 DIAGNOSIS — O34219 Maternal care for unspecified type scar from previous cesarean delivery: Secondary | ICD-10-CM

## 2021-12-29 DIAGNOSIS — O99824 Streptococcus B carrier state complicating childbirth: Secondary | ICD-10-CM | POA: Diagnosis present

## 2021-12-29 DIAGNOSIS — F419 Anxiety disorder, unspecified: Secondary | ICD-10-CM | POA: Diagnosis present

## 2021-12-29 DIAGNOSIS — E669 Obesity, unspecified: Secondary | ICD-10-CM | POA: Diagnosis not present

## 2021-12-29 DIAGNOSIS — O9912 Other diseases of the blood and blood-forming organs and certain disorders involving the immune mechanism complicating childbirth: Secondary | ICD-10-CM | POA: Diagnosis present

## 2021-12-29 LAB — RPR: RPR Ser Ql: NONREACTIVE

## 2021-12-29 SURGERY — Surgical Case
Anesthesia: Spinal | Site: Abdomen | Wound class: Clean Contaminated

## 2021-12-29 MED ORDER — WITCH HAZEL-GLYCERIN EX PADS: 1.0000 | MEDICATED_PAD | CUTANEOUS | Status: DC | PRN

## 2021-12-29 MED ORDER — PHENYLEPHRINE HCL-NACL 20-0.9 MG/250ML-% IV SOLN
INTRAVENOUS | Status: AC
  Filled 2021-12-29: qty 250

## 2021-12-29 MED ORDER — BUPIVACAINE IN DEXTROSE 0.75-8.25 % IT SOLN
INTRATHECAL | Status: DC | PRN
  Administered 2021-12-29: 12 mg via INTRATHECAL

## 2021-12-29 MED ORDER — DIPHENHYDRAMINE HCL 25 MG PO CAPS: 25.0000 mg | ORAL_CAPSULE | Freq: Four times a day (QID) | ORAL | Status: DC | PRN

## 2021-12-29 MED ORDER — SIMETHICONE 80 MG PO CHEW
80.0000 mg | CHEWABLE_TABLET | Freq: Three times a day (TID) | ORAL | Status: DC
  Administered 2021-12-29 – 2021-12-31 (×4): 80 mg via ORAL
  Filled 2021-12-29 (×4): qty 1

## 2021-12-29 MED ORDER — ACETAMINOPHEN 500 MG PO TABS
1000.0000 mg | ORAL_TABLET | Freq: Four times a day (QID) | ORAL | Status: DC
  Administered 2021-12-29 – 2021-12-31 (×7): 1000 mg via ORAL
  Filled 2021-12-29 (×7): qty 2

## 2021-12-29 MED ORDER — LACTATED RINGERS IV SOLN: INTRAVENOUS | Status: DC

## 2021-12-29 MED ORDER — SODIUM CHLORIDE 0.9% FLUSH: 3.0000 mL | INTRAVENOUS | Status: DC | PRN

## 2021-12-29 MED ORDER — FENTANYL CITRATE (PF) 100 MCG/2ML IJ SOLN
INTRAMUSCULAR | Status: AC
  Filled 2021-12-29: qty 2

## 2021-12-29 MED ORDER — NALOXONE HCL 0.4 MG/ML IJ SOLN: 0.4000 mg | INTRAMUSCULAR | Status: DC | PRN

## 2021-12-29 MED ORDER — DIPHENHYDRAMINE HCL 25 MG PO CAPS: 25.0000 mg | ORAL_CAPSULE | ORAL | Status: DC | PRN

## 2021-12-29 MED ORDER — ZOLPIDEM TARTRATE 5 MG PO TABS: 5.0000 mg | ORAL_TABLET | Freq: Every evening | ORAL | Status: DC | PRN

## 2021-12-29 MED ORDER — TETANUS-DIPHTH-ACELL PERTUSSIS 5-2.5-18.5 LF-MCG/0.5 IM SUSY: 0.5000 mL | PREFILLED_SYRINGE | Freq: Once | INTRAMUSCULAR | Status: DC

## 2021-12-29 MED ORDER — ONDANSETRON HCL 4 MG/2ML IJ SOLN: 4.0000 mg | Freq: Once | INTRAMUSCULAR | Status: DC | PRN

## 2021-12-29 MED ORDER — FENTANYL CITRATE (PF) 100 MCG/2ML IJ SOLN
INTRAMUSCULAR | Status: DC | PRN
Start: 2021-12-29 — End: 2021-12-29
  Administered 2021-12-29: 15 ug via INTRAVENOUS

## 2021-12-29 MED ORDER — SIMETHICONE 80 MG PO CHEW: 80.0000 mg | CHEWABLE_TABLET | ORAL | Status: DC | PRN

## 2021-12-29 MED ORDER — MENTHOL 3 MG MT LOZG: 1.0000 | LOZENGE | OROMUCOSAL | Status: DC | PRN

## 2021-12-29 MED ORDER — ONDANSETRON HCL 4 MG/2ML IJ SOLN
INTRAMUSCULAR | Status: DC | PRN
  Administered 2021-12-29: 4 mg via INTRAVENOUS

## 2021-12-29 MED ORDER — IBUPROFEN 600 MG PO TABS
600.0000 mg | ORAL_TABLET | Freq: Four times a day (QID) | ORAL | Status: DC
  Administered 2021-12-29 – 2021-12-31 (×7): 600 mg via ORAL
  Filled 2021-12-29 (×7): qty 1

## 2021-12-29 MED ORDER — OXYCODONE HCL 5 MG PO TABS
5.0000 mg | ORAL_TABLET | ORAL | Status: DC | PRN
  Administered 2021-12-30 – 2021-12-31 (×2): 5 mg via ORAL
  Filled 2021-12-29 (×2): qty 1

## 2021-12-29 MED ORDER — PRENATAL MULTIVITAMIN CH
1.0000 | ORAL_TABLET | Freq: Every day | ORAL | Status: DC
  Administered 2021-12-29 – 2021-12-30 (×2): 1 via ORAL
  Filled 2021-12-29 (×2): qty 1

## 2021-12-29 MED ORDER — SCOPOLAMINE 1 MG/3DAYS TD PT72: 1.0000 | MEDICATED_PATCH | Freq: Once | TRANSDERMAL | Status: DC

## 2021-12-29 MED ORDER — KETOROLAC TROMETHAMINE 30 MG/ML IJ SOLN: 30.0000 mg | Freq: Once | INTRAMUSCULAR | Status: DC | PRN

## 2021-12-29 MED ORDER — SODIUM CHLORIDE 0.9 % IR SOLN
Status: DC | PRN
  Administered 2021-12-29: 1000 mL

## 2021-12-29 MED ORDER — HYDROMORPHONE HCL 1 MG/ML IJ SOLN: 0.2500 mg | INTRAMUSCULAR | Status: DC | PRN

## 2021-12-29 MED ORDER — MORPHINE SULFATE (PF) 0.5 MG/ML IJ SOLN
INTRAMUSCULAR | Status: DC | PRN
  Administered 2021-12-29: 150 ug via INTRATHECAL

## 2021-12-29 MED ORDER — OXYCODONE HCL 5 MG PO TABS: 5.0000 mg | ORAL_TABLET | Freq: Once | ORAL | Status: DC | PRN

## 2021-12-29 MED ORDER — DEXAMETHASONE SODIUM PHOSPHATE 10 MG/ML IJ SOLN
INTRAMUSCULAR | Status: DC | PRN
  Administered 2021-12-29: 10 mg via INTRAVENOUS

## 2021-12-29 MED ORDER — OXYTOCIN-SODIUM CHLORIDE 30-0.9 UT/500ML-% IV SOLN
INTRAVENOUS | Status: DC | PRN
  Administered 2021-12-29: 300 mL via INTRAVENOUS

## 2021-12-29 MED ORDER — MEPERIDINE HCL 25 MG/ML IJ SOLN: 6.2500 mg | INTRAMUSCULAR | Status: DC | PRN

## 2021-12-29 MED ORDER — PHENYLEPHRINE HCL-NACL 20-0.9 MG/250ML-% IV SOLN
INTRAVENOUS | Status: DC | PRN
  Administered 2021-12-29: 60 ug/min via INTRAVENOUS

## 2021-12-29 MED ORDER — ACETAMINOPHEN 10 MG/ML IV SOLN
INTRAVENOUS | Status: DC | PRN
  Administered 2021-12-29: 1000 mg via INTRAVENOUS

## 2021-12-29 MED ORDER — DEXAMETHASONE SODIUM PHOSPHATE 10 MG/ML IJ SOLN
INTRAMUSCULAR | Status: AC
  Filled 2021-12-29: qty 1

## 2021-12-29 MED ORDER — ONDANSETRON HCL 4 MG/2ML IJ SOLN: 4.0000 mg | Freq: Three times a day (TID) | INTRAMUSCULAR | Status: DC | PRN

## 2021-12-29 MED ORDER — DIPHENHYDRAMINE HCL 50 MG/ML IJ SOLN: 12.5000 mg | INTRAMUSCULAR | Status: DC | PRN

## 2021-12-29 MED ORDER — OXYTOCIN-SODIUM CHLORIDE 30-0.9 UT/500ML-% IV SOLN
2.5000 [IU]/h | INTRAVENOUS | Status: AC
  Administered 2021-12-29: 2.5 [IU]/h via INTRAVENOUS
  Filled 2021-12-29: qty 500

## 2021-12-29 MED ORDER — ACETAMINOPHEN 10 MG/ML IV SOLN
INTRAVENOUS | Status: AC
  Filled 2021-12-29: qty 100

## 2021-12-29 MED ORDER — NALOXONE HCL 4 MG/10ML IJ SOLN: 1.0000 ug/kg/h | INTRAVENOUS | Status: DC | PRN

## 2021-12-29 MED ORDER — DIBUCAINE (PERIANAL) 1 % EX OINT: 1.0000 | TOPICAL_OINTMENT | CUTANEOUS | Status: DC | PRN

## 2021-12-29 MED ORDER — CEFAZOLIN SODIUM-DEXTROSE 2-4 GM/100ML-% IV SOLN
2.0000 g | INTRAVENOUS | Status: AC
  Administered 2021-12-29: 2 g via INTRAVENOUS

## 2021-12-29 MED ORDER — ONDANSETRON HCL 4 MG/2ML IJ SOLN
INTRAMUSCULAR | Status: AC
  Filled 2021-12-29: qty 2

## 2021-12-29 MED ORDER — OXYCODONE HCL 5 MG/5ML PO SOLN: 5.0000 mg | Freq: Once | ORAL | Status: DC | PRN

## 2021-12-29 MED ORDER — POVIDONE-IODINE 10 % EX SWAB
2.0000 | Freq: Once | CUTANEOUS | Status: AC
  Administered 2021-12-29: 2 via TOPICAL

## 2021-12-29 MED ORDER — SENNOSIDES-DOCUSATE SODIUM 8.6-50 MG PO TABS
2.0000 | ORAL_TABLET | Freq: Every day | ORAL | Status: DC
Start: 2021-12-30 — End: 2021-12-31
  Administered 2021-12-30 – 2021-12-31 (×2): 2 via ORAL
  Filled 2021-12-29 (×2): qty 2

## 2021-12-29 MED ORDER — OXYTOCIN-SODIUM CHLORIDE 30-0.9 UT/500ML-% IV SOLN
INTRAVENOUS | Status: AC
  Filled 2021-12-29: qty 500

## 2021-12-29 MED ORDER — CEFAZOLIN SODIUM-DEXTROSE 2-4 GM/100ML-% IV SOLN
INTRAVENOUS | Status: AC
  Filled 2021-12-29: qty 100

## 2021-12-29 MED ORDER — COCONUT OIL OIL: 1.0000 | TOPICAL_OIL | Status: DC | PRN

## 2021-12-29 MED ORDER — MORPHINE SULFATE (PF) 0.5 MG/ML IJ SOLN
INTRAMUSCULAR | Status: AC
  Filled 2021-12-29: qty 10

## 2021-12-29 SURGICAL SUPPLY — 35 items
ADH SKN CLS APL DERMABOND .7 (GAUZE/BANDAGES/DRESSINGS)
APL SKNCLS STERI-STRIP NONHPOA (GAUZE/BANDAGES/DRESSINGS) ×1
BENZOIN TINCTURE PRP APPL 2/3 (GAUZE/BANDAGES/DRESSINGS) ×3 IMPLANT
CHLORAPREP W/TINT 26ML (MISCELLANEOUS) ×6 IMPLANT
CLAMP CORD UMBIL (MISCELLANEOUS) ×3 IMPLANT
CLOTH BEACON ORANGE TIMEOUT ST (SAFETY) ×3 IMPLANT
DERMABOND ADVANCED (GAUZE/BANDAGES/DRESSINGS)
DERMABOND ADVANCED .7 DNX12 (GAUZE/BANDAGES/DRESSINGS) IMPLANT
DRSG OPSITE POSTOP 4X10 (GAUZE/BANDAGES/DRESSINGS) ×3 IMPLANT
ELECT REM PT RETURN 9FT ADLT (ELECTROSURGICAL) ×2
ELECTRODE REM PT RTRN 9FT ADLT (ELECTROSURGICAL) ×2 IMPLANT
EXTRACTOR VACUUM KIWI (MISCELLANEOUS) ×1 IMPLANT
GLOVE BIO SURGEON STRL SZ 6 (GLOVE) ×3 IMPLANT
GLOVE BIOGEL PI IND STRL 6 (GLOVE) ×4 IMPLANT
GLOVE BIOGEL PI IND STRL 7.0 (GLOVE) ×2 IMPLANT
GLOVE BIOGEL PI INDICATOR 6 (GLOVE) ×2
GLOVE BIOGEL PI INDICATOR 7.0 (GLOVE) ×1
GOWN STRL REUS W/TWL LRG LVL3 (GOWN DISPOSABLE) ×6 IMPLANT
KIT ABG SYR 3ML LUER SLIP (SYRINGE) ×2 IMPLANT
NDL HYPO 25X5/8 SAFETYGLIDE (NEEDLE) ×2 IMPLANT
NEEDLE HYPO 25X5/8 SAFETYGLIDE (NEEDLE) ×2 IMPLANT
NS IRRIG 1000ML POUR BTL (IV SOLUTION) ×3 IMPLANT
PACK C SECTION WH (CUSTOM PROCEDURE TRAY) ×3 IMPLANT
PAD OB MATERNITY 4.3X12.25 (PERSONAL CARE ITEMS) ×3 IMPLANT
STRIP CLOSURE SKIN 1/2X4 (GAUZE/BANDAGES/DRESSINGS) IMPLANT
SUT CHROMIC 0 CTX 36 (SUTURE) ×9 IMPLANT
SUT MON AB 2-0 CT1 27 (SUTURE) ×3 IMPLANT
SUT PDS AB 0 CT1 27 (SUTURE) ×1 IMPLANT
SUT PLAIN 0 NONE (SUTURE) IMPLANT
SUT PLAIN 2 0 XLH (SUTURE) ×1 IMPLANT
SUT VIC AB 0 CT1 36 (SUTURE) IMPLANT
SUT VIC AB 4-0 KS 27 (SUTURE) ×1 IMPLANT
TOWEL OR 17X24 6PK STRL BLUE (TOWEL DISPOSABLE) ×4 IMPLANT
TRAY FOLEY W/BAG SLVR 14FR LF (SET/KITS/TRAYS/PACK) ×1 IMPLANT
WATER STERILE IRR 1000ML POUR (IV SOLUTION) ×3 IMPLANT

## 2021-12-29 NOTE — Anesthesia Postprocedure Evaluation (Signed)
Anesthesia Post Note  Patient: DONNARAE RAE  Procedure(s) Performed: CESAREAN SECTION RPT X1 (Abdomen)     Patient location during evaluation: Mother Baby Anesthesia Type: Spinal Level of consciousness: oriented and awake and alert Pain management: pain level controlled Vital Signs Assessment: post-procedure vital signs reviewed and stable Respiratory status: spontaneous breathing and respiratory function stable Cardiovascular status: blood pressure returned to baseline and stable Postop Assessment: no headache, no backache, no apparent nausea or vomiting and able to ambulate Anesthetic complications: no   No notable events documented.  Last Vitals:  Vitals:   12/29/21 1005 12/29/21 1124  BP: 112/73 130/72  Pulse: 67 86  Resp: 18 16  Temp: 36.5 C 36.7 C  SpO2: 100% 100%    Last Pain:  Vitals:   12/29/21 1124  TempSrc: Oral  PainSc: 0-No pain   Pain Goal: Patients Stated Pain Goal: 4 (12/29/21 0853)                 Barnet Glasgow

## 2021-12-29 NOTE — Lactation Note (Signed)
This note was copied from a baby's chart. Lactation Consultation Note  Patient Name: Tonya [name] Date: 12/29/2021 Reason for consult: Maternal endocrine disorder;Initial assessment Age:36 hours  Encouraged sucking with gloved finger.  After baby began sucking, training done to encourage tongue movement.   Baby rooting to latch but unable to sustain latch due to thrusting the nipple from the mouth then sucking tongue. Laid back and cross cradle attempted.  Pre pumped with hand pump prior to latching.  LC encouraged STS, hand expression and offering the breast with cues.   Infant sleeping STS on mom's chest.   LC encouraged mom to call out for latch assist.  Support resources shared with mom.    Maternal Data Has patient been taught Hand Expression?: Yes Does the patient have breastfeeding experience prior to this delivery?: No (attempted with her son, Rolena Infante, age 62 but difficult latch/ low milk volume when pumping)  Feeding Mother's Current Feeding Choice: Breast Milk  LATCH Score Latch: Repeated attempts needed to sustain latch, nipple held in mouth throughout feeding, stimulation needed to elicit sucking reflex.  Audible Swallowing: None  Type of Nipple: Everted at rest and after stimulation (some edema present, reverse pressure softening used)  Comfort (Breast/Nipple): Soft / non-tender  Hold (Positioning): Assistance needed to correctly position infant at breast and maintain latch.  LATCH Score: 6   Lactation Tools Discussed/Used Tools: Pump Breast pump type: Manual Reason for Pumping: evert tissue, pre pump prior to latching  Interventions Interventions: Breast feeding basics reviewed;Assisted with latch;Skin to skin;Hand pump;Education;LC Services brochure  Discharge Pump: Manual  Consult Status Consult Status: Follow-up Date: 12/30/21 Follow-up type: In-patient    [facility] 12/29/2021, 2:15 PM

## 2021-12-29 NOTE — Anesthesia Procedure Notes (Signed)
Spinal  Patient location during procedure: OB Start time: 12/29/2021 7:37 AM End time: 12/29/2021 7:40 AM Reason for block: surgical anesthesia Staffing Performed: anesthesiologist  Anesthesiologist: Barnet Glasgow, MD Performed by: Barnet Glasgow, MD Authorized by: Barnet Glasgow, MD   Preanesthetic Checklist Completed: patient identified, IV checked, risks and benefits discussed, surgical consent, monitors and equipment checked, pre-op evaluation and timeout performed Spinal Block Patient position: sitting Prep: DuraPrep and site prepped and draped Patient monitoring: heart rate, cardiac monitor, continuous pulse ox and blood pressure Approach: midline Location: L3-4 Injection technique: single-shot Needle Needle type: Pencan  Needle gauge: 24 G Needle length: 10 cm Needle insertion depth: 6 cm Assessment Sensory level: T4 Events: CSF return Additional Notes  1 Attempt (s). Pt tolerated procedure well.

## 2021-12-29 NOTE — Transfer of Care (Signed)
Immediate Anesthesia Transfer of Care Note  Patient: Tonya [name]  Procedure(s) Performed: CESAREAN SECTION RPT X1 (Abdomen)  Patient Location: PACU  Anesthesia Type:Spinal  Level of Consciousness: awake  Airway & Oxygen Therapy: Patient Spontanous Breathing  Post-op Assessment: Report given to RN  Post vital signs: Reviewed and stable  Last Vitals:  Vitals Value Taken Time  BP 109/64 12/29/21 0853  Temp    Pulse 72 12/29/21 0856  Resp 18 12/29/21 0856  SpO2 94 % 12/29/21 0856  Vitals shown include unvalidated device data.  Last Pain:  Vitals:   12/29/21 0552  TempSrc: Oral  PainSc: 0-No pain         Complications: No notable events documented.

## 2021-12-29 NOTE — Lactation Note (Signed)
This note was copied from a baby's chart. Lactation Consultation Note  Patient Name: Girl Retina Bernardy [id number] Date: 12/29/2021   Age:36 hours  Mom worked with lactation prior to [facility] arrival. [facility] provided list of [facility] employee pumps to choose from. Mom to call when she is ready to make a selection.  All questions answered at the end of the visit.   Maternal Data    Feeding    LATCH Score                    Lactation Tools Discussed/Used    Interventions    Discharge    Consult Status      Arber Wiemers  Nicholson-Springer 12/29/2021, 1:22 PM

## 2021-12-29 NOTE — Op Note (Signed)
Tonya [name] PROCEDURE DATE: 12/29/2021  PREOPERATIVE DIAGNOSIS: Intrauterine pregnancy at  34w2dweeks gestation, desire for repeat  POSTOPERATIVE DIAGNOSIS: The same  PROCEDURE: Repeat  Low Transverse Cesarean Section  SURGEON:  Dr. MLinda Hedges INDICATIONS: Tonya [name] a 36y.o. G2P1001 at 368w2dcheduled for cesarean section secondary to desire for repeat.  The risks of cesarean section discussed with the patient included but were not limited to: bleeding which may require transfusion or reoperation; infection which may require antibiotics; injury to bowel, bladder, ureters or other surrounding organs; injury to the fetus; need for additional procedures including hysterectomy in the event of a life-threatening hemorrhage; placental abnormalities wth subsequent pregnancies, incisional problems, thromboembolic phenomenon and other postoperative/anesthesia complications. The patient concurred with the proposed plan, giving informed written consent for the procedure.    FINDINGS:  Viable female infant in cephalic presentation, APGARs 8,9: weight pending  Clear amniotic fluid.  Intact placenta, three vessel cord.  Grossly normal uterus, ovaries and fallopian tubes. .   ANESTHESIA:  Spinal ESTIMATED BLOOD LOSS: 339 ml SPECIMENS: Placenta sent to L&D COMPLICATIONS: None immediate  PROCEDURE IN DETAIL:  The patient received intravenous antibiotics and had sequential compression devices applied to her lower extremities while in the preoperative area.  She was then taken to the operating room where spinal anesthesia was administered and was found to be adequate. She was then placed in a dorsal supine position with a leftward tilt, and prepped and draped in a sterile manner.  A foley catheter was placed into her bladder and attached to constant gravity.  After an adequate timeout was performed, a Pfannenstiel skin incision was made with scalpel and carried through to the underlying  layer of fascia. The fascia was incised in the midline and this incision was extended bilaterally using the Mayo scissors. Kocher clamps were applied to the superior aspect of the fascial incision and the underlying rectus muscles were dissected off bluntly. A similar process was carried out on the inferior aspect of the facial incision. The rectus muscles were separated in the midline bluntly and the peritoneum was entered bluntly.  Bladder flap was created sharply and developed bluntly.  Bladder blade was placed.  A transverse hysterotomy was made with a scalpel and extended bilaterally bluntly. The bladder blade was then removed. The infant was successfully delivered using a single Kiwi vacuum pull, and cord was clamped and cut and infant was handed over to awaiting neonatology team. Uterine massage was then administered and the placenta delivered intact with three-vessel cord. The uterus was cleared of clot and debris.  The hysterotomy was closed with 0 chromic.  A second imbricating suture of 0-chromic was used to reinforce the incision and aid in hemostasis.  The peritoneum and rectus muscles were noted to be hemostatic and were reapproximated using 2-0 monocryl in a running fashion.  The fascia was closed with 0-PDS in a running fashion with good restoration of anatomy.  The subcutaneus tissue was copiously irrigated and was reapproximated using 3 interrupted plain gut stitches.  The skin was closed with 4-0 vicryl in a subcuticular fashion.  Pt tolerated the procedure will.  All counts were correct x2.  Pt went to the recovery room in stable condition.

## 2021-12-29 NOTE — Lactation Note (Signed)
This note was copied from a baby's chart. Lactation Consultation Note  Patient Name: Tonya [name] [id number] Date: 12/29/2021 Reason for consult: Follow-up assessment;Term;Breastfeeding assistance Age:36 hours  P2, Term, Infant Female  LC entered the room and baby was next to mom. Mom states that feedings have gotten better, but baby is still not feeding for very long. LC encouraged mom to try sandwiching her breast tissue to get a deeper latch and pulling down baby's chin.   Mom states that she has a [employer] pump at home and asked about pumping before returning to work. All questions were answered.   Mom states that she has no further questions or concerns and will call for latch assistance.   Lactation Tools Discussed/Used    Interventions Interventions: Education;Breast feeding basics reviewed  Discharge    Consult Status Consult Status: Follow-up Date: 12/30/21 Follow-up type: In-patient    [name] 12/29/2021, 9:05 PM

## 2021-12-29 NOTE — Progress Notes (Signed)
No change to H&P.  Cheryle Dark, DO 

## 2021-12-30 LAB — CBC
HCT: 35.2 % — ABNORMAL LOW (ref 36.0–46.0)
Hemoglobin: 11.6 g/dL — ABNORMAL LOW (ref 12.0–15.0)
MCH: 31.1 pg (ref 26.0–34.0)
MCHC: 33 g/dL (ref 30.0–36.0)
MCV: 94.4 fL (ref 80.0–100.0)
Platelets: 145 10*3/uL — ABNORMAL LOW (ref 150–400)
RBC: 3.73 MIL/uL — ABNORMAL LOW (ref 3.87–5.11)
RDW: 13.9 % (ref 11.5–15.5)
WBC: 13.1 10*3/uL — ABNORMAL HIGH (ref 4.0–10.5)
nRBC: 0 % (ref 0.0–0.2)

## 2021-12-30 LAB — BIRTH TISSUE RECOVERY COLLECTION (PLACENTA DONATION)

## 2021-12-30 MED ORDER — BUPROPION HCL ER (XL) 150 MG PO TB24
150.0000 mg | ORAL_TABLET | Freq: Every day | ORAL | Status: DC
  Administered 2021-12-30 – 2021-12-31 (×2): 150 mg via ORAL
  Filled 2021-12-30 (×2): qty 1

## 2021-12-30 NOTE — Lactation Note (Signed)
This note was copied from a baby's chart. Lactation Consultation Note  Patient Name: Tonya [name] [id number] Date: 12/30/2021 Reason for consult: Follow-up assessment;Term;1st time breastfeeding Age:36 hours   LC Note:  Mother called for lactation assistance.  "[name]" was showing cues and frantic when I arrived.  Assisted to latch, however, she became frustrated and was not interested in calming down to latch.  Allowed her to suck on my gloved finger to calm her; palate high and "[name]" has a tight suck.  Her tongue was elevated and she had a difficult time maintaining a grasp due to her frantic disposition.  Mother anxious.  Suggested we supplement to calm her and try latching again.  Mother in agreement.  Options presented and mother chose formula.  Taught mother paced bottle feeding and "[name]" consumed 5 mls with a little difficulty maintaining a grasp.  Burped and attempted to latch again without success.  By this time she was tired and fell asleep.  Placed her STS on mother's chest where she lay calm.  Mother happy to see her calm and satisfied.   Provided breast shells with instructions for use and placed inside mother's bra.  Reassurance given.  Suggested she continue to do lots of STS, breast massage and hand expression.  She will call her RN/LC for latch assistance as needed.    Father and grandfather present in the hospital; not in the room at this time. RN updated.   Maternal Data Has patient been taught Hand Expression?: Yes Does the patient have breastfeeding experience prior to this delivery?: No  Feeding Mother's Current Feeding Choice: Breast Milk and Formula Nipple Type: Slow - flow  LATCH Score Latch: Repeated attempts needed to sustain latch, nipple held in mouth throughout feeding, stimulation needed to elicit sucking reflex.  Audible Swallowing: None  Type of Nipple: Everted at rest and after stimulation (Short shafted)  Comfort (Breast/Nipple): Soft /  non-tender  Hold (Positioning): Assistance needed to correctly position infant at breast and maintain latch.  LATCH Score: 6   Lactation Tools Discussed/Used Tools: Shells Breast pump type: Manual Reason for Pumping: Breast stimulation; nipple eversion Pumping frequency: prn  Interventions Interventions: Breast feeding basics reviewed;Assisted with latch;Skin to skin;Breast massage;Hand express;Breast compression;Hand pump;Shells;Position options;Support pillows;Adjust position;Education  Discharge Pump: Hands Free [facility])  Consult Status Consult Status: Follow-up Date: 12/31/21 Follow-up type: In-patient    Teckla Christiansen R Eyana Stolze 12/30/2021, 9:54 AM

## 2021-12-30 NOTE — Progress Notes (Signed)
POD # 1  Patient doing well.Requesting some medication to assist with anxiety and depression. Previously took prozac and zoloft and it did not help. BP 133/73 (BP Location: Left Arm)   Pulse 82   Temp 98.4 F (36.9 C) (Oral)   Resp 17   Ht '5\' 5"'$  (1.651 m)   Wt 103.9 kg   LMP 01/06/2021   SpO2 98%   Breastfeeding Unknown   BMI 38.12 kg/m  Results for orders placed or performed during the hospital encounter of 12/29/21 (from the past 24 hour(s))  CBC     Status: Abnormal   Collection Time: 12/30/21  5:22 AM  Result Value Ref Range   WBC 13.1 (H) 4.0 - 10.5 K/uL   RBC 3.73 (L) 3.87 - 5.11 MIL/uL   Hemoglobin 11.6 (L) 12.0 - 15.0 g/dL   HCT 35.2 (L) 36.0 - 46.0 %   MCV 94.4 80.0 - 100.0 fL   MCH 31.1 26.0 - 34.0 pg   MCHC 33.0 30.0 - 36.0 g/dL   RDW 13.9 11.5 - 15.5 %   Platelets 145 (L) 150 - 400 K/uL   nRBC 0.0 0.0 - 0.2 %  Collect bld for placenta donatation     Status: None   Collection Time: 12/30/21  5:22 AM  Result Value Ref Range   Placenta donation bld collect Collected by Laboratory    Abdomen uterus is firm Lochia WNL POD # 1 Doing well Routine care  Start wellbutrin  -patient agreeable Discharge home tomorrow

## 2021-12-30 NOTE — Social Work (Addendum)
CSW received consult for [place] of 10 and hx of anxiety CSW met with MOB to offer support and complete assessment.    CSW met with MOB bedside. CSW introduced role CSW observed MOB up in the room and infant was asleep in the bedside bassinet. CSW inquired abut how MOB was feeling for the past 7 days and during pregnancy. MOB stated she was feeling good just anxious. MOB stated " no depression just severe anxiety". MOB explained that she has been dealing with anxiety for "awhile". CSW inquired about treatment or medication that MOB has been prescribed. MOB explained that she has never had therapy but talks to her OB regularly regarding her mood. MOB explained that she has taken Prozac in the past but it caused  her to gain "a ton of weight and didn't really help". CSW inquired about how MOB has been managing her anxiety in the past. MOB explained she was taking PRN Xanax but did not want to take it while pregnant so weaned herself off of it. MOB reported that she was just prescribed Wellbutrin to manage her anxiety and will monitor her symptoms. MOB inquired about local psychiatry recommendations. CSW provided MOB with [facility] information as an additional resource.    CSW provided education regarding the baby blues period vs. perinatal mood disorders, discussed treatment and gave resources for mental health follow up if concerns arise.  CSW recommends self-evaluation during the postpartum time period using the New Mom Checklist from Postpartum Progress and encouraged MOB to contact a medical professional if symptoms are noted at any time.  MOB reported that she has all necessary items for the infant. CSW provided review of Sudden Infant Death Syndrome (SIDS) precautions. MOB identified Dr Louanne Skye with [facility] [place] [facility] for infants follow up care.  CSW identifies no further need for intervention and no barriers to discharge at this time.   Letta Kocher,  [facility] [place]

## 2021-12-30 NOTE — Lactation Note (Signed)
This note was copied from a baby's chart. Lactation Consultation Note  Patient Name: Tonya [name] [id number] Date: 12/30/2021 Reason for consult: Follow-up assessment;Term;1st time breastfeeding Age:36 hours   P2 mother whose infant is now 57 hours old.  This is a term baby at 39+2 weeks.  Mother's current feeding preference is breast.  Baby "Tonya [name]" was asleep next to mother when I arrived.  She recently fed a few colostrum drops.  Mother concerned that she has not been able to express much and "Tonya [name]" has not latched and fed well yet.  Reassurance given.  Reviewed breast feeding basics and offered to return at the next feeding for assistance.  Mother appreciative and will call for help.  Further education to be provided at this next visit.  Father present and asleep on the couch.   Maternal Data Has patient been taught Hand Expression?: Yes Does the patient have breastfeeding experience prior to this delivery?: No  Feeding Mother's Current Feeding Choice: Breast Milk  LATCH Score                    Lactation Tools Discussed/Used Breast pump type: Manual Reason for Pumping: Breast stimulation Pumping frequency: prn  Interventions Interventions: Education;Breast feeding basics reviewed  Discharge Pump: Hands Free [facility])  Consult Status Consult Status: Follow-up Date: 12/31/21 Follow-up type: In-patient    Lene Mckay R Sabrie Moritz 12/30/2021, 8:03 AM

## 2021-12-31 MED ORDER — BUPROPION HCL ER (XL) 150 MG PO TB24: 150.0000 mg | ORAL_TABLET | Freq: Every day | ORAL | 1 refills | Status: DC

## 2021-12-31 MED ORDER — OXYCODONE HCL 5 MG PO TABS: 5.0000 mg | ORAL_TABLET | ORAL | 0 refills | Status: DC | PRN

## 2021-12-31 NOTE — Discharge Summary (Signed)
Postpartum Discharge Summary  Date of Service December 31, 2021     Patient Name: Tonya [name] DOB: May 26, 1986 MRN: [id number]  Date of admission: 12/29/2021 Delivery date:12/29/2021  Delivering provider: [name]  Date of discharge: 12/31/2021  Admitting diagnosis: Previous cesarean section [Z98.891] S/P cesarean section [Z98.891] Intrauterine pregnancy: [redacted]w[redacted]d    Secondary diagnosis:  Principal Problem:   Previous cesarean section Active Problems:   S/P cesarean section  Additional problems: none    Discharge diagnosis: Term Pregnancy Delivered                                              Post partum procedures: none Augmentation: N/A Complications: None  Hospital course: Sceduled C/S   36y.o. yo G2P2002 at 358w2das admitted to the hospital 12/29/2021 for scheduled cesarean section with the following indication:Elective Repeat.Delivery details are as follows:  Membrane Rupture Time/Date: 8:04 AM ,12/29/2021   Delivery Method:C-Section, Vacuum Assisted  Details of operation can be found in separate operative note.  Patient had an uncomplicated postpartum course.  She is ambulating, tolerating a regular diet, passing flatus, and urinating well. Patient is discharged home in stable condition on  12/31/21        Newborn Data: Birth date:12/29/2021  Birth time:8:06 AM  Gender:Female  Living status:Living  Apgars:8 ,9  Weight:3320 g     Magnesium Sulfate received: No BMZ received: No Rhophylac:N/A MMR:N/A T-DaP:Given prenatally Flu: N/A Transfusion:No  Physical exam  Vitals:   12/29/21 2130 12/30/21 0130 12/30/21 2135 12/31/21 0600  BP: 121/70 133/73 118/72 108/70  Pulse: 76 82 96 87  Resp: _0 Temp: 98 F (36.7 C) 98.4 F (36.9 C) 97.7 F (36.5 C) 97.7 F (36.5 C)  TempSrc: Oral Oral Oral Oral  SpO2: 98% 98% 97% 99%  Weight:      Height:       General: alert, cooperative, and no distress Lochia: appropriate Uterine Fundus: firm Incision:  Healing well with no significant drainage DVT Evaluation: No evidence of DVT seen on physical exam. Labs: Lab Results  Component Value Date   WBC 13.1 (H) 12/30/2021   HGB 11.6 (L) 12/30/2021   HCT 35.2 (L) 12/30/2021   MCV 94.4 12/30/2021   PLT 145 (L) 12/30/2021      Latest Ref Rng & Units 10/28/2020    2:45 PM  CMP  Glucose 65 - 99 mg/dL 91   BUN 7 - 25 mg/dL 14   Creatinine 0.50 - 1.10 mg/dL 0.81   Sodium 135 - 146 mmol/L 139   Potassium 3.5 - 5.3 mmol/L 4.0   Chloride 98 - 110 mmol/L 104   CO2 20 - 32 mmol/L 25   Calcium 8.6 - 10.2 mg/dL 9.4   Total Protein 6.1 - 8.1 g/dL 6.9   Total Bilirubin 0.2 - 1.2 mg/dL 0.7   AST 10 - 30 U/L 17   ALT 6 - 29 U/L 24    Edinburgh Score:    12/29/2021   10:30 AM  Edinburgh Postnatal Depression Scale Screening Tool  I have been able to laugh and see the funny side of things. 0  I have looked forward with enjoyment to things. 0  I have blamed myself unnecessarily when things went wrong. 2  I have been anxious or worried for no good reason. 3  I  have felt scared or panicky for no good reason. 2  Things have been getting on top of me. 2  I have been so unhappy that I have had difficulty sleeping. 0  I have felt sad or miserable. 1  I have been so unhappy that I have been crying. 0  The thought of harming myself has occurred to me. 0  Edinburgh Postnatal Depression Scale Total 10      After visit meds:  Allergies as of 12/31/2021   No Known Allergies      Medication List     TAKE these medications    acetaminophen 325 MG tablet Commonly known as: TYLENOL Take 650 mg by mouth every 6 (six) hours as needed for mild pain or moderate pain.   amphetamine-dextroamphetamine 20 MG tablet Commonly known as: ADDERALL Take  1 to 2 tablets  Daily  for ADD, not to exceed 5 days a week   buPROPion 150 MG 24 hr tablet Commonly known as: WELLBUTRIN XL Take 1 tablet (150 mg total) by mouth daily.   oxyCODONE 5 MG immediate release  tablet Commonly known as: Oxy IR/ROXICODONE Take 1-2 tablets (5-10 mg total) by mouth every 4 (four) hours as needed for moderate pain.   prenatal multivitamin Tabs tablet Take 1 tablet by mouth daily at 12 noon.   Vitamin D 50 MCG (2000 UT) tablet Take 2,000 Units by mouth daily.         Discharge home in stable condition Infant Feeding: Breast Infant Disposition:home with mother Discharge instruction: per After Visit Summary and Postpartum booklet. Activity: Advance as tolerated. Pelvic rest for 6 weeks.  Diet: routine diet Anticipated Birth Control: Unsure Postpartum Appointment:1 week Additional Postpartum F/U: Incision check 1 week Future Appointments: Future Appointments  Date Time Provider [place]  05/25/2022 10:00 AM Alycia Rossetti, NP [facility] None   Follow up Visit:      12/31/2021 Cyril Mourning, MD

## 2022-01-01 IMAGING — CT CT ABD-PELV W/ CM
2 of 4 series · 16 of 46 positions shown, 18 images · IV contrast (omnipaque)
Comparison: None.

CLINICAL DATA: Pt reports upper abdominal pain that has been
ongoing for the last 4 months with no results from dietary changes
and has had endoscopy and biopsy with no results. Pt denies any
nausea, vomiting, or diarrhea. ^100mL OMNIPAQUE IOHEXOL 300 MG/ML
SOLNPancreatitis suspected

EXAM:
CT ABDOMEN AND PELVIS WITH CONTRAST
TECHNIQUE: Multidetector CT imaging of the abdomen and pelvis was performed
using the standard protocol following bolus administration of
intravenous contrast.
CONTRAST:  100mL OMNIPAQUE IOHEXOL 300 MG/ML  SOLN

[Series 2: axial st · axial · 0.79mm/px · z∈[-515,-90]mm · 13 of 95 slices shown, 15 images]
[im 5/95  soft-tissue]
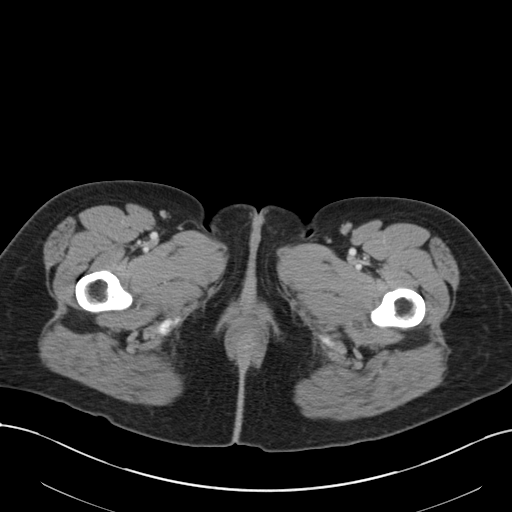
[im 5/95  bone]
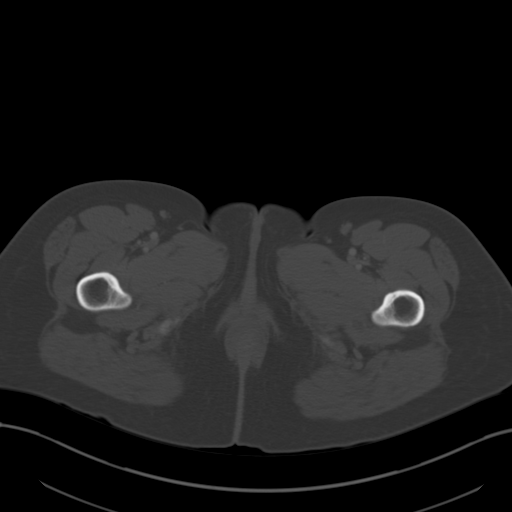
[im 15/95  soft-tissue]
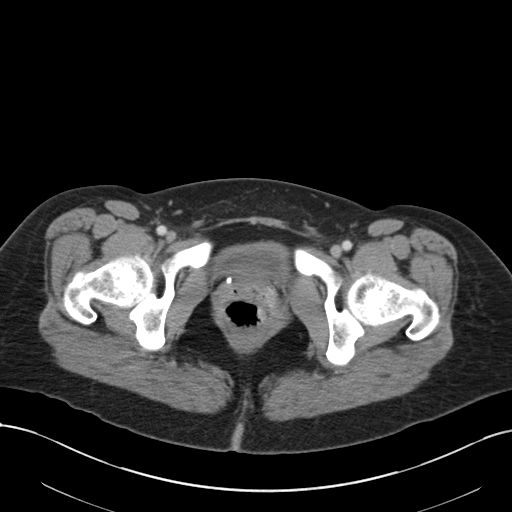
[im 20/95  soft-tissue]
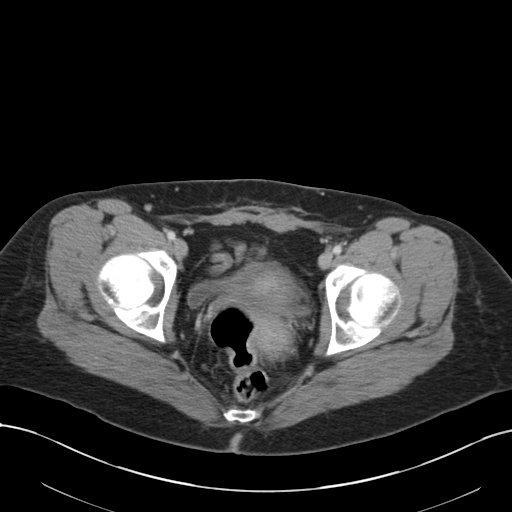
[im 25/95  soft-tissue]
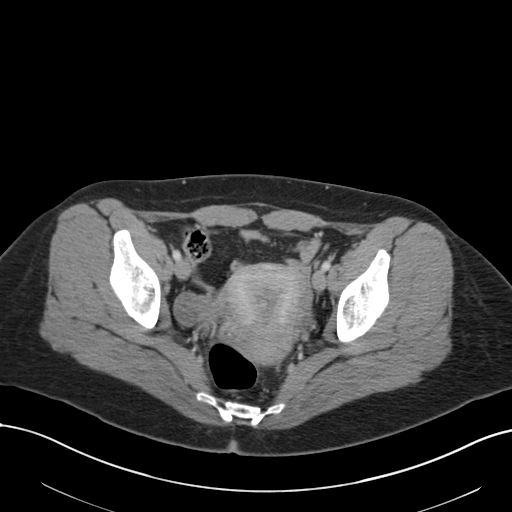
[im 35/95  soft-tissue]
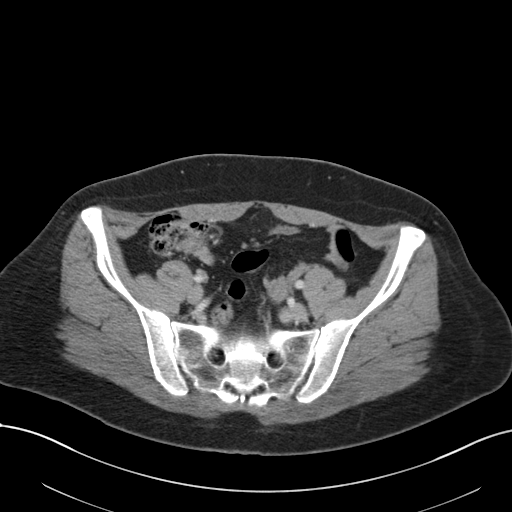
[im 40/95  soft-tissue]
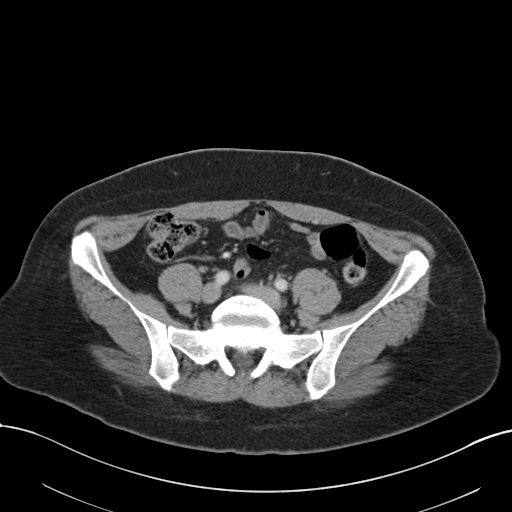
[im 50/95  soft-tissue]
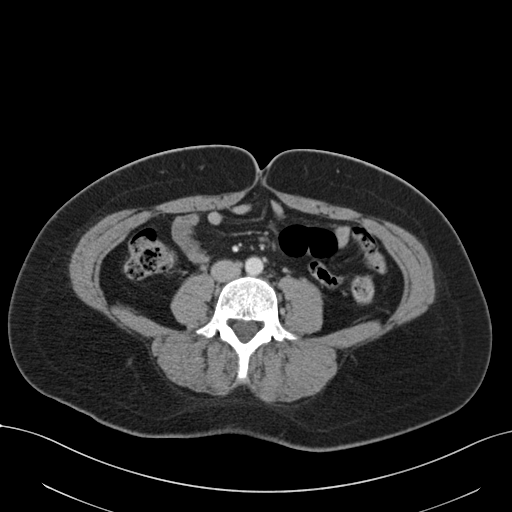
[im 55/95  soft-tissue]
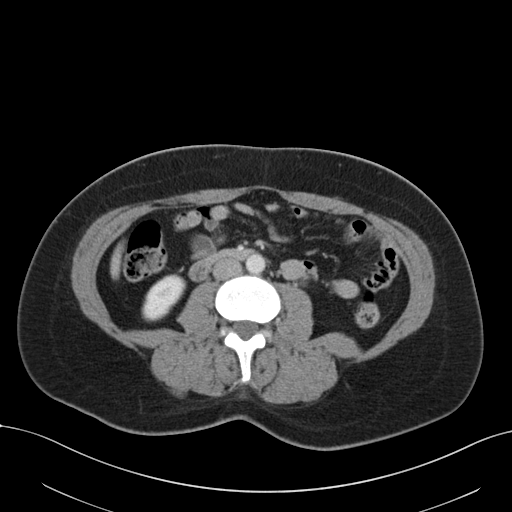
[im 60/95  soft-tissue]
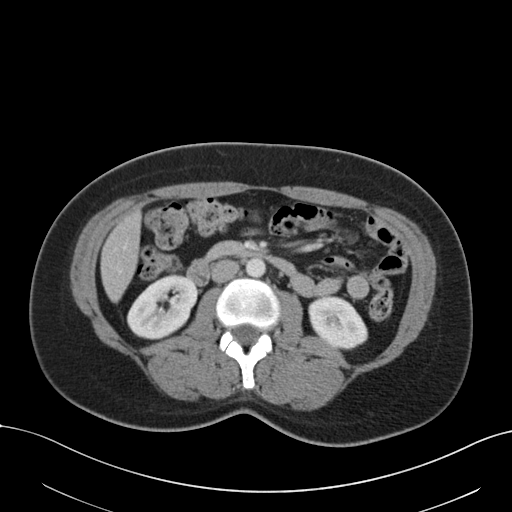
[im 60/95  bone]
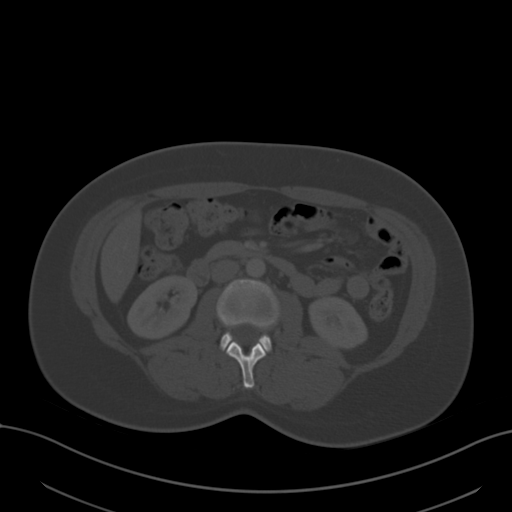
[im 70/95  soft-tissue]
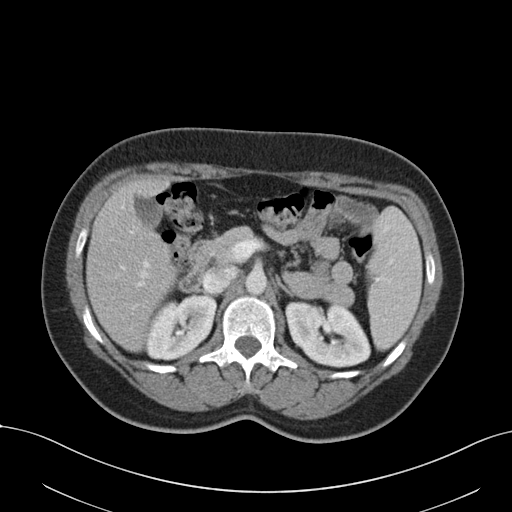
[im 75/95  soft-tissue]
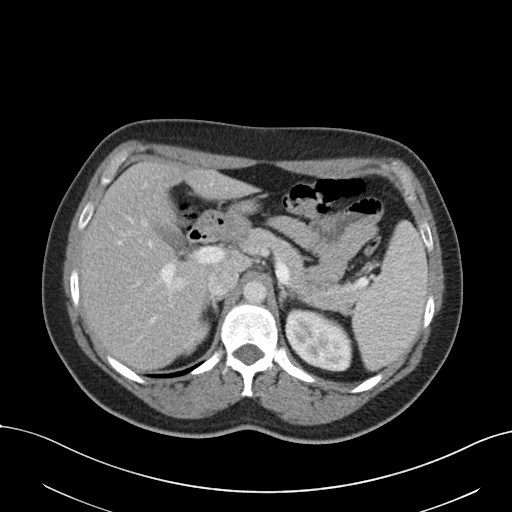
[im 80/95  soft-tissue]
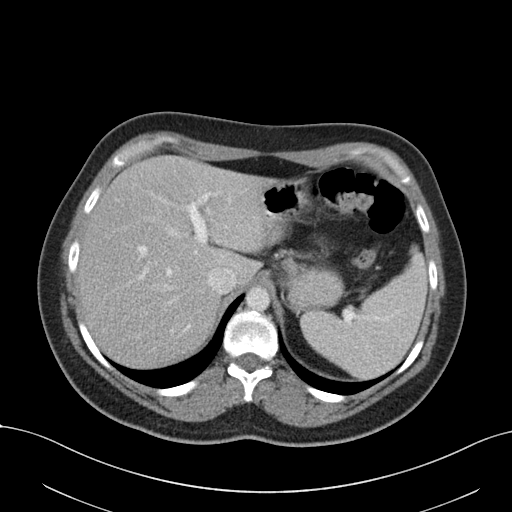
[im 90/95  soft-tissue]
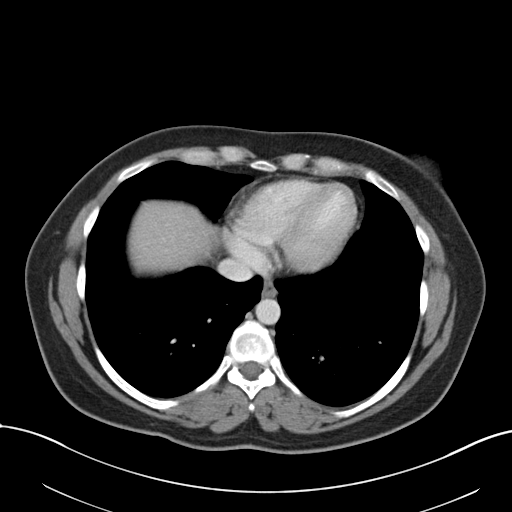

[Series 5: coronal st · coronal · 0.78mm/px · 3 of 150 slices shown]
[im 50/150  soft-tissue]
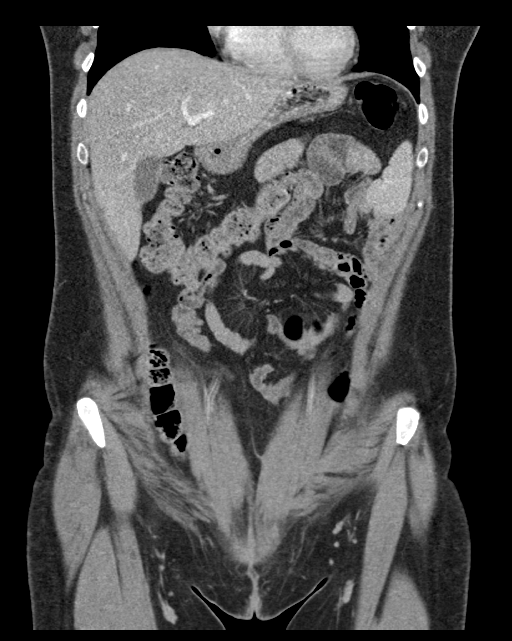
[im 67/150  soft-tissue]
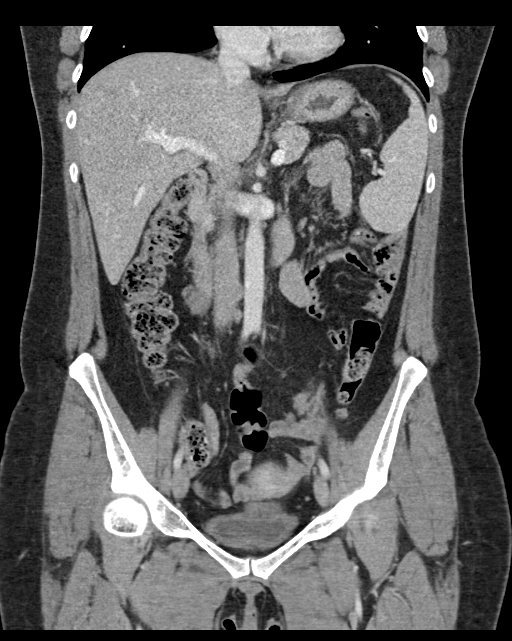
[im 83/150  soft-tissue]
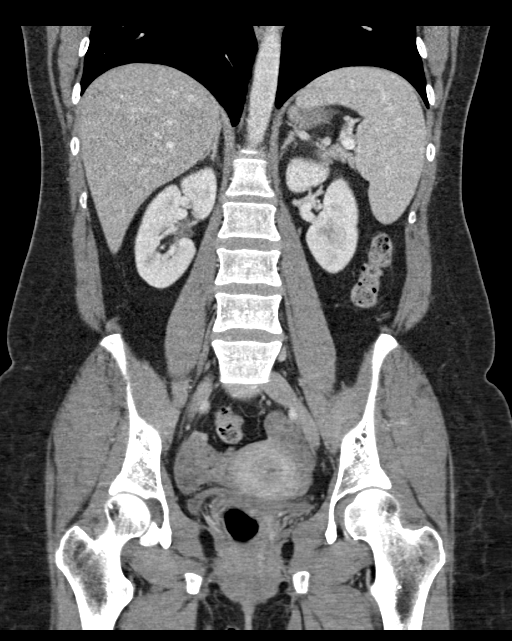

[16 of 46 positions shown; findings below may reference images not displayed]

FINDINGS: Lower chest: Lung bases are clear.

Hepatobiliary: No focal hepatic lesion. No biliary duct dilatation.
Common bile duct is normal.

Pancreas: Pancreas is normal. No ductal dilatation. No pancreatic
inflammation.

Spleen: Normal spleen

Adrenals/urinary tract: Adrenal glands and kidneys are normal. The
ureters and bladder normal.

Stomach/Bowel: Stomach, small bowel, appendix, and cecum are normal.
The colon and rectosigmoid colon are normal.

Vascular/Lymphatic: Abdominal aorta is normal caliber. No periportal
or retroperitoneal adenopathy. No pelvic adenopathy.

Reproductive: Uterus and adnexa unremarkable.

Other: No free fluid.

Musculoskeletal: No aggressive osseous lesion.
IMPRESSION: 1. No acute findings in the abdomen pelvis.
2. Normal pancreas.

## 2022-01-02 ENCOUNTER — Telehealth (HOSPITAL_COMMUNITY): Payer: Self-pay | Admitting: *Deleted

## 2022-01-02 NOTE — Telephone Encounter (Signed)
Hospital EPDS score = 10. Information faxed to delivering provider, Dr. [name]. Erline Levine, RN, 01/02/22, [phone]

## 2022-01-06 ENCOUNTER — Other Ambulatory Visit (HOSPITAL_COMMUNITY): Payer: Self-pay

## 2022-01-06 MED ORDER — LIDOCAINE 5 % EX PTCH
MEDICATED_PATCH | CUTANEOUS | 0 refills | Status: DC
  Filled 2022-01-06: qty 30, 30d supply, fill #0

## 2022-01-16 ENCOUNTER — Other Ambulatory Visit (HOSPITAL_COMMUNITY): Payer: Self-pay

## 2022-01-23 ENCOUNTER — Other Ambulatory Visit (HOSPITAL_COMMUNITY): Payer: Self-pay

## 2022-02-01 DIAGNOSIS — M5451 Vertebrogenic low back pain: Secondary | ICD-10-CM | POA: Diagnosis not present

## 2022-02-15 DIAGNOSIS — Z1389 Encounter for screening for other disorder: Secondary | ICD-10-CM | POA: Diagnosis not present

## 2022-02-15 DIAGNOSIS — F53 Postpartum depression: Secondary | ICD-10-CM | POA: Diagnosis not present

## 2022-02-15 DIAGNOSIS — Z1151 Encounter for screening for human papillomavirus (HPV): Secondary | ICD-10-CM | POA: Diagnosis not present

## 2022-02-15 DIAGNOSIS — Z124 Encounter for screening for malignant neoplasm of cervix: Secondary | ICD-10-CM | POA: Diagnosis not present

## 2022-02-15 LAB — HM PAP SMEAR
HM Pap smear: NORMAL
HPV, high-risk: NEGATIVE

## 2022-02-15 LAB — RESULTS CONSOLE HPV: CHL HPV: NEGATIVE

## 2022-03-02 DIAGNOSIS — Z135 Encounter for screening for eye and ear disorders: Secondary | ICD-10-CM | POA: Diagnosis not present

## 2022-03-02 DIAGNOSIS — H52223 Regular astigmatism, bilateral: Secondary | ICD-10-CM | POA: Diagnosis not present

## 2022-03-07 ENCOUNTER — Encounter: Payer: Self-pay | Admitting: Nurse Practitioner

## 2022-03-07 DIAGNOSIS — G4452 New daily persistent headache (NDPH): Secondary | ICD-10-CM

## 2022-03-08 NOTE — Progress Notes (Signed)
Assessment and Plan:  Tonya [name] was seen today for acute visit.  Diagnoses and all orders for this visit:  Abnormal glucose/History of gestational diabetes Continue focusing on diet, exercise and weight loss -     Semaglutide,0.25 or 0.'5MG'$ /DOS, 2 MG/3ML SOPN; Inject 0.25 mg into the skin once a week.   Attention deficit disorder (ADD) without hyperactivity Restart Adderall BID as needed Focus on diet and exercise as well as good sleep hygiene since working nights -     amphetamine-dextroamphetamine (ADDERALL) 20 MG tablet; Take  1 to 2 tablets  Daily  for ADD  Obesity (BMI 30.0-34.9) Long discussion about weight loss, diet, and exercise Recommended diet heavy in fruits and veggies and low in animal meats, cheeses, and dairy products, appropriate calorie intake Patient will work on decreasing saturated fats and simple carbs Follow up at next visit  Anxiety Had side effects related to Wellbutrin so plan to wean off Use Buspar 5 mg BID as needed Continue diet exercise and practice good sleep hygiene Relaxation techniques If symptoms are not controlled with Buspar notify the office  Headache/blurred vision Wean off Wellbutrin , take every other day for 1 week and then stop If headaches and vision changes do not resolve notify the office and will plan further work up Had recent eye exam with new contacts- not related to vision    Further disposition pending results of labs. Discussed med's effects and SE's.   Over 30 minutes of exam, counseling, chart review, and critical decision making was performed.   Future Appointments  Date Time Provider [place]  05/25/2022 10:00 AM Tonya Rossetti, NP [facility] None    ------------------------------------------------------------------------------------------------------------------   HPI BP 134/82   Pulse 93   Temp 97.9 F (36.6 C)   Ht '5\' 5"'$  (1.651 m)   Wt 197 lb 3.2 oz (89.4 kg)   LMP 01/06/2021   SpO2 95%    Breastfeeding No   BMI 32.82 kg/m   35 y.o.female presents for headache and blurred vision which have started occurring since initiation of Wellbutrin by OB/GYN 9 weeks ago. Started taking the day after she was born for postpartum blues.  After about 5 weeks she started having blurred vision and headaches. Describes headaches as a dull ache that occur all day and Ibuprofen or Tylenol does not relieve headache. She was also given Buspar but had not started. She stopped breastfeeding and would like to start back on her   She has had headache and questioning elevation of BP since starting Wellbutrin BP Readings from Last 3 Encounters:  03/09/22 134/82  12/31/21 108/70  01/14/21 118/82   BMI is Body mass index is 32.82 kg/m., she has been working on diet and exercise. She is 10 weeks postpartum and did have gestational diabetes with her pregnancies.  She is very interested in weight loss. Cannot take Phentermine as she is on Adderall for ADD Wt Readings from Last 3 Encounters:  03/09/22 197 lb 3.2 oz (89.4 kg)  12/29/21 229 lb 1.6 oz (103.9 kg)  12/06/21 225 lb (102.1 kg)      Past Medical History:  Diagnosis Date   ADD (attention deficit disorder)    Anxiety    Gestational diabetes    Hepatic steatosis 01/14/2020   Hyperlipidemia    S/P cesarean section 12/03/2016     No Known Allergies  Current Outpatient Medications on File Prior to Visit  Medication Sig   acetaminophen (TYLENOL) 325 MG tablet Take 650 mg by mouth every  6 (six) hours as needed for mild pain or moderate pain.   amphetamine-dextroamphetamine (ADDERALL) 20 MG tablet Take  1 to 2 tablets  Daily  for ADD, not to exceed 5 days a week   buPROPion (WELLBUTRIN XL) 150 MG 24 hr tablet Take 1 tablet (150 mg total) by mouth daily.   Cholecalciferol (VITAMIN D) 50 MCG (2000 UT) tablet Take 2,000 Units by mouth daily.   lidocaine (LIDODERM) 5 % APPLY 1 PATCH TOPICALLY ONCE DAILY (MAY WEAR UP TO 12 HOURS.)   oxyCODONE (OXY  IR/ROXICODONE) 5 MG immediate release tablet Take 1-2 tablets (5-10 mg total) by mouth every 4 (four) hours as needed for moderate pain.   Prenatal Vit-Fe Fumarate-FA (PRENATAL MULTIVITAMIN) TABS tablet Take 1 tablet by mouth daily at 12 noon.   No current facility-administered medications on file prior to visit.    ROS: all negative except above.   Physical Exam:  BP 134/82   Pulse 93   Temp 97.9 F (36.6 C)   Ht '5\' 5"'$  (1.651 m)   Wt 197 lb 3.2 oz (89.4 kg)   LMP 01/06/2021   SpO2 95%   Breastfeeding No   BMI 32.82 kg/m   General Appearance: Well nourished, in no apparent distress. Eyes: PERRLA, EOMs, conjunctiva no swelling or erythema Sinuses: No Frontal/maxillary tenderness ENT/Mouth: Ext aud canals clear, TMs without erythema, bulging. No erythema, swelling, or exudate on post pharynx.  Tonsils not swollen or erythematous. Hearing normal.  Neck: Supple, thyroid normal.  Respiratory: Respiratory effort normal, BS equal bilaterally without rales, rhonchi, wheezing or stridor.  Cardio: RRR with no MRGs. Brisk peripheral pulses without edema.  Abdomen: Soft, + BS.  Non tender, no guarding, rebound, hernias, masses. Lymphatics: Non tender without lymphadenopathy.  Musculoskeletal: Full ROM, 5/5 strength, normal gait.  Skin: Warm, dry without rashes, lesions, ecchymosis.  Neuro: Cranial nerves intact. Normal muscle tone, no cerebellar symptoms. Sensation intact.  Psych: Awake and oriented X 3, normal affect, Insight and Judgment appropriate.     Tonya Rossetti, NP 2:54 PM [facility]

## 2022-03-09 ENCOUNTER — Telehealth: Payer: Self-pay

## 2022-03-09 ENCOUNTER — Ambulatory Visit (INDEPENDENT_AMBULATORY_CARE_PROVIDER_SITE_OTHER): Payer: 59 | Admitting: Nurse Practitioner

## 2022-03-09 ENCOUNTER — Encounter: Payer: Self-pay | Admitting: Nurse Practitioner

## 2022-03-09 VITALS — BP 134/82 | HR 93 | Temp 97.9°F | Ht 65.0 in | Wt 197.2 lb

## 2022-03-09 DIAGNOSIS — H538 Other visual disturbances: Secondary | ICD-10-CM | POA: Diagnosis not present

## 2022-03-09 DIAGNOSIS — R519 Headache, unspecified: Secondary | ICD-10-CM | POA: Diagnosis not present

## 2022-03-09 DIAGNOSIS — E669 Obesity, unspecified: Secondary | ICD-10-CM | POA: Diagnosis not present

## 2022-03-09 DIAGNOSIS — Z8632 Personal history of gestational diabetes: Secondary | ICD-10-CM

## 2022-03-09 DIAGNOSIS — F988 Other specified behavioral and emotional disorders with onset usually occurring in childhood and adolescence: Secondary | ICD-10-CM | POA: Diagnosis not present

## 2022-03-09 DIAGNOSIS — R7309 Other abnormal glucose: Secondary | ICD-10-CM

## 2022-03-09 DIAGNOSIS — F419 Anxiety disorder, unspecified: Secondary | ICD-10-CM | POA: Diagnosis not present

## 2022-03-09 MED ORDER — SEMAGLUTIDE(0.25 OR 0.5MG/DOS) 2 MG/3ML ~~LOC~~ SOPN: 0.2500 mg | PEN_INJECTOR | SUBCUTANEOUS | 3 refills | Status: DC

## 2022-03-09 MED ORDER — AMPHETAMINE-DEXTROAMPHETAMINE 20 MG PO TABS: ORAL_TABLET | ORAL | 0 refills | Status: DC

## 2022-03-09 NOTE — Telephone Encounter (Signed)
Prior auth completed and submitted for Ozempic.

## 2022-03-13 ENCOUNTER — Ambulatory Visit: Payer: 59 | Admitting: Nurse Practitioner

## 2022-03-14 NOTE — Telephone Encounter (Signed)
Prior auth denied. 

## 2022-03-29 ENCOUNTER — Telehealth: Payer: Self-pay

## 2022-03-29 NOTE — Telephone Encounter (Signed)
Prior auth for Ozempic completed and submitted. 

## 2022-03-31 ENCOUNTER — Other Ambulatory Visit: Payer: Self-pay | Admitting: Nurse Practitioner

## 2022-03-31 NOTE — Telephone Encounter (Signed)
Her insurance says Tonya [name] is not covered

## 2022-04-05 NOTE — Telephone Encounter (Signed)
Prior auth denied. 

## 2022-04-12 ENCOUNTER — Ambulatory Visit (HOSPITAL_COMMUNITY)
Admission: RE | Admit: 2022-04-12 | Discharge: 2022-04-12 | Disposition: A | Discharge status: Home / Self Care | Payer: 59 | Source: Ambulatory Visit | Attending: Nurse Practitioner | Admitting: Nurse Practitioner

## 2022-04-12 DIAGNOSIS — G4452 New daily persistent headache (NDPH): Secondary | ICD-10-CM | POA: Insufficient documentation

## 2022-04-12 DIAGNOSIS — R519 Headache, unspecified: Secondary | ICD-10-CM | POA: Diagnosis not present

## 2022-04-21 ENCOUNTER — Other Ambulatory Visit: Payer: Self-pay | Admitting: Nurse Practitioner

## 2022-04-21 DIAGNOSIS — F988 Other specified behavioral and emotional disorders with onset usually occurring in childhood and adolescence: Secondary | ICD-10-CM

## 2022-04-21 MED ORDER — AMPHETAMINE-DEXTROAMPHETAMINE 20 MG PO TABS: ORAL_TABLET | ORAL | 0 refills | Status: DC

## 2022-05-25 ENCOUNTER — Encounter: Payer: 59 | Admitting: Nurse Practitioner

## 2022-05-29 ENCOUNTER — Other Ambulatory Visit: Payer: Self-pay | Admitting: Nurse Practitioner

## 2022-05-29 DIAGNOSIS — F988 Other specified behavioral and emotional disorders with onset usually occurring in childhood and adolescence: Secondary | ICD-10-CM

## 2022-05-29 MED ORDER — AMPHETAMINE-DEXTROAMPHETAMINE 20 MG PO TABS: ORAL_TABLET | ORAL | 0 refills | Status: DC

## 2022-06-28 NOTE — Progress Notes (Signed)
 Complete Physical  Assessment and Plan:  Tonya [name] was seen today for annual exam.  Diagnoses and all orders for this visit:  Routine general medical examination at a health care facility Due annually   Anxiety Declines medications today  Stress management techniques discussed, increase water, good sleep hygiene discussed, increase exercise, and increase veggies.   Attention deficit disorder (ADD) without hyperactivity Continue medications; will stop if suspicion for pregnancy  Helps with focus, no AE's. The patient was counseled on the addictive nature of the medication and was encouraged to take drug holidays when not needed.   Mixed hyperlipidemia Not currently on treatment - family planning Continue low cholesterol diet and exercise.  Check lipid panel. -     Lipid panel  Abnormal Glucose Continue diet and exercise -     A1C  Screening for thyroid disorder -     TSH  Vitamin D deficiency -     VITAMIN D 25 Hydroxy (Vit-D Deficiency, Fractures)  Vit B12 deficiency - B12  Fatigue Take multivitamin and extra vit D daily Focus on diet, exercise and weight loss If snoring and if fatigue persists after weight loss will order sleep study  Medication management -     CBC with Differential/Platelet -     CMP/GFR  Obesity Fair life protein shakes Eat more frequently - try not to go more than 6 hours without protein Aim for 90 grams of protein a day- 30 breakfast/30 lunch 30 dinner Try to keep net carbs less than 50 Net Carbs=Total Carbs-fiber- sugar alcohols Exercise heartrate 120-140(fat burning zone)- walking 20-30 minutes 4 days a week  Zepbound 2.5 mg SQ QW   Discussed med's effects and SE's. Screening labs and tests as requested with regular follow-up as recommended. Over 40 minutes of exam, counseling, chart review, and complex, high level critical decision making was performed this visit.   Future Appointments  Date Time Provider Department Center   07/02/2023 10:00 AM Raynelle Dick, NP [facility] None     HPI  37 y.o. Caucasian female presents for a complete physical and follow up for has Anxiety; ADD (attention deficit disorder); Hyperlipidemia; Obesity (BMI 30.0-34.9); Continuous RUQ abdominal pain; Vitamin D deficiency; Previous cesarean section; and S/P cesarean section on their problem list.   She is married, works on oncology floor as Charity fundraiser. 1 boy, about turn 4 in June.   [facility], last in 2018 but goes annually. She had a baby in July- she is not breastfeeding. Baby is sleeping well.  She works nights.  She does snore very loudly and contributes weight gain to her snoring.     Hx of anxiety, got horrible headaches from Wellbutrin and had MRI that was negative   She does have ADD and takes adderall 20 mg BID only on days she works.   BMI is Body mass index is 33.91 kg/m., She does not do any structured exercise. Diet is limiting saturated fats and simple carbs Wt Readings from Last 3 Encounters:  06/29/22 203 lb 12.8 oz (92.4 kg)  03/09/22 197 lb 3.2 oz (89.4 kg)  12/29/21 229 lb 1.6 oz (103.9 kg)    Today their BP is BP: 110/70  BP Readings from Last 3 Encounters:  06/29/22 110/70  03/09/22 134/82  12/31/21 108/70  She does workout. She denies chest pain, shortness of breath, dizziness.   She is not on cholesterol medication and denies myalgias. Her cholesterol is not at goal.  The cholesterol last visit was:   Lab Results  Component Value Date   CHOL 224 (H) 10/28/2020   HDL 54 10/28/2020   LDLCALC 147 (H) 10/28/2020   TRIG 114 10/28/2020   CHOLHDL 4.1 10/28/2020   She has a hx of gestational diabetes; Last A1C in the office was:  Lab Results  Component Value Date   HGBA1C 5.2 10/28/2020   She is drinking very little water. Last GFR: Lab Results  Component Value Date   GFRNONAA 95 10/28/2020   Patient is not on Vitamin D supplement and was below goal  at last check, admits inconsistently taking 5000 IU:    Lab Results  Component Value Date   VD25OH 28 (L) 10/28/2020     Lab Results  Component Value Date   VITAMINB12 356 06/11/2018     Current Medications:  Current Outpatient Medications on File Prior to Visit  Medication Sig Dispense Refill   amphetamine-dextroamphetamine (ADDERALL) 20 MG tablet Take  1 to 2 tablets  Daily  for ADD 60 tablet 0   LO LOESTRIN FE 1 MG-10 MCG / 10 MCG tablet Take 1 tablet by mouth daily.     acetaminophen (TYLENOL) 325 MG tablet Take 650 mg by mouth every 6 (six) hours as needed for mild pain or moderate pain. (Patient not taking: Reported on 06/29/2022)     busPIRone (BUSPAR) 5 MG tablet Take 1 tablet by mouth 2 (two) times daily. (Patient not taking: Reported on 06/29/2022)     Cholecalciferol (VITAMIN D) 50 MCG (2000 UT) tablet Take 2,000 Units by mouth daily. (Patient not taking: Reported on 03/09/2022)     oxyCODONE (OXY IR/ROXICODONE) 5 MG immediate release tablet Take 1-2 tablets (5-10 mg total) by mouth every 4 (four) hours as needed for moderate pain. (Patient not taking: Reported on 06/29/2022) 30 tablet 0   Prenatal Vit-Fe Fumarate-FA (PRENATAL MULTIVITAMIN) TABS tablet Take 1 tablet by mouth daily at 12 noon. (Patient not taking: Reported on 06/29/2022)     Semaglutide,0.25 or 0.5MG /DOS, 2 MG/3ML SOPN Inject 0.25 mg into the skin once a week. (Patient not taking: Reported on 06/29/2022) 3 mL 3   No current facility-administered medications on file prior to visit.   Allergies:  No Known Allergies Medical History:  She has Anxiety; ADD (attention deficit disorder); Hyperlipidemia; Obesity (BMI 30.0-34.9); Continuous RUQ abdominal pain; Vitamin D deficiency; Previous cesarean section; and S/P cesarean section on their problem list. Health Maintenance:   Immunization History  Administered Date(s) Administered   Influenza Split 04/17/2013   Influenza-Unspecified 03/27/2019, 03/29/2022   Tdap 02/24/2015    Unspecified SARS-COV-2 Vaccination 07/17/2019, 08/07/2019   Tetanus: 2016 Flu vaccine: 03/29/22 Covid 19: 2/2 -   LMP: Patient's last menstrual period was 05/10/2022 (approximate). Pap: 2018- at GYN, has pelvic annually, neg HPV MGM: n/a  Colonoscopy: n/a EGD: 02/27/2020, Dr. Adela Lank  Last Dental Exam: 2022 - q6 months Last Eye Exam: 2021, goes annually Last Derm: 2021  Patient Care Team: Lucky Cowboy, MD as PCP - General (Internal Medicine) Emmit Pomfret, MD as Referring Physician (Obstetrics and Gynecology)  Surgical History:  She has a past surgical history that includes Wisdom tooth extraction; Cesarean section (N/A, 12/03/2016); and Cesarean section (N/A, 12/29/2021). Family History:  Herfamily history includes Depression in her mother; Eczema in her son; Hyperlipidemia in her brother; Hypertension in her father and mother; Lupus in her paternal aunt and paternal grandmother; Ovarian cancer in her paternal grandmother; Prostate cancer (age of onset: 80) in her father.  Social History:  She reports that she has never smoked. She has never used smokeless tobacco. She reports current alcohol use of about 3.0 standard drinks of alcohol per week. She reports that she does not use drugs.   Review of Systems: Review of Systems  Constitutional:  Positive for malaise/fatigue. Negative for weight loss.  HENT:  Negative for hearing loss and tinnitus.   Eyes:  Negative for blurred vision and double vision.  Respiratory:  Negative for cough, shortness of breath and wheezing.        Snoring  Cardiovascular:  Negative for chest pain, palpitations, orthopnea, claudication and leg swelling.  Gastrointestinal:  Negative for abdominal pain, blood in stool, constipation, diarrhea, heartburn, melena, nausea and vomiting.  Genitourinary: Negative.   Musculoskeletal:  Negative for joint pain and myalgias.  Skin:  Negative for rash.  Neurological:  Negative for dizziness, tingling, sensory  change, weakness and headaches.  Endo/Heme/Allergies:  Negative for polydipsia.  Psychiatric/Behavioral:  Negative for depression, substance abuse and suicidal ideas. The patient is nervous/anxious. The patient does not have insomnia.   All other systems reviewed and are negative.   Physical Exam: Estimated body mass index is 33.91 kg/m as calculated from the following:   Height as of this encounter: 5\' 5"  (1.651 m).   Weight as of this encounter: 203 lb 12.8 oz (92.4 kg). BP 110/70   Pulse 88   Temp 98.1 F (36.7 C)   Ht 5\' 5"  (1.651 m)   Wt 203 lb 12.8 oz (92.4 kg)   LMP 05/10/2022 (Approximate)   SpO2 99%   Breastfeeding No   BMI 33.91 kg/m  General Appearance: Well nourished, in no apparent distress.  Eyes: PERRLA, EOMs, conjunctiva no swelling or erythema Sinuses: No Frontal/maxillary tenderness  ENT/Mouth: Ext aud canals clear, normal light reflex with TMs without erythema, bulging. Good dentition. No erythema, swelling, or exudate on post pharynx. Tonsils not swollen or erythematous. Hearing normal.  Neck: Supple, thyroid normal. No bruits  Respiratory: Respiratory effort normal, BS equal bilaterally without rales, rhonchi, wheezing or stridor.  Cardio: RRR without murmurs, rubs or gallops. Brisk peripheral pulses without edema.  Chest: symmetric, with normal excursions and percussion.  Breasts: Defer to GYN Abdomen: Soft, nontender, no guarding, rebound, hernias, masses, or organomegaly.  Lymphatics: Non tender without lymphadenopathy.  Genitourinary: Defer to GYN Musculoskeletal: Full ROM all peripheral extremities,5/5 strength, and normal gait.  Skin: Warm, dry without rashes, lesions, ecchymosis.  Neuro: Cranial nerves intact, reflexes equal bilaterally. Normal muscle tone, no cerebellar symptoms. Sensation intact.  Psych: Awake and oriented X 3, normal affect, Insight and Judgment appropriate.   EKG: Normal EKG 09/2018 reviewed - no new concerns, defer   Presleigh Feldstein E   10:16 AM [facility]

## 2022-06-29 ENCOUNTER — Encounter: Payer: Self-pay | Admitting: Nurse Practitioner

## 2022-06-29 ENCOUNTER — Ambulatory Visit (INDEPENDENT_AMBULATORY_CARE_PROVIDER_SITE_OTHER): Payer: 59 | Admitting: Nurse Practitioner

## 2022-06-29 ENCOUNTER — Other Ambulatory Visit (HOSPITAL_COMMUNITY): Payer: Self-pay

## 2022-06-29 VITALS — BP 110/70 | HR 88 | Temp 98.1°F | Ht 65.0 in | Wt 203.8 lb

## 2022-06-29 DIAGNOSIS — Z0000 Encounter for general adult medical examination without abnormal findings: Secondary | ICD-10-CM

## 2022-06-29 DIAGNOSIS — E559 Vitamin D deficiency, unspecified: Secondary | ICD-10-CM | POA: Diagnosis not present

## 2022-06-29 DIAGNOSIS — E66811 Obesity, class 1: Secondary | ICD-10-CM

## 2022-06-29 DIAGNOSIS — Z79899 Other long term (current) drug therapy: Secondary | ICD-10-CM

## 2022-06-29 DIAGNOSIS — Z1329 Encounter for screening for other suspected endocrine disorder: Secondary | ICD-10-CM

## 2022-06-29 DIAGNOSIS — Z1389 Encounter for screening for other disorder: Secondary | ICD-10-CM

## 2022-06-29 DIAGNOSIS — E538 Deficiency of other specified B group vitamins: Secondary | ICD-10-CM

## 2022-06-29 DIAGNOSIS — R7309 Other abnormal glucose: Secondary | ICD-10-CM | POA: Diagnosis not present

## 2022-06-29 DIAGNOSIS — F988 Other specified behavioral and emotional disorders with onset usually occurring in childhood and adolescence: Secondary | ICD-10-CM

## 2022-06-29 DIAGNOSIS — Z0001 Encounter for general adult medical examination with abnormal findings: Secondary | ICD-10-CM

## 2022-06-29 DIAGNOSIS — E669 Obesity, unspecified: Secondary | ICD-10-CM | POA: Diagnosis not present

## 2022-06-29 DIAGNOSIS — F419 Anxiety disorder, unspecified: Secondary | ICD-10-CM

## 2022-06-29 DIAGNOSIS — E782 Mixed hyperlipidemia: Secondary | ICD-10-CM | POA: Diagnosis not present

## 2022-06-29 MED ORDER — ZEPBOUND 2.5 MG/0.5ML ~~LOC~~ SOAJ
2.5000 mg | SUBCUTANEOUS | 3 refills | Status: DC
  Filled 2022-06-29: qty 2, 28d supply, fill #0

## 2022-06-29 MED ORDER — AMPHETAMINE-DEXTROAMPHETAMINE 20 MG PO TABS: ORAL_TABLET | ORAL | 0 refills | Status: DC

## 2022-06-29 NOTE — Patient Instructions (Signed)
Fair life protein shakes Eat more frequently - try not to go more than 6 hours without protein Aim for 90 grams of protein a day- 30 breakfast/30 lunch 30 dinner Try to keep net carbs less than 50 Net Carbs=Total Carbs-fiber- sugar alcohols Exercise heartrate 120-140(fat burning zone)- walking 20-30 minutes 4 days a week  

## 2022-06-30 LAB — COMPLETE METABOLIC PANEL WITH GFR
AG Ratio: 1.8 (calc) (ref 1.0–2.5)
ALT: 51 U/L — ABNORMAL HIGH (ref 6–29)
AST: 26 U/L (ref 10–30)
Albumin: 4.5 g/dL (ref 3.6–5.1)
Alkaline phosphatase (APISO): 48 U/L (ref 31–125)
BUN: 13 mg/dL (ref 7–25)
CO2: 26 mmol/L (ref 20–32)
Calcium: 9.4 mg/dL (ref 8.6–10.2)
Chloride: 104 mmol/L (ref 98–110)
Creat: 0.8 mg/dL (ref 0.50–0.97)
Globulin: 2.5 g/dL (calc) (ref 1.9–3.7)
Glucose, Bld: 91 mg/dL (ref 65–139)
Potassium: 4 mmol/L (ref 3.5–5.3)
Sodium: 138 mmol/L (ref 135–146)
Total Bilirubin: 0.5 mg/dL (ref 0.2–1.2)
Total Protein: 7 g/dL (ref 6.1–8.1)
eGFR: 98 mL/min/{1.73_m2} (ref >=60)

## 2022-06-30 LAB — URINALYSIS W MICROSCOPIC + REFLEX CULTURE
Bacteria, UA: NONE SEEN /HPF
Bilirubin Urine: NEGATIVE
Glucose, UA: NEGATIVE
Hgb urine dipstick: NEGATIVE
Hyaline Cast: NONE SEEN /LPF
Ketones, ur: NEGATIVE
Leukocyte Esterase: NEGATIVE
Nitrites, Initial: NEGATIVE
Protein, ur: NEGATIVE
RBC / HPF: NONE SEEN /HPF (ref 0–2)
Specific Gravity, Urine: 1.006 (ref 1.001–1.035)
WBC, UA: NONE SEEN /HPF (ref 0–5)
pH: 6 (ref 5.0–8.0)

## 2022-06-30 LAB — LIPID PANEL
Cholesterol: 225 mg/dL — ABNORMAL HIGH (ref <200)
HDL: 46 mg/dL — ABNORMAL LOW (ref >=50)
LDL Cholesterol (Calc): 136 mg/dL (calc) — ABNORMAL HIGH
Non-HDL Cholesterol (Calc): 179 mg/dL (calc) — ABNORMAL HIGH (ref <130)
Total CHOL/HDL Ratio: 4.9 (calc) (ref <5.0)
Triglycerides: 277 mg/dL — ABNORMAL HIGH (ref <150)

## 2022-06-30 LAB — CBC WITH DIFFERENTIAL/PLATELET
Absolute Monocytes: 299 cells/uL (ref 200–950)
Basophils Absolute: 51 cells/uL (ref 0–200)
Basophils Relative: 1.1 %
Eosinophils Absolute: 138 cells/uL (ref 15–500)
Eosinophils Relative: 3 %
HCT: 42.8 % (ref 35.0–45.0)
Hemoglobin: 14.4 g/dL (ref 11.7–15.5)
Lymphs Abs: 1569 cells/uL (ref 850–3900)
MCH: 30.5 pg (ref 27.0–33.0)
MCHC: 33.6 g/dL (ref 32.0–36.0)
MCV: 90.7 fL (ref 80.0–100.0)
MPV: 10.5 fL (ref 7.5–12.5)
Monocytes Relative: 6.5 %
Neutro Abs: 2544 cells/uL (ref 1500–7800)
Neutrophils Relative %: 55.3 %
Platelets: 185 10*3/uL (ref 140–400)
RBC: 4.72 10*6/uL (ref 3.80–5.10)
RDW: 11.5 % (ref 11.0–15.0)
Total Lymphocyte: 34.1 %
WBC: 4.6 10*3/uL (ref 3.8–10.8)

## 2022-06-30 LAB — VITAMIN D 25 HYDROXY (VIT D DEFICIENCY, FRACTURES): Vit D, 25-Hydroxy: 32 ng/mL (ref 30–100)

## 2022-06-30 LAB — VITAMIN B12: Vitamin B-12: 336 pg/mL (ref 200–1100)

## 2022-06-30 LAB — HEMOGLOBIN A1C
Hgb A1c MFr Bld: 5.5 % of total Hgb (ref <5.7)
Mean Plasma Glucose: 111 mg/dL
eAG (mmol/L): 6.2 mmol/L

## 2022-06-30 LAB — TSH: TSH: 1.17 mIU/L

## 2022-06-30 LAB — MAGNESIUM: Magnesium: 2 mg/dL (ref 1.5–2.5)

## 2022-06-30 LAB — NO CULTURE INDICATED

## 2022-07-03 ENCOUNTER — Other Ambulatory Visit (HOSPITAL_COMMUNITY): Payer: Self-pay

## 2022-07-07 ENCOUNTER — Telehealth: Payer: Self-pay

## 2022-07-07 ENCOUNTER — Other Ambulatory Visit (HOSPITAL_COMMUNITY): Payer: Self-pay

## 2022-07-07 NOTE — Telephone Encounter (Signed)
Zepbound prior auth completed and submitted.  

## 2022-07-11 ENCOUNTER — Telehealth: Payer: Self-pay

## 2022-07-11 ENCOUNTER — Other Ambulatory Visit (HOSPITAL_COMMUNITY): Payer: Self-pay

## 2022-07-11 ENCOUNTER — Other Ambulatory Visit: Payer: Self-pay | Admitting: Nurse Practitioner

## 2022-07-11 DIAGNOSIS — E669 Obesity, unspecified: Secondary | ICD-10-CM

## 2022-07-11 MED ORDER — WEGOVY 0.5 MG/0.5ML ~~LOC~~ SOAJ
0.5000 mg | SUBCUTANEOUS | 2 refills | Status: DC
  Filled 2022-07-11: qty 2, 28d supply, fill #0

## 2022-07-11 NOTE — Telephone Encounter (Signed)
[employer] prior auth completed and submitted.

## 2022-07-11 NOTE — Telephone Encounter (Addendum)
Prior auth denied due not having tried [place] or FPULGS. Sent in an RX for [employer]. Patient aware of changes.

## 2022-07-11 NOTE — Progress Notes (Signed)
Insurance requires trial with Saxenda or wegovy before Zepbound can be used.  Script for [employer] 0.'5mg'$  SQ QW sent to pharmacy

## 2022-07-12 NOTE — Telephone Encounter (Signed)
[facility] approved. Sent a message in [place].

## 2022-07-18 ENCOUNTER — Other Ambulatory Visit (HOSPITAL_COMMUNITY): Payer: Self-pay

## 2022-07-28 ENCOUNTER — Other Ambulatory Visit: Payer: Self-pay | Admitting: Nurse Practitioner

## 2022-07-28 DIAGNOSIS — F988 Other specified behavioral and emotional disorders with onset usually occurring in childhood and adolescence: Secondary | ICD-10-CM

## 2022-07-29 MED ORDER — AMPHETAMINE-DEXTROAMPHETAMINE 20 MG PO TABS: ORAL_TABLET | ORAL | 0 refills | Status: DC

## 2022-08-07 ENCOUNTER — Other Ambulatory Visit: Payer: Self-pay

## 2022-08-08 ENCOUNTER — Other Ambulatory Visit: Payer: Self-pay

## 2022-08-09 ENCOUNTER — Other Ambulatory Visit: Payer: Self-pay

## 2022-08-15 DIAGNOSIS — N898 Other specified noninflammatory disorders of vagina: Secondary | ICD-10-CM | POA: Diagnosis not present

## 2022-08-27 ENCOUNTER — Other Ambulatory Visit: Payer: Self-pay | Admitting: Nurse Practitioner

## 2022-08-27 ENCOUNTER — Encounter: Payer: Self-pay | Admitting: Nurse Practitioner

## 2022-08-27 DIAGNOSIS — F988 Other specified behavioral and emotional disorders with onset usually occurring in childhood and adolescence: Secondary | ICD-10-CM

## 2022-08-28 ENCOUNTER — Telehealth: Payer: 59 | Admitting: Physician Assistant

## 2022-08-28 DIAGNOSIS — L739 Follicular disorder, unspecified: Secondary | ICD-10-CM

## 2022-08-28 MED ORDER — AMPHETAMINE-DEXTROAMPHETAMINE 20 MG PO TABS: ORAL_TABLET | ORAL | 0 refills | Status: DC

## 2022-08-28 MED ORDER — CEPHALEXIN 500 MG PO CAPS: 500.0000 mg | ORAL_CAPSULE | Freq: Three times a day (TID) | ORAL | 0 refills | Status: DC

## 2022-08-28 NOTE — Progress Notes (Signed)
E Visit for Rash  We are sorry that you are not feeling well. Here is how we plan to help!  Based upon what you have shared with me it looks like you have a bacterial follicultits.  Folliculitis is inflammation of the hair follicles that can be caused by a superficial infection of the skin and is treated with an antibiotic. I have prescribed: and Keflex 500 mg three times per day for 7 days  HOME CARE:  Take cool showers and avoid direct sunlight. Apply cool compress or wet dressings. Take a bath in an oatmeal bath.  Sprinkle content of one Aveeno packet under running faucet with comfortably warm water.  Bathe for 15-20 minutes, 1-2 times daily.  Pat dry with a towel. Do not rub the rash. Use hydrocortisone cream. Take an antihistamine like Benadryl for widespread rashes that itch.  The adult dose of Benadryl is 25-50 mg by mouth 4 times daily. Caution:  This type of medication may cause sleepiness.  Do not drink alcohol, drive, or operate dangerous machinery while taking antihistamines.  Do not take these medications if you have prostate enlargement.  Read package instructions thoroughly on all medications that you take.  GET HELP RIGHT AWAY IF:  Symptoms don't go away after treatment. Severe itching that persists. If you rash spreads or swells. If you rash begins to smell. If it blisters and opens or develops a yellow-brown crust. You develop a fever. You have a sore throat. You become short of breath.  MAKE SURE YOU:  Understand these instructions. Will watch your condition. Will get help right away if you are not doing well or get worse.  Thank you for choosing an e-visit.  Your e-visit answers were reviewed by a board certified advanced clinical practitioner to complete your personal care plan. Depending upon the condition, your plan could have included both over the counter or prescription medications.  Please review your pharmacy choice. Make sure the pharmacy is open so  you can pick up prescription now. If there is a problem, you may contact your provider through [employer] and have the prescription routed to another pharmacy.  Your safety is important to [place]. If you have drug allergies check your prescription carefully.   For the next 24 hours you can use MyChart to ask questions about today's visit, request a non-urgent call back, or ask for a work or school excuse. You will get an email in the next two days asking about your experience. I hope that your e-visit has been valuable and will speed your recovery.  I have spent 5 minutes in review of e-visit questionnaire, review and updating patient chart, medical decision making and response to patient.   Mar Daring, PA-C

## 2022-08-30 ENCOUNTER — Encounter: Payer: Self-pay | Admitting: Nurse Practitioner

## 2022-08-30 ENCOUNTER — Ambulatory Visit (INDEPENDENT_AMBULATORY_CARE_PROVIDER_SITE_OTHER): Payer: 59 | Admitting: Nurse Practitioner

## 2022-08-30 ENCOUNTER — Other Ambulatory Visit (HOSPITAL_COMMUNITY): Payer: Self-pay

## 2022-08-30 VITALS — BP 128/82 | HR 128 | Temp 99.5°F | Ht 65.0 in | Wt 200.0 lb

## 2022-08-30 DIAGNOSIS — L299 Pruritus, unspecified: Secondary | ICD-10-CM | POA: Diagnosis not present

## 2022-08-30 DIAGNOSIS — R509 Fever, unspecified: Secondary | ICD-10-CM | POA: Diagnosis not present

## 2022-08-30 DIAGNOSIS — L739 Follicular disorder, unspecified: Secondary | ICD-10-CM | POA: Diagnosis not present

## 2022-08-30 DIAGNOSIS — R110 Nausea: Secondary | ICD-10-CM

## 2022-08-30 DIAGNOSIS — Z8614 Personal history of Methicillin resistant Staphylococcus aureus infection: Secondary | ICD-10-CM | POA: Diagnosis not present

## 2022-08-30 MED ORDER — DEXAMETHASONE SODIUM PHOSPHATE 10 MG/ML IJ SOLN
10.0000 mg | Freq: Once | INTRAMUSCULAR | Status: AC
  Administered 2022-08-30: 10 mg via INTRAMUSCULAR

## 2022-08-30 MED ORDER — DOXYCYCLINE HYCLATE 100 MG PO TABS
100.0000 mg | ORAL_TABLET | Freq: Two times a day (BID) | ORAL | 0 refills | Status: AC
  Filled 2022-08-30: qty 14, 7d supply, fill #0

## 2022-08-30 MED ORDER — PREDNISONE 10 MG PO TABS
ORAL_TABLET | ORAL | 0 refills | Status: AC
  Filled 2022-08-30: qty 13, 7d supply, fill #0

## 2022-08-30 MED ORDER — ONDANSETRON 4 MG PO TBDP
4.0000 mg | ORAL_TABLET | Freq: Three times a day (TID) | ORAL | 0 refills | Status: AC | PRN
  Filled 2022-08-30: qty 20, 7d supply, fill #0

## 2022-08-30 MED ORDER — MUPIROCIN 2 % EX OINT
1.0000 | TOPICAL_OINTMENT | Freq: Two times a day (BID) | CUTANEOUS | 0 refills | Status: DC
  Filled 2022-08-30: qty 22, 10d supply, fill #0

## 2022-08-30 MED ORDER — FLUCONAZOLE 150 MG PO TABS
ORAL_TABLET | ORAL | 0 refills | Status: DC
  Filled 2022-08-30: qty 3, 9d supply, fill #0

## 2022-08-30 NOTE — Patient Instructions (Signed)
MRSA Infection, Diagnosis, Adult Methicillin-resistant Staphylococcus aureus (MRSA) infection is caused by bacteria called Staphylococcus aureus, or staph, that no longer respond to common antibiotic medicines (drug-resistant bacteria). MRSA infection can be hard to treat. Most of the time, MRSA can be on the skin or in the nose without causing problems (colonized). However, if MRSA enters the body through a cut, a sore, or an invasive medical device, it can cause a serious infection. What are the causes? This condition is caused by staph bacteria. Illness may develop after exposure to the bacteria through: Skin-to-skin contact with someone who is infected with MRSA. Touching surfaces that have the bacteria on them. Having a procedure or using equipment that allows MRSA to enter the body. Having MRSA that lives on your skin and then enters your body through: A cut or scratch. A surgery or procedure. The use of a medical device. Contact with the bacteria may occur: During a stay in a hospital, rehabilitation facility, nursing home, or other health care facility (health care-associated MRSA). In daily activities where there is close contact with others, such as sports, child care centers, or at home (community-associated MRSA). What increases the risk? You are more likely to develop this condition if you: Have a surgery or procedure. Have an IV or a thin tube (catheter) placed in your body. Are elderly. Are on kidney dialysis. Have recently taken an antibiotic medicine. Live in a long-term care facility. Have a chronic wound or skin ulcer. Have a weak body defense system (immune system). Play sports that involve skin-to-skin contact. Live in a crowded place, like a dormitory or [employer] Share towels, razors, or sports equipment with other people. Have a history of MRSA infection or colonization. What are the signs or symptoms? Symptoms of this condition depend on the area that  is affected. Symptoms may include: A pus-filled pimple or boil. Pus that drains from your skin. A sore (abscess) under your skin or somewhere in your body. Fever with or without chills. Difficulty breathing. Coughing up blood. Redness, warmth, swelling, or pain in the affected area. How is this diagnosed? This condition may be diagnosed based on: A physical exam. Your medical history. Taking a sample from the infected area and growing it in a lab (culture). You may also have other tests, including: Imaging tests, such as X-rays, a CT scan, or an MRI. Lab tests, such as blood, urine, or phlegm (sputum) tests. You skin or nose may be swabbed when you are admitted to a health care facility for a procedure. This is to screen for MRSA. How is this treated? Treatment depends on the type of MRSA infection you have and how severe, deep, or extensive it is. Treatment may include: Antibiotic medicines. Surgery to drain pus from the infected area. Severe infections may require a hospital stay. Follow these instructions at home: Medicines Take over-the-counter and prescription medicines only as told by your health care provider. If you were prescribed an antibiotic medicine, use it as told by your health care provider. Do not stop using the antibiotic even if you start to feel better. Prevention Follow these instructions to avoid spreading the infection to others: Wash your hands frequently with soap and water. If soap and water are not available, use an alcohol-based hand sanitizer. Avoid close contact with those around you as much as possible. Do not use towels, razors, toothbrushes, bedding, or other items that will be used by others. Wash towels, bedding, and clothes in the washing machine with detergent  and hot water. Dry them in a hot dryer. Clean surfaces regularly to remove germs (disinfection). Use products or solutions that contain bleach. Make sure you disinfect bathroom surfaces, food  preparation areas, exercise equipment, and doorknobs.  General instructions If you have a wound, follow instructions from your health care provider about how to take care of your wound. Do not pick at scabs. Do not try to drain any infection sites or pimples. Tell all your health care providers that you have MRSA, or if you have ever had a MRSA infection. Keep all follow-up visits as told by your health care provider. This is important. Contact a health care provider if you: Do not get better. Have symptoms that get worse. Have new symptoms. Get help right away if you have: Nausea or vomiting, or if you cannot take medicine without vomiting. Trouble breathing. Chest pain. These symptoms may represent a serious problem that is an emergency. Do not wait to see if the symptoms will go away. Get medical help right away. Call your local emergency services (911 in the [place] Do not drive yourself to the hospital. Summary MRSA infection is caused by bacteria called Staphylococcus aureus, or staph, that no longer respond to common antibiotic medicines. Treatment for this condition depends on the type of MRSA infection you have and how severe, deep, and extensive it is. If you were prescribed an antibiotic medicine, use it as told by your health care provider. Do not stop using the antibiotic even if you start to feel better. Follow instructions from your health care provider to avoid spreading the infection to others. This information is not intended to replace advice given to you by your health care provider. Make sure you discuss any questions you have with your health care provider. Document Revised: 04/27/2022 Document Reviewed: 04/27/2022 Elsevier Patient Education  [place].   Folliculitis  Folliculitis occurs when hair follicles become inflamed. A hair follicle is a tiny opening in your skin where your hair grows from. This condition often occurs on the scalp, thighs, legs,  back, and buttocks but can happen anywhere on the body. What are the causes? A common cause of this condition is an infection from bacteria. The type of folliculitis caused by bacteria can last a long time or go away and come back. The bacteria can live anywhere on your skin. They are often found in the nostrils. Other causes may include: An infection from a fungus. An infection from a virus. Your skin touching some chemicals, such as oils and tars. Shaving or waxing. Greasy ointments or creams put on the skin. What increases the risk? You are more likely to develop this condition if: Your body has a weak disease-fighting system (immune system). You have diabetes. You are obese. What are the signs or symptoms? Symptoms of this condition include: Redness. Soreness. Swelling. Itching. Small white or yellow, itchy spots filled with pus (pustules) that appear over a red area. If the infection goes deep into the follicle, these may turn into a boil (furuncle). A group of boils (carbuncle). These tend to form in hairy, sweaty areas of the body. How is this diagnosed? This condition is diagnosed with a skin exam. Your health care provider may take a sample of one of the pustules or boils to test in a lab. How is this treated? This condition may be treated by: Putting a warm, wet cloth (warm compress) on the affected areas. Taking antibiotics or applying them to the skin. Applying or  bathing with a solution that kills germs (antiseptic). Taking an over-the-counter medicine. This can help with itching. Having a procedure to drain pustules or boils. This may be done if a pustule or boil contains a lot of pus or fluid. Having laser hair removal. This may be done when the condition lasts for a long time. Follow these instructions at home: Managing pain and swelling  If directed, apply heat to the affected area as often as told by your health care provider. Use the heat source that your health  care provider recommends, such as a moist heat pack or a heating pad. Place a towel between your skin and the heat source. Leave the heat on for 20-30 minutes. If your skin turns bright red, remove the heat right away to prevent burns. The risk of burns is higher if you cannot feel pain, heat, or cold. General instructions Take over-the-counter and prescription medicines only as told by your health care provider. If you were prescribed antibiotics, take or apply them as told by your health care provider. Do not stop using the antibiotic even if you start to feel better. Check your irritated area every day for signs of infection. Check for: More redness, swelling, or pain. Fluid or blood. Warmth. Pus or a bad smell. Do not shave irritated skin. Keep all follow-up visits. Your health care provider will check if the treatments are helping. Contact a health care provider if: You have a fever. You have any signs of infection. Red streaks are spreading from the affected area. This information is not intended to replace advice given to you by your health care provider. Make sure you discuss any questions you have with your health care provider. Document Revised: 11/15/2021 Document Reviewed: 11/15/2021 Elsevier Patient Education  [place].

## 2022-08-30 NOTE — Progress Notes (Signed)
Assessment and Plan:  Tonya [name] was seen today for an episodic visit.  Diagnoses and all order for this visit:  Folliculitis Continue Keflex as directed Start Doxycycline as directed. Steroid injection administered - tolerated well.  Continue to monitor for spreading of rash. If not responsive to abx tmt return to clinic for further review and evaluation.  History of MRSA infection  - MRSA culture  Pruritus May take oatmeal bath. Continue OTC antihistamine Benadryl. Steroid injection administered - tolerated well.  Fever, low grade Tylenol 500 mg for management of fever. Continue to monitor.  Nausea Likely r/t Keflex. Start Zofran as directed as needed Suggest probiotic or yogurt to help aide with natural gut flora.  Will also send Diflucan for any propylitic vaginal yeast treatment considering multiple antibiotics.    Orders Placed This Encounter  Procedures   MRSA culture   Meds ordered this encounter  Medications   mupirocin ointment (BACTROBAN) 2 %    Sig: Apply to affected area 2 times a day    Dispense:  22 g    Refill:  0    Order Specific Question:   Supervising Provider    Answer:   Unk Pinto [[id number]]   ondansetron (ZOFRAN-ODT) 4 MG disintegrating tablet    Sig: Take 1 tablet (4 mg total) by mouth every 8 (eight) hours as needed for nausea or vomiting.    Dispense:  20 tablet    Refill:  0    Order Specific Question:   Supervising Provider    Answer:   Unk Pinto [[id number]]   predniSONE (DELTASONE) 10 MG tablet    Sig: Take 1 tablet (10 mg total) by mouth 3 (three) times daily for 2 days, THEN 1 tablet (10 mg total) 2 (two) times daily for 2 days, THEN 1 tablet (10 mg total) daily for 3 days.    Dispense:  13 tablet    Refill:  0    Order Specific Question:   Supervising Provider    Answer:   Unk Pinto [[id number]]   dexamethasone (DECADRON) injection 10 mg   doxycycline (VIBRA-TABS) 100 MG tablet    Sig: Take 1 tablet (100 mg  total) by mouth 2 (two) times daily for 7 days.    Dispense:  14 tablet    Refill:  0    Order Specific Question:   Supervising Provider    Answer:   Unk Pinto [[id number]]   fluconazole (DIFLUCAN) 150 MG tablet    Sig: Take 1 tablet (150 mg) at the sign of symptoms.  If not resolved after 72 hours may take additional 150 mg dosage.    Dispense:  3 tablet    Refill:  0    Order Specific Question:   Supervising Provider    Answer:   Unk Pinto [phone]    Notify office for further evaluation and treatment, questions or concerns if s/s fail to improve.  Discussed going to ER if s/s fail to improve, or for any underlying MRSA infection.  The risks and benefits of my recommendations, as well as other treatment options were discussed with the patient today. Questions were answered.  Further disposition pending results of labs. Discussed med's effects and SE's.    Over 20 minutes of exam, counseling, chart review, and critical decision making was performed.   Future Appointments  Date Time Provider [place]  10/10/2022 10:30 AM Alycia Rossetti, NP [facility] None  07/02/2023 10:00 AM Alycia Rossetti, NP [facility] None    ------------------------------------------------------------------------------------------------------------------  HPI BP 128/82   Pulse (!) 128   Temp 99.5 F (37.5 C)   Ht '5\' 5"'$  (1.651 m)   Wt 200 lb (90.7 kg)   SpO2 98%   BMI 33.28 kg/m   Patient presents for evaluation of a rash involving the chest, forearm, shoulder, upper body, and upper extremity. Rash started 5 days ago. Lesions are pink, purulent, white, and pustules, and raised in texture. Rash has not changed over time. Rash is painful and is pruritic. Associated symptoms: fever and nausea. Patient denies: abdominal pain, headache, and vomiting. Patient has had contacts with similar rash. Notes a hx of MRSA.  Patient has had new exposures  including being a hot tub 5 days ago.  The next  day the rash appeared.  She did have a E-Visit on 08/28/22 and was treated for folliculitis.  She has been taking Bactrim and Benadryl with little benefit.    Past Medical History:  Diagnosis Date   ADD (attention deficit disorder)    Anxiety    Gestational diabetes    Hepatic steatosis 01/14/2020   Hyperlipidemia    S/P cesarean section 12/03/2016     No Known Allergies  Current Outpatient Medications on File Prior to Visit  Medication Sig   amphetamine-dextroamphetamine (ADDERALL) 20 MG tablet Take  1 to 2 tablets  Daily  for ADD   cephALEXin (KEFLEX) 500 MG capsule Take 1 capsule (500 mg total) by mouth 3 (three) times daily.   LO LOESTRIN FE 1 MG-10 MCG / 10 MCG tablet Take 1 tablet by mouth daily.   Semaglutide-Weight Management (WEGOVY) 0.5 MG/0.5ML SOAJ Inject 0.5 mg into the skin once a week. (Patient not taking: Reported on 08/30/2022)   No current facility-administered medications on file prior to visit.    ROS: all negative except what is noted in the HPI.   Physical Exam:  BP 128/82   Pulse (!) 128   Temp 99.5 F (37.5 C)   Ht '5\' 5"'$  (1.651 m)   Wt 200 lb (90.7 kg)   SpO2 98%   BMI 33.28 kg/m   General Appearance: NAD.  Awake, conversant and cooperative. Eyes: PERRLA, EOMs intact.  Sclera white.  Conjunctiva without erythema. Sinuses: No frontal/maxillary tenderness.  No nasal discharge. Nares patent.  ENT/Mouth: Ext aud canals clear.  Bilateral TMs w/DOL and without erythema or bulging. Hearing intact.  Posterior pharynx without swelling or exudate.  Tonsils without swelling or erythema.  Neck: Supple.  No masses, nodules or thyromegaly. Respiratory: Effort is regular with non-labored breathing. Breath sounds are equal bilaterally without rales, rhonchi, wheezing or stridor.  Cardio: RRR with no MRGs. Brisk peripheral pulses without edema.  Abdomen: Active BS in all four quadrants.  Soft and non-tender without guarding, rebound tenderness, hernias or  masses. Lymphatics: Non tender without lymphadenopathy.  Musculoskeletal: Full ROM, 5/5 strength, normal ambulation.  No clubbing or cyanosis. Skin: Scattered areas of raised red pustules along upper torso, back, abdomen and BUE.  Surrounding skin appropriate color for ethnicity. Warm. Neuro: CN II-XII grossly normal. Normal muscle tone without cerebellar symptoms and intact sensation.   Psych: AO X 3,  appropriate mood and affect, insight and judgment.     Darrol Jump, NP 2:07 PM [facility]

## 2022-09-02 LAB — MRSA CULTURE
MICRO NUMBER:: 14658239
SPECIMEN QUALITY:: ADEQUATE

## 2022-09-13 ENCOUNTER — Telehealth: Payer: 59 | Admitting: Nurse Practitioner

## 2022-09-13 ENCOUNTER — Other Ambulatory Visit: Payer: Self-pay | Admitting: Nurse Practitioner

## 2022-09-13 ENCOUNTER — Other Ambulatory Visit (HOSPITAL_COMMUNITY): Payer: Self-pay

## 2022-09-13 ENCOUNTER — Encounter: Payer: Self-pay | Admitting: Nurse Practitioner

## 2022-09-13 DIAGNOSIS — L719 Rosacea, unspecified: Secondary | ICD-10-CM | POA: Diagnosis not present

## 2022-09-13 MED ORDER — DOXYCYCLINE HYCLATE 100 MG PO TABS: 100.0000 mg | ORAL_TABLET | Freq: Two times a day (BID) | ORAL | 0 refills | Status: DC

## 2022-09-13 MED ORDER — MUPIROCIN 2 % EX OINT
1.0000 | TOPICAL_OINTMENT | Freq: Two times a day (BID) | CUTANEOUS | 0 refills | Status: DC
  Filled 2022-09-13: qty 22, 11d supply, fill #0

## 2022-09-13 MED ORDER — AZELAIC ACID 15 % EX GEL: CUTANEOUS | 0 refills | Status: AC

## 2022-09-13 NOTE — Progress Notes (Signed)
E visit for Rosacea We are sorry that you are not feeling well. Here is how we plan to help! Based on what you shared with me it looks like you have Rosacea.  Rosacea is a common chronic skin condition that usually only affects the face and eyes.  Occasionally, the neck, chest, or other areas may be involved.  Characterized by redness, pimples, and broken blood vessels, rosacea tends to begin after middle age (between the ages of 29 and 45).  It is more common in fair-skinned people and women in menopause. It may appear differently in dark skinned people but rosacea effects all ethnic groups.  The cause of rosacea is not fully understood. We do know that rosacea is worsened by various trigger factors including, spicy or hot foods, hot beverages such as coffee or tea, alcohol, and sun exposure just to name a few.  Signs of rosacea may vary greatly from person to person. In some individuals it may only flare up from time to time.   I have prescribed: An oral antibiotic that may lessen the redness of your skin called doxycycline 100mg  daily. * YOu will need to contact your PCP for long term treatment of rosacea  HOME CARE: Keep a record of triggers, such as stress, weather, or certain foods or drinks. Consider limiting hot or spicy foods and alcohol. Always use sunscreen that protects against UVA and UVB rays and has a sun-protecting factor (SPF) of 15 or higher. Avoid putting steroids on the skin sores. Steroids may make rosacea worse.  You may use small amounts of water based cosmetic while using this medication.  Apply cosmetics after cream has dried. If you shave your face, use an electric razor Don't scrub your skin or use sponges, brushes, or other abrasive tools. Doing so can irritate your skin. GET HELP RIGHT AWAY IF: If your rosacea gets worse or is not better within 4 weeks. If a new skin condition or rash develops. Loss of feeling or tingling of treated area Nausea  MAKE SURE YOU    Understand these instructions. Will watch your condition. Will get help right away if you are not doing well or get worse.   Thank you for choosing an e-visit.  Your e-visit answers were reviewed by a board certified advanced clinical practitioner to complete your personal care plan. Depending upon the condition, your plan could have included both over the counter or prescription medications.  Please review your pharmacy choice. Make sure the pharmacy is open so you can pick up prescription now. If there is a problem, you may contact your provider through [employer] and have the prescription routed to another pharmacy.  Your safety is important to [place]. If you have drug allergies check your prescription carefully.   For the next 24 hours you can use MyChart to ask questions about today's visit, request a non-urgent call back, or ask for a work or school excuse. You will get an email in the next two days asking about your experience. I hope that your e-visit has been valuable and will speed your recovery.  Mary-Margaret Hassell Done, FNP   5-10 minutes spent reviewing and documenting in chart.

## 2022-09-13 NOTE — Addendum Note (Signed)
Addended by: Mar Daring on: 09/13/2022 02:20 PM   Modules accepted: Orders

## 2022-09-21 ENCOUNTER — Other Ambulatory Visit (HOSPITAL_COMMUNITY): Payer: Self-pay

## 2022-09-28 ENCOUNTER — Other Ambulatory Visit: Payer: Self-pay | Admitting: Nurse Practitioner

## 2022-09-28 DIAGNOSIS — F988 Other specified behavioral and emotional disorders with onset usually occurring in childhood and adolescence: Secondary | ICD-10-CM

## 2022-09-28 MED ORDER — AMPHETAMINE-DEXTROAMPHETAMINE 20 MG PO TABS: ORAL_TABLET | ORAL | 0 refills | Status: DC

## 2022-10-09 NOTE — Progress Notes (Unsigned)
Assessment and Plan:  Tonya [name] was seen today for acute visit.  Diagnoses and all orders for this visit:  Abnormal glucose/History of gestational diabetes Continue focusing on diet, exercise and weight loss -     Semaglutide,0.25 or 0.5MG /DOS, 2 MG/3ML SOPN; Inject 0.25 mg into the skin once a week.   Attention deficit disorder (ADD) without hyperactivity Restart Adderall BID as needed Focus on diet and exercise as well as good sleep hygiene since working nights -     amphetamine-dextroamphetamine (ADDERALL) 20 MG tablet; Take  1 to 2 tablets  Daily  for ADD  Obesity (BMI 30.0-34.9) Long discussion about weight loss, diet, and exercise Recommended diet heavy in fruits and veggies and low in animal meats, cheeses, and dairy products, appropriate calorie intake Patient will work on decreasing saturated fats and simple carbs Follow up at next visit  Anxiety Had side effects related to Wellbutrin so plan to wean off Use Buspar 5 mg BID as needed Continue diet exercise and practice good sleep hygiene Relaxation techniques If symptoms are not controlled with Buspar notify the office  Obesity Long discussion about weight loss, diet, and exercise Recommended diet heavy in fruits and veggies and low in animal meats, cheeses, and dairy products, appropriate calorie intake Patient will work on decreasing saturated fats and simple carbs.  Increase lean proteins and exercise Follow up at next visit  Hyperlipidemia Continue diet and exercise - Lipid panel  Elevated LFT's Limit Tylenol and Alcohol Work on diet, exercise and weight loss -CMP  Vit D deficiency Continue Vit D supplementation to maintain value in therapeutic level of 60-100   Medication Management - Magnesium   Further disposition pending results of labs. Discussed med's effects and SE's.   Over 30 minutes of exam, counseling, chart review, and critical decision making was performed.   Future Appointments  Date  Time Provider Department Center  10/10/2022 10:30 AM Raynelle Dick, NP [facility] None  07/02/2023 10:00 AM Raynelle Dick, NP [facility] None    ------------------------------------------------------------------------------------------------------------------   HPI There were no vitals taken for this visit.  37 y.o.female presents for headache and blurred vision which have started occurring since initiation of Wellbutrin by OB/GYN 9 weeks ago. Started taking the day after she was born for postpartum blues.  After about 5 weeks she started having blurred vision and headaches. Describes headaches as a dull ache that occur all day and Ibuprofen or Tylenol does not relieve headache. She was also given Buspar but had not started. She stopped breastfeeding and would like to start back on her   She has had headache and questioning elevation of BP since starting Wellbutrin BP Readings from Last 3 Encounters:  08/30/22 128/82  06/29/22 110/70  03/09/22 134/82   BMI is There is no height or weight on file to calculate BMI., she has been working on diet and exercise. She is 10 weeks postpartum and did have gestational diabetes with her pregnancies.  She is very interested in weight loss. Cannot take Phentermine as she is on Adderall for ADD Wt Readings from Last 3 Encounters:  08/30/22 200 lb (90.7 kg)  06/29/22 203 lb 12.8 oz (92.4 kg)  03/09/22 197 lb 3.2 oz (89.4 kg)      Past Medical History:  Diagnosis Date   ADD (attention deficit disorder)    Anxiety    Gestational diabetes    Hepatic steatosis 01/14/2020   Hyperlipidemia    S/P cesarean section 12/03/2016     No Known  Allergies  Current Outpatient Medications on File Prior to Visit  Medication Sig   amphetamine-dextroamphetamine (ADDERALL) 20 MG tablet Take  1 to 2 tablets  Daily  for ADD   Azelaic Acid 15 % gel After skin is thoroughly washed and patted dry, gently but thoroughly massage a thin film of azelaic acid cream  into the affected area twice daily, in the morning and evening.   cephALEXin (KEFLEX) 500 MG capsule Take 1 capsule (500 mg total) by mouth 3 (three) times daily.   doxycycline (VIBRA-TABS) 100 MG tablet Take 1 tablet (100 mg total) by mouth 2 (two) times daily. 1 po bid   fluconazole (DIFLUCAN) 150 MG tablet Take 1 tablet (150 mg) at the sign of symptoms.  If not resolved after 72 hours may take additional 150 mg dosage.   LO LOESTRIN FE 1 MG-10 MCG / 10 MCG tablet Take 1 tablet by mouth daily.   mupirocin ointment (BACTROBAN) 2 % Apply to affected area 2 times a day   ondansetron (ZOFRAN-ODT) 4 MG disintegrating tablet Dissolve 1 tablet (4 mg total) by mouth every 8 (eight) hours as needed for nausea or vomiting.   Semaglutide-Weight Management (WEGOVY) 0.5 MG/0.5ML SOAJ Inject 0.5 mg into the skin once a week. (Patient not taking: Reported on 08/30/2022)   No current facility-administered medications on file prior to visit.    ROS: all negative except above.   Physical Exam:  There were no vitals taken for this visit.  General Appearance: Well nourished, in no apparent distress. Eyes: PERRLA, EOMs, conjunctiva no swelling or erythema Sinuses: No Frontal/maxillary tenderness ENT/Mouth: Ext aud canals clear, TMs without erythema, bulging. No erythema, swelling, or exudate on post pharynx.  Tonsils not swollen or erythematous. Hearing normal.  Neck: Supple, thyroid normal.  Respiratory: Respiratory effort normal, BS equal bilaterally without rales, rhonchi, wheezing or stridor.  Cardio: RRR with no MRGs. Brisk peripheral pulses without edema.  Abdomen: Soft, + BS.  Non tender, no guarding, rebound, hernias, masses. Lymphatics: Non tender without lymphadenopathy.  Musculoskeletal: Full ROM, 5/5 strength, normal gait.  Skin: Warm, dry without rashes, lesions, ecchymosis.  Neuro: Cranial nerves intact. Normal muscle tone, no cerebellar symptoms. Sensation intact.  Psych: Awake and oriented X  3, normal affect, Insight and Judgment appropriate.     Raynelle Dick, NP 12:18 PM [facility]

## 2022-10-10 ENCOUNTER — Ambulatory Visit: Payer: 59 | Admitting: Nurse Practitioner

## 2022-10-10 ENCOUNTER — Encounter: Payer: Self-pay | Admitting: Nurse Practitioner

## 2022-10-10 VITALS — BP 112/82 | HR 78 | Temp 97.5°F | Ht 65.0 in | Wt 196.8 lb

## 2022-10-10 DIAGNOSIS — F419 Anxiety disorder, unspecified: Secondary | ICD-10-CM | POA: Diagnosis not present

## 2022-10-10 DIAGNOSIS — F988 Other specified behavioral and emotional disorders with onset usually occurring in childhood and adolescence: Secondary | ICD-10-CM

## 2022-10-10 DIAGNOSIS — Z79899 Other long term (current) drug therapy: Secondary | ICD-10-CM

## 2022-10-10 DIAGNOSIS — E559 Vitamin D deficiency, unspecified: Secondary | ICD-10-CM | POA: Diagnosis not present

## 2022-10-10 DIAGNOSIS — R7309 Other abnormal glucose: Secondary | ICD-10-CM

## 2022-10-10 DIAGNOSIS — R7989 Other specified abnormal findings of blood chemistry: Secondary | ICD-10-CM

## 2022-10-10 DIAGNOSIS — E669 Obesity, unspecified: Secondary | ICD-10-CM | POA: Diagnosis not present

## 2022-10-10 DIAGNOSIS — H6121 Impacted cerumen, right ear: Secondary | ICD-10-CM

## 2022-10-10 DIAGNOSIS — L709 Acne, unspecified: Secondary | ICD-10-CM

## 2022-10-10 DIAGNOSIS — Z8632 Personal history of gestational diabetes: Secondary | ICD-10-CM | POA: Diagnosis not present

## 2022-10-10 DIAGNOSIS — E782 Mixed hyperlipidemia: Secondary | ICD-10-CM

## 2022-10-10 DIAGNOSIS — Z9109 Other allergy status, other than to drugs and biological substances: Secondary | ICD-10-CM

## 2022-10-10 MED ORDER — BENZOYL PEROXIDE-ERYTHROMYCIN 5-3 % EX GEL
CUTANEOUS | 0 refills | Status: DC
Start: 2022-10-10 — End: 2023-04-17

## 2022-10-10 MED ORDER — MUPIROCIN 2 % EX OINT: 1.0000 | TOPICAL_OINTMENT | Freq: Two times a day (BID) | CUTANEOUS | 0 refills | Status: AC

## 2022-10-10 MED ORDER — FLUTICASONE PROPIONATE 50 MCG/ACT NA SUSP: 2.0000 | Freq: Every day | NASAL | 1 refills | Status: DC

## 2022-10-10 NOTE — Patient Instructions (Signed)
Psychiatry- Dr. Yong Channel  Fair life protein shakes Eat more frequently - try not to go more than 6 hours without protein Aim for 90 grams of protein a day- 30 breakfast/30 lunch 30 dinner Try to keep net carbs less than 50 Net Carbs=Total Carbs-fiber- sugar alcohols Exercise heartrate 120-140(fat burning zone)- walking 20-30 minutes 4 days a week

## 2022-10-11 LAB — CBC WITH DIFFERENTIAL/PLATELET
Absolute Monocytes: 339 cells/uL (ref 200–950)
Basophils Absolute: 58 cells/uL (ref 0–200)
Basophils Relative: 0.9 %
Eosinophils Absolute: 109 cells/uL (ref 15–500)
Eosinophils Relative: 1.7 %
HCT: 42.6 % (ref 35.0–45.0)
Hemoglobin: 14.1 g/dL (ref 11.7–15.5)
Lymphs Abs: 1632 cells/uL (ref 850–3900)
MCH: 29.9 pg (ref 27.0–33.0)
MCHC: 33.1 g/dL (ref 32.0–36.0)
MCV: 90.4 fL (ref 80.0–100.0)
MPV: 10.4 fL (ref 7.5–12.5)
Monocytes Relative: 5.3 %
Neutro Abs: 4262 cells/uL (ref 1500–7800)
Neutrophils Relative %: 66.6 %
Platelets: 211 10*3/uL (ref 140–400)
RBC: 4.71 10*6/uL (ref 3.80–5.10)
RDW: 12.1 % (ref 11.0–15.0)
Total Lymphocyte: 25.5 %
WBC: 6.4 10*3/uL (ref 3.8–10.8)

## 2022-10-11 LAB — LIPID PANEL
Cholesterol: 186 mg/dL (ref <200)
HDL: 43 mg/dL — ABNORMAL LOW (ref >=50)
LDL Cholesterol (Calc): 117 mg/dL (calc) — ABNORMAL HIGH
Non-HDL Cholesterol (Calc): 143 mg/dL (calc) — ABNORMAL HIGH (ref <130)
Total CHOL/HDL Ratio: 4.3 (calc) (ref <5.0)
Triglycerides: 146 mg/dL (ref <150)

## 2022-10-11 LAB — COMPLETE METABOLIC PANEL WITH GFR
AG Ratio: 1.8 (calc) (ref 1.0–2.5)
ALT: 33 U/L — ABNORMAL HIGH (ref 6–29)
AST: 19 U/L (ref 10–30)
Albumin: 4.4 g/dL (ref 3.6–5.1)
Alkaline phosphatase (APISO): 50 U/L (ref 31–125)
BUN: 13 mg/dL (ref 7–25)
CO2: 27 mmol/L (ref 20–32)
Calcium: 9.4 mg/dL (ref 8.6–10.2)
Chloride: 107 mmol/L (ref 98–110)
Creat: 0.75 mg/dL (ref 0.50–0.97)
Globulin: 2.4 g/dL (calc) (ref 1.9–3.7)
Glucose, Bld: 95 mg/dL (ref 65–99)
Potassium: 4.3 mmol/L (ref 3.5–5.3)
Sodium: 140 mmol/L (ref 135–146)
Total Bilirubin: 0.4 mg/dL (ref 0.2–1.2)
Total Protein: 6.8 g/dL (ref 6.1–8.1)
eGFR: 106 mL/min/{1.73_m2} (ref >=60)

## 2022-10-11 LAB — MAGNESIUM: Magnesium: 2 mg/dL (ref 1.5–2.5)

## 2022-10-26 ENCOUNTER — Other Ambulatory Visit: Payer: Self-pay | Admitting: Nurse Practitioner

## 2022-10-26 DIAGNOSIS — F988 Other specified behavioral and emotional disorders with onset usually occurring in childhood and adolescence: Secondary | ICD-10-CM

## 2022-10-27 ENCOUNTER — Encounter: Payer: Self-pay | Admitting: Nurse Practitioner

## 2022-10-27 MED ORDER — AMPHETAMINE-DEXTROAMPHETAMINE 20 MG PO TABS
ORAL_TABLET | ORAL | 0 refills | Status: DC
Start: 2022-10-27 — End: 2022-11-26

## 2022-10-27 NOTE — Progress Notes (Deleted)
Assessment and Plan:  There are no diagnoses linked to this encounter.    Further disposition pending results of labs. Discussed med's effects and SE's.   Over 30 minutes of exam, counseling, chart review, and critical decision making was performed.   Future Appointments  Date Time Provider Department Center  10/30/2022  3:00 PM Raynelle Dick, NP [facility] None  04/11/2023 10:30 AM Raynelle Dick, NP [facility] None  07/02/2023 10:00 AM Raynelle Dick, NP [facility] None    ------------------------------------------------------------------------------------------------------------------   HPI There were no vitals taken for this visit. 37 y.o.female presents for  Past Medical History:  Diagnosis Date   ADD (attention deficit disorder)    Anxiety    Gestational diabetes    Hepatic steatosis 01/14/2020   Hyperlipidemia    S/P cesarean section 12/03/2016     No Known Allergies  Current Outpatient Medications on File Prior to Visit  Medication Sig   amphetamine-dextroamphetamine (ADDERALL) 20 MG tablet Take  1 to 2 tablets  Daily  for ADD   Azelaic Acid 15 % gel After skin is thoroughly washed and patted dry, gently but thoroughly massage a thin film of azelaic acid cream into the affected area twice daily, in the morning and evening.   benzoyl peroxide-erythromycin (BENZAMYCIN) gel Apply to affected area 2 times daily   fluticasone (FLONASE) 50 MCG/ACT nasal spray Place 2 sprays into both nostrils at bedtime.   LO LOESTRIN FE 1 MG-10 MCG / 10 MCG tablet Take 1 tablet by mouth daily.   mupirocin ointment (BACTROBAN) 2 % Apply to affected area 2 times a day   ondansetron (ZOFRAN-ODT) 4 MG disintegrating tablet Dissolve 1 tablet (4 mg total) by mouth every 8 (eight) hours as needed for nausea or vomiting.   No current facility-administered medications on file prior to visit.    ROS: all negative except above.   Physical Exam:  There were no vitals taken for this  visit.  General Appearance: Well nourished, in no apparent distress. Eyes: PERRLA, EOMs, conjunctiva no swelling or erythema Sinuses: No Frontal/maxillary tenderness ENT/Mouth: Ext aud canals clear, TMs without erythema, bulging. No erythema, swelling, or exudate on post pharynx.  Tonsils not swollen or erythematous. Hearing normal.  Neck: Supple, thyroid normal.  Respiratory: Respiratory effort normal, BS equal bilaterally without rales, rhonchi, wheezing or stridor.  Cardio: RRR with no MRGs. Brisk peripheral pulses without edema.  Abdomen: Soft, + BS.  Non tender, no guarding, rebound, hernias, masses. Lymphatics: Non tender without lymphadenopathy.  Musculoskeletal: Full ROM, 5/5 strength, normal gait.  Skin: Warm, dry without rashes, lesions, ecchymosis.  Neuro: Cranial nerves intact. Normal muscle tone, no cerebellar symptoms. Sensation intact.  Psych: Awake and oriented X 3, normal affect, Insight and Judgment appropriate.     Raynelle Dick, NP 11:24 AM [facility]

## 2022-10-30 ENCOUNTER — Ambulatory Visit: Payer: 59 | Admitting: Nurse Practitioner

## 2022-10-31 NOTE — Progress Notes (Unsigned)
Assessment and Plan:  There are no diagnoses linked to this encounter.    Further disposition pending results of labs. Discussed med's effects and SE's.   Over 30 minutes of exam, counseling, chart review, and critical decision making was performed.   Future Appointments  Date Time Provider Department Center  11/01/2022 11:45 AM Raynelle Dick, NP [facility] None  04/11/2023 10:30 AM Raynelle Dick, NP [facility] None  07/02/2023 10:00 AM Raynelle Dick, NP [facility] None    ------------------------------------------------------------------------------------------------------------------   HPI There were no vitals taken for this visit. 37 y.o.female presents for  Past Medical History:  Diagnosis Date   ADD (attention deficit disorder)    Anxiety    Gestational diabetes    Hepatic steatosis 01/14/2020   Hyperlipidemia    S/P cesarean section 12/03/2016     No Known Allergies  Current Outpatient Medications on File Prior to Visit  Medication Sig   amphetamine-dextroamphetamine (ADDERALL) 20 MG tablet Take  1 to 2 tablets  Daily  for ADD   Azelaic Acid 15 % gel After skin is thoroughly washed and patted dry, gently but thoroughly massage a thin film of azelaic acid cream into the affected area twice daily, in the morning and evening.   benzoyl peroxide-erythromycin (BENZAMYCIN) gel Apply to affected area 2 times daily   fluticasone (FLONASE) 50 MCG/ACT nasal spray Place 2 sprays into both nostrils at bedtime.   LO LOESTRIN FE 1 MG-10 MCG / 10 MCG tablet Take 1 tablet by mouth daily.   mupirocin ointment (BACTROBAN) 2 % Apply to affected area 2 times a day   ondansetron (ZOFRAN-ODT) 4 MG disintegrating tablet Dissolve 1 tablet (4 mg total) by mouth every 8 (eight) hours as needed for nausea or vomiting.   No current facility-administered medications on file prior to visit.    ROS: all negative except above.   Physical Exam:  There were no vitals taken for this  visit.  General Appearance: Well nourished, in no apparent distress. Eyes: PERRLA, EOMs, conjunctiva no swelling or erythema Sinuses: No Frontal/maxillary tenderness ENT/Mouth: Ext aud canals clear, TMs without erythema, bulging. No erythema, swelling, or exudate on post pharynx.  Tonsils not swollen or erythematous. Hearing normal.  Neck: Supple, thyroid normal.  Respiratory: Respiratory effort normal, BS equal bilaterally without rales, rhonchi, wheezing or stridor.  Cardio: RRR with no MRGs. Brisk peripheral pulses without edema.  Abdomen: Soft, + BS.  Non tender, no guarding, rebound, hernias, masses. Lymphatics: Non tender without lymphadenopathy.  Musculoskeletal: Full ROM, 5/5 strength, normal gait.  Skin: Warm, dry without rashes, lesions, ecchymosis.  Neuro: Cranial nerves intact. Normal muscle tone, no cerebellar symptoms. Sensation intact.  Psych: Awake and oriented X 3, normal affect, Insight and Judgment appropriate.     Raynelle Dick, NP 10:39 AM [facility]

## 2022-11-01 ENCOUNTER — Other Ambulatory Visit (HOSPITAL_COMMUNITY): Payer: Self-pay

## 2022-11-01 ENCOUNTER — Ambulatory Visit (INDEPENDENT_AMBULATORY_CARE_PROVIDER_SITE_OTHER): Payer: 59 | Admitting: Nurse Practitioner

## 2022-11-01 ENCOUNTER — Encounter: Payer: Self-pay | Admitting: Nurse Practitioner

## 2022-11-01 VITALS — BP 140/82 | HR 93 | Temp 98.1°F | Ht 65.0 in | Wt 197.8 lb

## 2022-11-01 DIAGNOSIS — J0110 Acute frontal sinusitis, unspecified: Secondary | ICD-10-CM | POA: Diagnosis not present

## 2022-11-01 DIAGNOSIS — N6081 Other benign mammary dysplasias of right breast: Secondary | ICD-10-CM | POA: Diagnosis not present

## 2022-11-01 MED ORDER — IPRATROPIUM BROMIDE 0.03 % NA SOLN
2.0000 | Freq: Three times a day (TID) | NASAL | 2 refills | Status: AC
Start: 2022-11-01 — End: 2024-04-29

## 2022-11-01 MED ORDER — DOXYCYCLINE MONOHYDRATE 100 MG PO CAPS
100.0000 mg | ORAL_CAPSULE | Freq: Two times a day (BID) | ORAL | 0 refills | Status: DC
Start: 2022-11-01 — End: 2022-11-01
  Filled 2022-11-01: qty 28, 14d supply, fill #0

## 2022-11-01 MED ORDER — DOXYCYCLINE MONOHYDRATE 100 MG PO CAPS
100.0000 mg | ORAL_CAPSULE | Freq: Two times a day (BID) | ORAL | 0 refills | Status: DC
Start: 2022-11-01 — End: 2023-04-17

## 2022-11-01 MED ORDER — DEXAMETHASONE 4 MG PO TABS
ORAL_TABLET | ORAL | 0 refills | Status: DC
Start: 2022-11-01 — End: 2023-04-17

## 2022-11-10 IMAGING — CR DG CHEST 2V
2 series · 2 of 2 positions shown · non-contrast
Comparison: September 29, 2018.

CLINICAL DATA: Cough.

EXAM:
CHEST - 2 VIEW

[w chest pa]
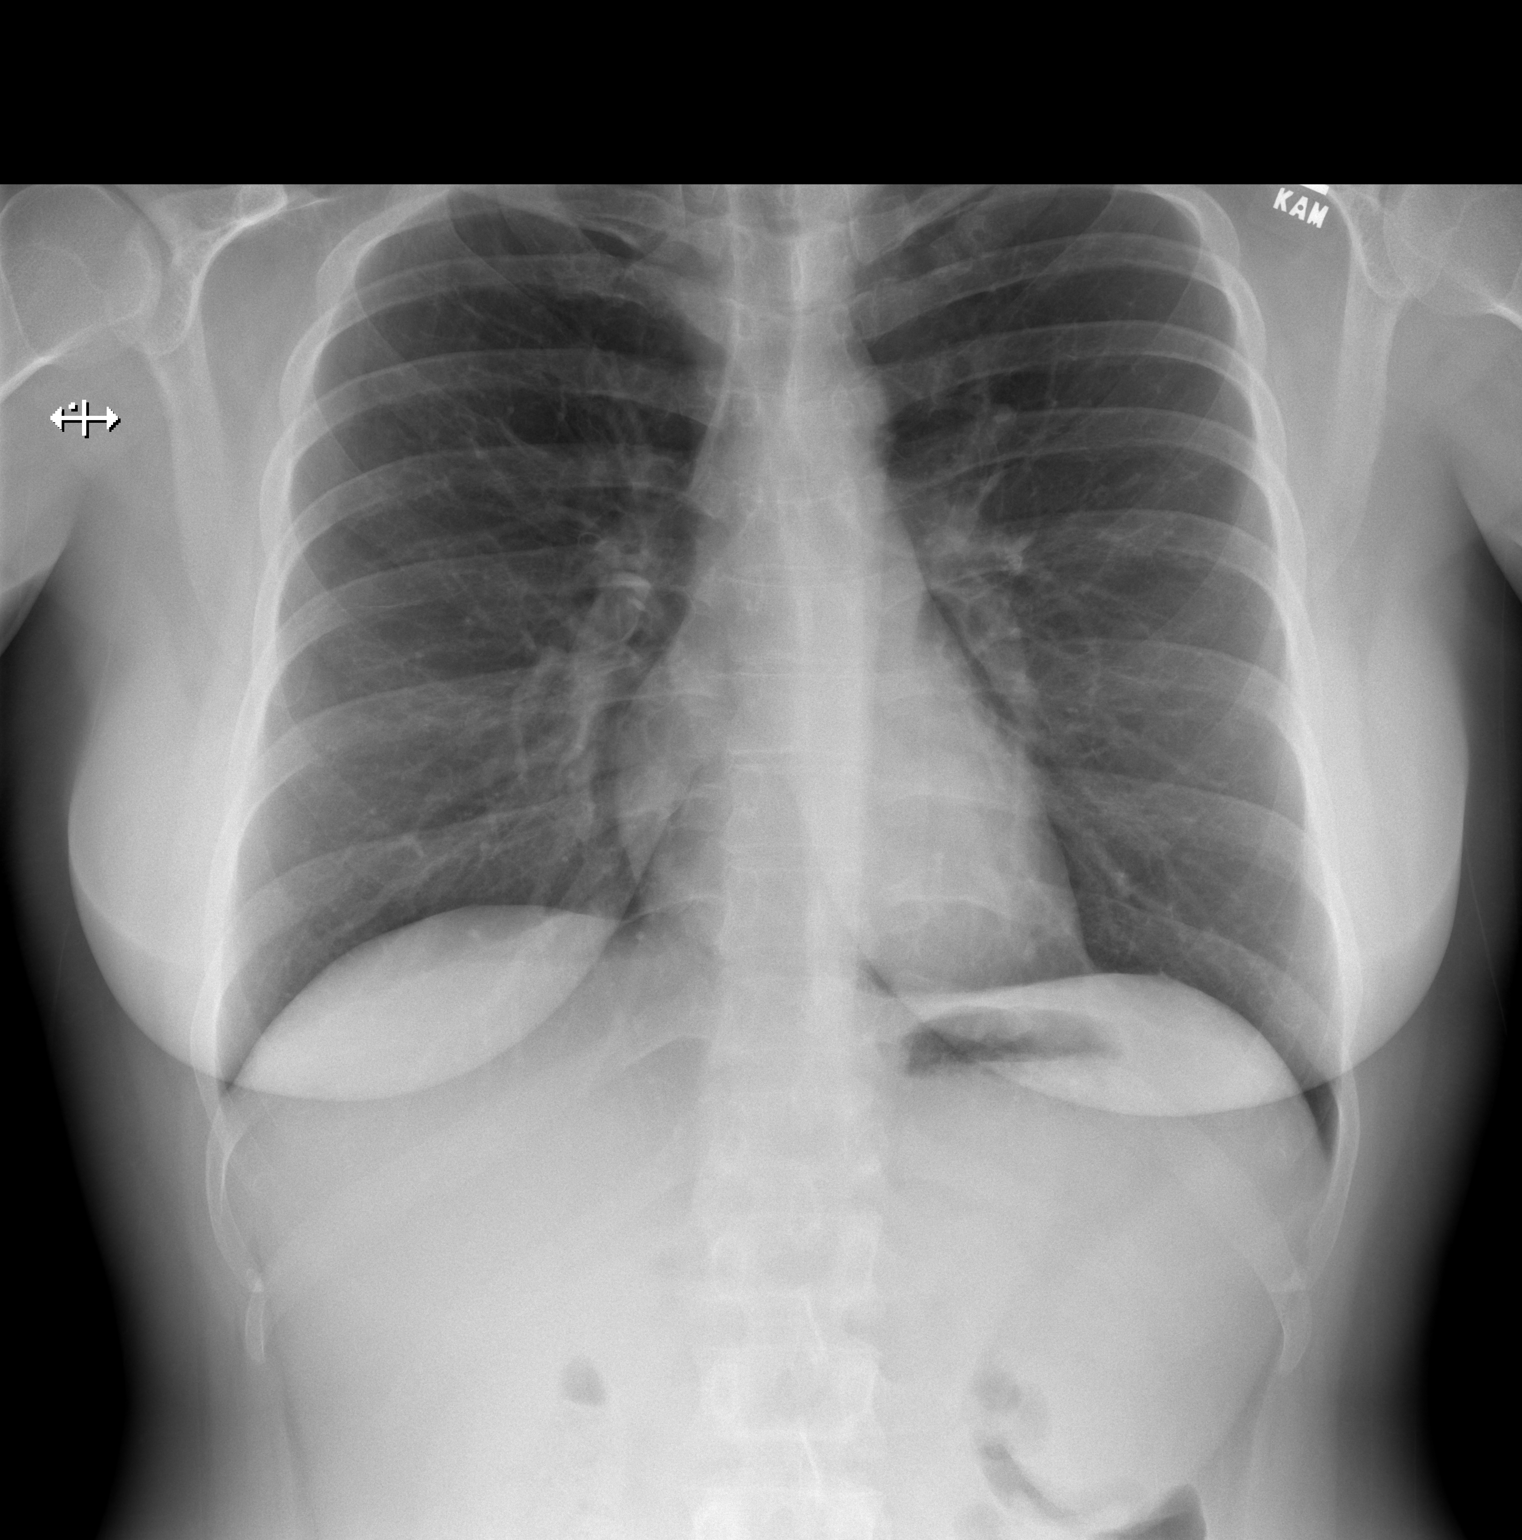

[w chest lat]
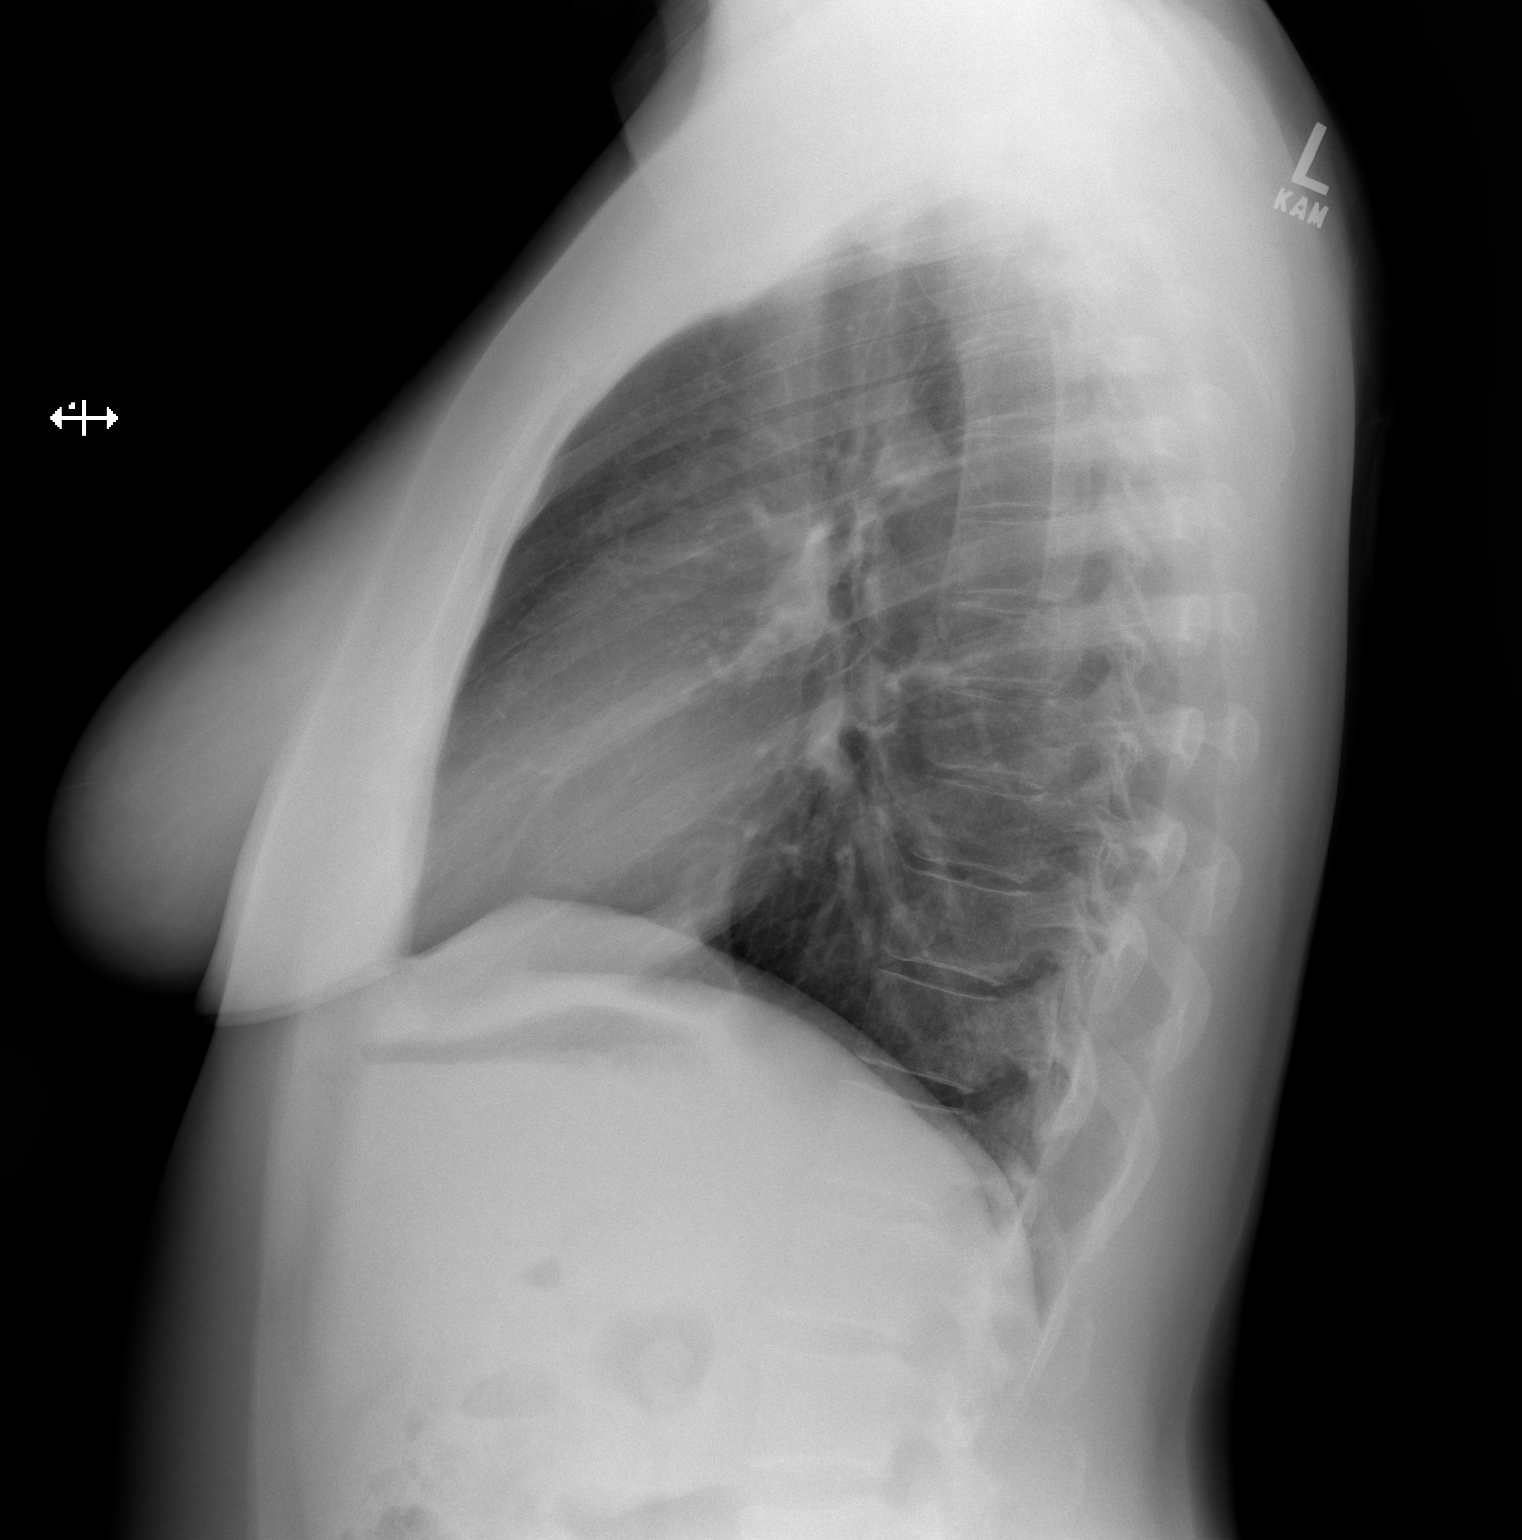

[2 of 2 positions shown; findings below may reference images not displayed]

FINDINGS: The heart size and mediastinal contours are within normal limits.
Both lungs are clear. The visualized skeletal structures are
unremarkable.
IMPRESSION: No active cardiopulmonary disease.

## 2022-11-26 ENCOUNTER — Other Ambulatory Visit: Payer: Self-pay | Admitting: Nurse Practitioner

## 2022-11-26 DIAGNOSIS — F988 Other specified behavioral and emotional disorders with onset usually occurring in childhood and adolescence: Secondary | ICD-10-CM

## 2022-11-27 MED ORDER — AMPHETAMINE-DEXTROAMPHETAMINE 20 MG PO TABS
ORAL_TABLET | ORAL | 0 refills | Status: DC
Start: 2022-11-27 — End: 2022-12-26

## 2022-12-07 ENCOUNTER — Encounter: Payer: Self-pay | Admitting: Nurse Practitioner

## 2022-12-08 ENCOUNTER — Other Ambulatory Visit: Payer: Self-pay | Admitting: Nurse Practitioner

## 2022-12-08 DIAGNOSIS — R300 Dysuria: Secondary | ICD-10-CM

## 2022-12-08 MED ORDER — NITROFURANTOIN MONOHYD MACRO 100 MG PO CAPS
100.0000 mg | ORAL_CAPSULE | Freq: Two times a day (BID) | ORAL | 0 refills | Status: AC
Start: 2022-12-08 — End: 2022-12-15

## 2022-12-12 ENCOUNTER — Encounter: Payer: Self-pay | Admitting: Nurse Practitioner

## 2022-12-13 ENCOUNTER — Other Ambulatory Visit: Payer: Self-pay | Admitting: Nurse Practitioner

## 2022-12-13 DIAGNOSIS — N6314 Unspecified lump in the right breast, lower inner quadrant: Secondary | ICD-10-CM

## 2022-12-15 ENCOUNTER — Other Ambulatory Visit: Payer: Self-pay | Admitting: Nurse Practitioner

## 2022-12-15 DIAGNOSIS — N6314 Unspecified lump in the right breast, lower inner quadrant: Secondary | ICD-10-CM

## 2022-12-19 ENCOUNTER — Ambulatory Visit: Payer: 59

## 2022-12-19 ENCOUNTER — Ambulatory Visit
Admission: RE | Admit: 2022-12-19 | Discharge: 2022-12-19 | Disposition: A | Discharge status: Home / Self Care | Payer: 59 | Source: Ambulatory Visit | Attending: Nurse Practitioner | Admitting: Nurse Practitioner

## 2022-12-19 DIAGNOSIS — N6314 Unspecified lump in the right breast, lower inner quadrant: Secondary | ICD-10-CM

## 2022-12-19 DIAGNOSIS — N644 Mastodynia: Secondary | ICD-10-CM | POA: Diagnosis not present

## 2022-12-26 ENCOUNTER — Other Ambulatory Visit: Payer: Self-pay | Admitting: Nurse Practitioner

## 2022-12-26 DIAGNOSIS — F988 Other specified behavioral and emotional disorders with onset usually occurring in childhood and adolescence: Secondary | ICD-10-CM

## 2022-12-27 MED ORDER — AMPHETAMINE-DEXTROAMPHETAMINE 20 MG PO TABS
ORAL_TABLET | ORAL | 0 refills | Status: DC
Start: 2022-12-27 — End: 2023-01-26

## 2023-01-26 ENCOUNTER — Other Ambulatory Visit: Payer: Self-pay | Admitting: Nurse Practitioner

## 2023-01-26 DIAGNOSIS — F988 Other specified behavioral and emotional disorders with onset usually occurring in childhood and adolescence: Secondary | ICD-10-CM

## 2023-01-26 MED ORDER — AMPHETAMINE-DEXTROAMPHETAMINE 20 MG PO TABS
ORAL_TABLET | ORAL | 0 refills | Status: DC
Start: 2023-01-26 — End: 2023-02-26

## 2023-02-14 ENCOUNTER — Telehealth: Payer: 59 | Admitting: Physician Assistant

## 2023-02-14 DIAGNOSIS — R3989 Other symptoms and signs involving the genitourinary system: Secondary | ICD-10-CM

## 2023-02-14 MED ORDER — NITROFURANTOIN MONOHYD MACRO 100 MG PO CAPS
100.0000 mg | ORAL_CAPSULE | Freq: Two times a day (BID) | ORAL | 0 refills | Status: AC
Start: 2023-02-14 — End: 2023-02-19

## 2023-02-14 NOTE — Progress Notes (Signed)
E-Visit for Urinary Problems  We are sorry that you are not feeling well.  Here is how we plan to help!  Based on what you shared with me it looks like you most likely have a simple urinary tract infection.  A UTI (Urinary Tract Infection) is a bacterial infection of the bladder.  Most cases of urinary tract infections are simple to treat but a key part of your care is to encourage you to drink plenty of fluids and watch your symptoms carefully.  I have prescribed MacroBid 100 mg twice a day for 5 days.  Your symptoms should gradually improve. Call us if the burning in your urine worsens, you develop worsening fever, back pain or pelvic pain or if your symptoms do not resolve after completing the antibiotic.  Urinary tract infections can be prevented by drinking plenty of water to keep your body hydrated.  Also be sure when you wipe, wipe from front to back and don't hold it in!  If possible, empty your bladder every 4 hours.  HOME CARE Drink plenty of fluids Compete the full course of the antibiotics even if the symptoms resolve Remember, when you need to go.go. Holding in your urine can increase the likelihood of getting a UTI! GET HELP RIGHT AWAY IF: You cannot urinate You get a high fever Worsening back pain occurs You see blood in your urine You feel sick to your stomach or throw up You feel like you are going to pass out  MAKE SURE YOU  Understand these instructions. Will watch your condition. Will get help right away if you are not doing well or get worse.   Thank you for choosing an e-visit.  Your e-visit answers were reviewed by a board certified advanced clinical practitioner to complete your personal care plan. Depending upon the condition, your plan could have included both over the counter or prescription medications.  Please review your pharmacy choice. Make sure the pharmacy is open so you can pick up prescription now. If there is a problem, you may contact your  provider through [employer] and have the prescription routed to another pharmacy.  Your safety is important to [place]. If you have drug allergies check your prescription carefully.   For the next 24 hours you can use MyChart to ask questions about today's visit, request a non-urgent call back, or ask for a work or school excuse. You will get an email in the next two days asking about your experience. I hope that your e-visit has been valuable and will speed your recovery.   I have spent 5 minutes in review of e-visit questionnaire, review and updating patient chart, medical decision making and response to patient.   Gilberto Better, PA-C

## 2023-02-26 ENCOUNTER — Other Ambulatory Visit: Payer: Self-pay | Admitting: Nurse Practitioner

## 2023-02-26 DIAGNOSIS — F988 Other specified behavioral and emotional disorders with onset usually occurring in childhood and adolescence: Secondary | ICD-10-CM

## 2023-02-27 MED ORDER — AMPHETAMINE-DEXTROAMPHETAMINE 20 MG PO TABS
ORAL_TABLET | ORAL | 0 refills | Status: AC
Start: 2023-02-27 — End: ?

## 2023-03-07 DIAGNOSIS — D3131 Benign neoplasm of right choroid: Secondary | ICD-10-CM | POA: Diagnosis not present

## 2023-03-07 DIAGNOSIS — H52223 Regular astigmatism, bilateral: Secondary | ICD-10-CM | POA: Diagnosis not present

## 2023-03-28 ENCOUNTER — Other Ambulatory Visit: Payer: Self-pay | Admitting: Nurse Practitioner

## 2023-03-28 DIAGNOSIS — F988 Other specified behavioral and emotional disorders with onset usually occurring in childhood and adolescence: Secondary | ICD-10-CM

## 2023-03-29 ENCOUNTER — Other Ambulatory Visit: Payer: Self-pay | Admitting: Nurse Practitioner

## 2023-03-29 ENCOUNTER — Encounter: Payer: Self-pay | Admitting: Nurse Practitioner

## 2023-03-29 DIAGNOSIS — F988 Other specified behavioral and emotional disorders with onset usually occurring in childhood and adolescence: Secondary | ICD-10-CM

## 2023-03-29 MED ORDER — AMPHETAMINE-DEXTROAMPHETAMINE 20 MG PO TABS
ORAL_TABLET | ORAL | 0 refills | Status: DC
Start: 2023-03-29 — End: 2023-04-30

## 2023-04-10 NOTE — Progress Notes (Deleted)
Assessment and Plan:  Tonya [name] was seen today for acute visit.  Diagnoses and all orders for this visit:  Abnormal glucose/History of gestational diabetes Continue focusing on diet, exercise and weight loss   Attention deficit disorder (ADD) without hyperactivity Continue Adderall BID as needed Focus on diet and exercise as well as good sleep hygiene since working nights -     amphetamine-dextroamphetamine (ADDERALL) 20 MG tablet; Take  1 to 2 tablets  Daily  for ADD  Obesity (BMI 30.0-34.9) Fair life protein shakes Eat more frequently - try not to go more than 6 hours without protein Aim for 90 grams of protein a day- 30 breakfast/30 lunch 30 dinner Try to keep net carbs less than 50 Net Carbs=Total Carbs-fiber- sugar alcohols Exercise heartrate 120-140(fat burning zone)- walking 20-30 minutes 4 days a week   Anxiety Did not tolerate Wellbutrin or Buspar Continue diet exercise and practice good sleep hygiene Relaxation techniques Refer to psychology, reluctant to start SSRI due to weight gain  Hyperlipidemia Continue diet and exercise - Lipid panel - CBC -CMP  Allergies Use claritin or Allegra daily, zyrtec makes her sleepy Flonase 2 sprays each nostril daily Monitor symptoms   Elevated LFT's Limit Tylenol and Alcohol Work on diet, exercise and weight loss -CMP  Vit D deficiency Continue Vit D supplementation to maintain value in therapeutic level of 60-100   Medication management -     CBC with Differential/Platelet -     COMPLETE METABOLIC PANEL WITH GFR -     Lipid panel -     Magnesium  Environmental allergies Take generic claritin or allegra, cannot tolerate zyrtec due to somnolence on med Begin Flonase Monitor symptoms -     fluticasone (FLONASE) 50 MCG/ACT nasal spray; Place 2 sprays into both nostrils at bedtime.    Further disposition pending results of labs. Discussed med's effects and SE's.   Over 30 minutes of exam, counseling, chart  review, and critical decision making was performed.   Future Appointments  Date Time Provider Department Center  04/11/2023 10:30 AM Raynelle Dick, NP [facility] None  07/02/2023 10:00 AM Raynelle Dick, NP [facility] None    ------------------------------------------------------------------------------------------------------------------   HPI There were no vitals taken for this visit.  37 y.o.female presents for 3 months follow up has Anxiety; ADD (attention deficit disorder); Hyperlipidemia; Obesity (BMI 30.0-34.9); Continuous RUQ abdominal pain; Vitamin D deficiency; Previous cesarean section; and S/P cesarean section on their problem list.   She is having a lot of post nasal drip. She is not currently on allergy medications.  She has a very raspy voice currently and had lost voice past weekend.   BP is well controlled without medications.  Denies headaches, chest pain, shortness of breath and dizziness BP Readings from Last 3 Encounters:  11/01/22 (!) 140/82  10/10/22 112/82  08/30/22 128/82   BMI is There is no height or weight on file to calculate BMI., she has been working on diet and exercise.   She is very interested in weight loss, used Reginal Lutes previously but her insurance no longer covers. Cannot take Phentermine as she is on Adderall for ADD She is down 7 pounds in the past 3 months Wt Readings from Last 3 Encounters:  11/01/22 197 lb 12.8 oz (89.7 kg)  10/10/22 196 lb 12.8 oz (89.3 kg)  08/30/22 200 lb (90.7 kg)   Her ADD is well controlled with Adderall 20 mg BID as needed.  She is having a lot of anxiety- previously tried  Wellbutrin and had bad side effects of headaches, buspar does not help and caused headaches.  She is worried about weight gain. She will get times which she gets overwhelmed and stressed. Would like to see psychiatry   Past Medical History:  Diagnosis Date   ADD (attention deficit disorder)    Anxiety    Gestational diabetes    Hepatic  steatosis 01/14/2020   Hyperlipidemia    S/P cesarean section 12/03/2016     No Known Allergies  Current Outpatient Medications on File Prior to Visit  Medication Sig   amphetamine-dextroamphetamine (ADDERALL) 20 MG tablet Take  1 to 2 tablets  Daily  for ADD   Azelaic Acid 15 % gel After skin is thoroughly washed and patted dry, gently but thoroughly massage a thin film of azelaic acid cream into the affected area twice daily, in the morning and evening.   benzoyl peroxide-erythromycin (BENZAMYCIN) gel Apply to affected area 2 times daily (Patient not taking: Reported on 11/01/2022)   dexamethasone (DECADRON) 4 MG tablet Take 3 tabs for 3 days, 2 tabs for 3 days 1 tab for 5 days. Take with food.   doxycycline (MONODOX) 100 MG capsule Take 1 capsule (100 mg total) by mouth 2 (two) times daily.   fluticasone (FLONASE) 50 MCG/ACT nasal spray Place 2 sprays into both nostrils at bedtime.   ipratropium (ATROVENT) 0.03 % nasal spray Place 2 sprays into the nose 3 (three) times daily.   LO LOESTRIN FE 1 MG-10 MCG / 10 MCG tablet Take 1 tablet by mouth daily.   mupirocin ointment (BACTROBAN) 2 % Apply to affected area 2 times a day   ondansetron (ZOFRAN-ODT) 4 MG disintegrating tablet Dissolve 1 tablet (4 mg total) by mouth every 8 (eight) hours as needed for nausea or vomiting.   No current facility-administered medications on file prior to visit.    ROS: all negative except above.   Physical Exam:  There were no vitals taken for this visit.  General Appearance: Well nourished, in no apparent distress. Eyes: PERRLA, EOMs, conjunctiva no swelling or erythema Sinuses: No Frontal/maxillary tenderness ENT/Mouth: Ext aud canals clear, L TM without erythema, bulging. R TM cerumen impaction - once removed TM no erythema or bulging. No erythema, swelling, or exudate on post pharynx.  Tonsils not swollen or erythematous. Hearing normal.  Neck: Supple, thyroid normal.  Respiratory: Respiratory effort  normal, BS equal bilaterally without rales, rhonchi, wheezing or stridor.  Cardio: RRR with no MRGs. Brisk peripheral pulses without edema.  Abdomen: Soft, + BS.  Non tender, no guarding, rebound, hernias, masses. Lymphatics: Non tender without lymphadenopathy.  Musculoskeletal: Full ROM, 5/5 strength, normal gait.  Skin: Warm, dry, acne noted on face Neuro: Cranial nerves intact. Normal muscle tone, no cerebellar symptoms. Sensation intact.  Psych: Awake and oriented X 3, normal affect, Insight and Judgment appropriate.     Raynelle Dick, NP 12:15 PM [facility]

## 2023-04-11 ENCOUNTER — Ambulatory Visit: Payer: 59 | Admitting: Nurse Practitioner

## 2023-04-11 DIAGNOSIS — Z6831 Body mass index (BMI) 31.0-31.9, adult: Secondary | ICD-10-CM | POA: Diagnosis not present

## 2023-04-11 DIAGNOSIS — F988 Other specified behavioral and emotional disorders with onset usually occurring in childhood and adolescence: Secondary | ICD-10-CM

## 2023-04-11 DIAGNOSIS — E782 Mixed hyperlipidemia: Secondary | ICD-10-CM

## 2023-04-11 DIAGNOSIS — E559 Vitamin D deficiency, unspecified: Secondary | ICD-10-CM

## 2023-04-11 DIAGNOSIS — R7989 Other specified abnormal findings of blood chemistry: Secondary | ICD-10-CM

## 2023-04-11 DIAGNOSIS — E66811 Obesity, class 1: Secondary | ICD-10-CM

## 2023-04-11 DIAGNOSIS — Z01419 Encounter for gynecological examination (general) (routine) without abnormal findings: Secondary | ICD-10-CM | POA: Diagnosis not present

## 2023-04-11 DIAGNOSIS — Z9109 Other allergy status, other than to drugs and biological substances: Secondary | ICD-10-CM

## 2023-04-11 DIAGNOSIS — F419 Anxiety disorder, unspecified: Secondary | ICD-10-CM

## 2023-04-11 DIAGNOSIS — R7309 Other abnormal glucose: Secondary | ICD-10-CM

## 2023-04-11 DIAGNOSIS — Z79899 Other long term (current) drug therapy: Secondary | ICD-10-CM

## 2023-04-11 DIAGNOSIS — N912 Amenorrhea, unspecified: Secondary | ICD-10-CM | POA: Diagnosis not present

## 2023-04-16 NOTE — Progress Notes (Unsigned)
Assessment and Plan:  Tonya [name] was seen today for acute visit.  Diagnoses and all orders for this visit:  Abnormal glucose/History of gestational diabetes Continue focusing on diet, exercise and weight loss   Attention deficit disorder (ADD) without hyperactivity Continue Adderall BID as needed Focus on diet and exercise as well as good sleep hygiene since working nights -     amphetamine-dextroamphetamine (ADDERALL) 20 MG tablet; Take  1 to 2 tablets  Daily  for ADD  Obesity (BMI 30.0-34.9) Fair life protein shakes Eat more frequently - try not to go more than 6 hours without protein Aim for 90 grams of protein a day- 30 breakfast/30 lunch 30 dinner Try to keep net carbs less than 50 Net Carbs=Total Carbs-fiber- sugar alcohols Exercise heartrate 120-140(fat burning zone)- walking 20-30 minutes 4 days a week   Anxiety Did not tolerate Wellbutrin or Buspar Continue diet exercise and practice good sleep hygiene Relaxation techniques Refer to psychology, reluctant to start SSRI due to weight gain  Hyperlipidemia Continue diet and exercise - Lipid panel - CBC -CMP  Allergies Use claritin or Allegra daily, zyrtec makes her sleepy Flonase 2 sprays each nostril daily Monitor symptoms   Elevated LFT's Limit Tylenol and Alcohol Work on diet, exercise and weight loss -CMP  Vit D deficiency Continue Vit D supplementation to maintain value in therapeutic level of 60-100   Medication management -     CBC with Differential/Platelet -     COMPLETE METABOLIC PANEL WITH GFR -     Lipid panel -     Magnesium - TSH  Environmental allergies Take generic claritin or allegra, cannot tolerate zyrtec due to somnolence on med Begin Flonase Monitor symptoms -     fluticasone (FLONASE) 50 MCG/ACT nasal spray; Place 2 sprays into both nostrils at bedtime.    Further disposition pending results of labs. Discussed med's effects and SE's.   Over 30 minutes of exam, counseling, chart  review, and critical decision making was performed.   Future Appointments  Date Time Provider Department Center  04/17/2023 11:00 AM Raynelle Dick, NP [facility] None  07/02/2023 10:00 AM Raynelle Dick, NP [facility] None    ------------------------------------------------------------------------------------------------------------------   HPI There were no vitals taken for this visit.  37 y.o.female presents for 3 months follow up has Anxiety; ADD (attention deficit disorder); Hyperlipidemia; Obesity (BMI 30.0-34.9); Continuous RUQ abdominal pain; Vitamin D deficiency; Previous cesarean section; and S/P cesarean section on their problem list.   She is having a lot of post nasal drip. She is not currently on allergy medications.  She has a very raspy voice currently and had lost voice past weekend.   BP is well controlled without medications.  Denies headaches, chest pain, shortness of breath and dizziness BP Readings from Last 3 Encounters:  11/01/22 (!) 140/82  10/10/22 112/82  08/30/22 128/82   BMI is There is no height or weight on file to calculate BMI., she has been working on diet and exercise.   She is very interested in weight loss, used Reginal Lutes previously but her insurance no longer covers. Cannot take Phentermine as she is on Adderall for ADD She is down 7 pounds in the past 3 months Wt Readings from Last 3 Encounters:  11/01/22 197 lb 12.8 oz (89.7 kg)  10/10/22 196 lb 12.8 oz (89.3 kg)  08/30/22 200 lb (90.7 kg)   Her ADD is well controlled with Adderall 20 mg BID as needed.  She is having a lot of anxiety-  previously tried Wellbutrin and had bad side effects of headaches, buspar does not help and caused headaches.  She is worried about weight gain. She will get times which she gets overwhelmed and stressed. Would like to see psychiatry   Past Medical History:  Diagnosis Date   ADD (attention deficit disorder)    Anxiety    Gestational diabetes    Hepatic  steatosis 01/14/2020   Hyperlipidemia    S/P cesarean section 12/03/2016     No Known Allergies  Current Outpatient Medications on File Prior to Visit  Medication Sig   amphetamine-dextroamphetamine (ADDERALL) 20 MG tablet Take  1 to 2 tablets  Daily  for ADD   Azelaic Acid 15 % gel After skin is thoroughly washed and patted dry, gently but thoroughly massage a thin film of azelaic acid cream into the affected area twice daily, in the morning and evening.   benzoyl peroxide-erythromycin (BENZAMYCIN) gel Apply to affected area 2 times daily (Patient not taking: Reported on 11/01/2022)   dexamethasone (DECADRON) 4 MG tablet Take 3 tabs for 3 days, 2 tabs for 3 days 1 tab for 5 days. Take with food.   doxycycline (MONODOX) 100 MG capsule Take 1 capsule (100 mg total) by mouth 2 (two) times daily.   fluticasone (FLONASE) 50 MCG/ACT nasal spray Place 2 sprays into both nostrils at bedtime.   ipratropium (ATROVENT) 0.03 % nasal spray Place 2 sprays into the nose 3 (three) times daily.   LO LOESTRIN FE 1 MG-10 MCG / 10 MCG tablet Take 1 tablet by mouth daily.   mupirocin ointment (BACTROBAN) 2 % Apply to affected area 2 times a day   ondansetron (ZOFRAN-ODT) 4 MG disintegrating tablet Dissolve 1 tablet (4 mg total) by mouth every 8 (eight) hours as needed for nausea or vomiting.   No current facility-administered medications on file prior to visit.    ROS: all negative except above.   Physical Exam:  There were no vitals taken for this visit.  General Appearance: Well nourished, in no apparent distress. Eyes: PERRLA, EOMs, conjunctiva no swelling or erythema Sinuses: No Frontal/maxillary tenderness ENT/Mouth: Ext aud canals clear, L TM without erythema, bulging. R TM cerumen impaction - once removed TM no erythema or bulging. No erythema, swelling, or exudate on post pharynx.  Tonsils not swollen or erythematous. Hearing normal.  Neck: Supple, thyroid normal.  Respiratory: Respiratory effort  normal, BS equal bilaterally without rales, rhonchi, wheezing or stridor.  Cardio: RRR with no MRGs. Brisk peripheral pulses without edema.  Abdomen: Soft, + BS.  Non tender, no guarding, rebound, hernias, masses. Lymphatics: Non tender without lymphadenopathy.  Musculoskeletal: Full ROM, 5/5 strength, normal gait.  Skin: Warm, dry, acne noted on face Neuro: Cranial nerves intact. Normal muscle tone, no cerebellar symptoms. Sensation intact.  Psych: Awake and oriented X 3, normal affect, Insight and Judgment appropriate.     Raynelle Dick, NP 12:13 PM [facility]

## 2023-04-17 ENCOUNTER — Ambulatory Visit (INDEPENDENT_AMBULATORY_CARE_PROVIDER_SITE_OTHER): Payer: 59 | Admitting: Nurse Practitioner

## 2023-04-17 ENCOUNTER — Encounter: Payer: Self-pay | Admitting: Nurse Practitioner

## 2023-04-17 VITALS — BP 128/68 | HR 78 | Temp 97.7°F | Ht 65.0 in | Wt 188.2 lb

## 2023-04-17 DIAGNOSIS — R0683 Snoring: Secondary | ICD-10-CM | POA: Diagnosis not present

## 2023-04-17 DIAGNOSIS — R7989 Other specified abnormal findings of blood chemistry: Secondary | ICD-10-CM

## 2023-04-17 DIAGNOSIS — E559 Vitamin D deficiency, unspecified: Secondary | ICD-10-CM | POA: Diagnosis not present

## 2023-04-17 DIAGNOSIS — F988 Other specified behavioral and emotional disorders with onset usually occurring in childhood and adolescence: Secondary | ICD-10-CM

## 2023-04-17 DIAGNOSIS — E66811 Obesity, class 1: Secondary | ICD-10-CM | POA: Diagnosis not present

## 2023-04-17 DIAGNOSIS — R400 Somnolence: Secondary | ICD-10-CM

## 2023-04-17 DIAGNOSIS — Z79899 Other long term (current) drug therapy: Secondary | ICD-10-CM

## 2023-04-17 DIAGNOSIS — E782 Mixed hyperlipidemia: Secondary | ICD-10-CM | POA: Diagnosis not present

## 2023-04-17 DIAGNOSIS — Z9109 Other allergy status, other than to drugs and biological substances: Secondary | ICD-10-CM

## 2023-04-17 DIAGNOSIS — F419 Anxiety disorder, unspecified: Secondary | ICD-10-CM

## 2023-04-17 DIAGNOSIS — D539 Nutritional anemia, unspecified: Secondary | ICD-10-CM

## 2023-04-17 DIAGNOSIS — R7309 Other abnormal glucose: Secondary | ICD-10-CM

## 2023-04-17 NOTE — Patient Instructions (Signed)
Fatigue If you have fatigue, you feel tired all the time and have a lack of energy or a lack of motivation. Fatigue may make it difficult to start or complete tasks because of exhaustion. Occasional or mild fatigue is often a normal response to activity or life. However, long-term (chronic) or extreme fatigue may be a symptom of a medical condition such as: Depression. Not having enough red blood cells or hemoglobin in the blood (anemia). A problem with a small gland located in the lower front part of the neck (thyroid disorder). Rheumatologic conditions. These are problems related to the body's defense system (immune system). Infections, especially certain viral infections. Fatigue can also lead to negative health outcomes over time. Follow these instructions at home: Medicines Take over-the-counter and prescription medicines only as told by your health care provider. Take a multivitamin if told by your health care provider. Do not use herbal or dietary supplements unless they are approved by your health care provider. Eating and drinking  Avoid heavy meals in the evening. Eat a well-balanced diet, which includes lean proteins, whole grains, plenty of fruits and vegetables, and low-fat dairy products. Avoid eating or drinking too many products with caffeine in them. Avoid alcohol. Drink enough fluid to keep your urine pale yellow. Activity  Exercise regularly, as told by your health care provider. Use or practice techniques to help you relax, such as yoga, tai chi, meditation, or massage therapy. Lifestyle Change situations that cause you stress. Try to keep your work and personal schedules in balance. Do not use recreational or illegal drugs. General instructions Monitor your fatigue for any changes. Go to bed and get up at the same time every day. Avoid fatigue by pacing yourself during the day and getting enough sleep at night. Maintain a healthy weight. Contact a health care  provider if: Your fatigue does not get better. You have a fever. You suddenly lose or gain weight. You have headaches. You have trouble falling asleep or sleeping through the night. You feel angry, guilty, anxious, or sad. You have swelling in your legs or another part of your body. Get help right away if: You feel confused, feel like you might faint, or faint. Your vision is blurry or you have a severe headache. You have severe pain in your abdomen, your back, or the area between your waist and hips (pelvis). You have chest pain, shortness of breath, or an irregular or fast heartbeat. You are unable to urinate, or you urinate less than normal. You have abnormal bleeding from the rectum, nose, lungs, nipples, or, if you are female, the vagina. You vomit blood. You have thoughts about hurting yourself or others. These symptoms may be an emergency. Get help right away. Call 911. Do not wait to see if the symptoms will go away. Do not drive yourself to the hospital. Get help right away if you feel like you may hurt yourself or others, or have thoughts about taking your own life. Go to your nearest emergency room or: Call 911. Call the National Suicide Prevention Lifeline at 1-800-273-8255 or 988. This is open 24 hours a day. Text the Crisis Text Line at 741741. Summary If you have fatigue, you feel tired all the time and have a lack of energy or a lack of motivation. Fatigue may make it difficult to start or complete tasks because of exhaustion. Long-term (chronic) or extreme fatigue may be a symptom of a medical condition. Exercise regularly, as told by your health care provider.   Change situations that cause you stress. Try to keep your work and personal schedules in balance. This information is not intended to replace advice given to you by your health care provider. Make sure you discuss any questions you have with your health care provider. Document Revised: 04/04/2021 Document  Reviewed: 04/04/2021 Elsevier Patient Education  2024 Elsevier Inc.  

## 2023-04-18 ENCOUNTER — Encounter: Payer: Self-pay | Admitting: Nurse Practitioner

## 2023-04-18 ENCOUNTER — Other Ambulatory Visit: Payer: Self-pay | Admitting: Nurse Practitioner

## 2023-04-18 ENCOUNTER — Other Ambulatory Visit (HOSPITAL_COMMUNITY): Payer: Self-pay

## 2023-04-18 DIAGNOSIS — E782 Mixed hyperlipidemia: Secondary | ICD-10-CM

## 2023-04-18 LAB — TSH: TSH: 0.85 m[IU]/L

## 2023-04-18 LAB — COMPLETE METABOLIC PANEL WITH GFR
AG Ratio: 2 (calc) (ref 1.0–2.5)
ALT: 28 U/L (ref 6–29)
AST: 16 U/L (ref 10–30)
Albumin: 4.5 g/dL (ref 3.6–5.1)
Alkaline phosphatase (APISO): 48 U/L (ref 31–125)
BUN: 14 mg/dL (ref 7–25)
CO2: 25 mmol/L (ref 20–32)
Calcium: 9.6 mg/dL (ref 8.6–10.2)
Chloride: 106 mmol/L (ref 98–110)
Creat: 0.72 mg/dL (ref 0.50–0.97)
Globulin: 2.3 g/dL (ref 1.9–3.7)
Glucose, Bld: 87 mg/dL (ref 65–99)
Potassium: 4.5 mmol/L (ref 3.5–5.3)
Sodium: 140 mmol/L (ref 135–146)
Total Bilirubin: 0.7 mg/dL (ref 0.2–1.2)
Total Protein: 6.8 g/dL (ref 6.1–8.1)
eGFR: 111 mL/min/{1.73_m2} (ref >=60)

## 2023-04-18 LAB — VITAMIN B12: Vitamin B-12: 457 pg/mL (ref 200–1100)

## 2023-04-18 LAB — CBC WITH DIFFERENTIAL/PLATELET
Absolute Lymphocytes: 1838 {cells}/uL (ref 850–3900)
Absolute Monocytes: 240 {cells}/uL (ref 200–950)
Basophils Absolute: 38 {cells}/uL (ref 0–200)
Basophils Relative: 0.8 %
Eosinophils Absolute: 192 {cells}/uL (ref 15–500)
Eosinophils Relative: 4 %
HCT: 44.9 % (ref 35.0–45.0)
Hemoglobin: 14.6 g/dL (ref 11.7–15.5)
MCH: 28.9 pg (ref 27.0–33.0)
MCHC: 32.5 g/dL (ref 32.0–36.0)
MCV: 88.9 fL (ref 80.0–100.0)
MPV: 10.2 fL (ref 7.5–12.5)
Monocytes Relative: 5 %
Neutro Abs: 2491 {cells}/uL (ref 1500–7800)
Neutrophils Relative %: 51.9 %
Platelets: 205 10*3/uL (ref 140–400)
RBC: 5.05 10*6/uL (ref 3.80–5.10)
RDW: 12.5 % (ref 11.0–15.0)
Total Lymphocyte: 38.3 %
WBC: 4.8 10*3/uL (ref 3.8–10.8)

## 2023-04-18 LAB — MAGNESIUM: Magnesium: 2.1 mg/dL (ref 1.5–2.5)

## 2023-04-18 LAB — IRON,TIBC AND FERRITIN PANEL
%SAT: 32 % (ref 16–45)
Ferritin: 55 ng/mL (ref 16–154)
Iron: 117 ug/dL (ref 40–190)
TIBC: 363 ug/dL (ref 250–450)

## 2023-04-18 LAB — LIPID PANEL
Cholesterol: 199 mg/dL (ref <200)
HDL: 45 mg/dL — ABNORMAL LOW (ref >=50)
LDL Cholesterol (Calc): 128 mg/dL — ABNORMAL HIGH
Non-HDL Cholesterol (Calc): 154 mg/dL — ABNORMAL HIGH (ref <130)
Total CHOL/HDL Ratio: 4.4 (calc) (ref <5.0)
Triglycerides: 144 mg/dL (ref <150)

## 2023-04-18 MED ORDER — ROSUVASTATIN CALCIUM 5 MG PO TABS
ORAL_TABLET | ORAL | 3 refills | Status: DC
  Filled 2023-04-18 – 2023-05-03 (×2): qty 36, 90d supply, fill #0

## 2023-04-30 ENCOUNTER — Other Ambulatory Visit: Payer: Self-pay | Admitting: Nurse Practitioner

## 2023-04-30 ENCOUNTER — Other Ambulatory Visit (HOSPITAL_COMMUNITY): Payer: Self-pay

## 2023-04-30 DIAGNOSIS — F988 Other specified behavioral and emotional disorders with onset usually occurring in childhood and adolescence: Secondary | ICD-10-CM

## 2023-04-30 MED ORDER — AMPHETAMINE-DEXTROAMPHETAMINE 20 MG PO TABS: ORAL_TABLET | ORAL | 0 refills | Status: DC

## 2023-05-03 ENCOUNTER — Other Ambulatory Visit (HOSPITAL_COMMUNITY): Payer: Self-pay

## 2023-05-03 ENCOUNTER — Encounter (HOSPITAL_COMMUNITY): Payer: Self-pay

## 2023-05-07 ENCOUNTER — Other Ambulatory Visit (HOSPITAL_COMMUNITY): Payer: Self-pay

## 2023-05-30 ENCOUNTER — Other Ambulatory Visit: Payer: Self-pay | Admitting: Nurse Practitioner

## 2023-05-30 DIAGNOSIS — F988 Other specified behavioral and emotional disorders with onset usually occurring in childhood and adolescence: Secondary | ICD-10-CM

## 2023-05-30 MED ORDER — AMPHETAMINE-DEXTROAMPHETAMINE 20 MG PO TABS: ORAL_TABLET | ORAL | 0 refills | Status: DC

## 2023-06-09 ENCOUNTER — Telehealth: Payer: 59 | Admitting: Physician Assistant

## 2023-06-09 DIAGNOSIS — J329 Chronic sinusitis, unspecified: Secondary | ICD-10-CM | POA: Diagnosis not present

## 2023-06-09 MED ORDER — FLUTICASONE PROPIONATE 50 MCG/ACT NA SUSP: 2.0000 | Freq: Every day | NASAL | 6 refills | Status: AC

## 2023-06-09 MED ORDER — AMOXICILLIN-POT CLAVULANATE 875-125 MG PO TABS: 1.0000 | ORAL_TABLET | Freq: Two times a day (BID) | ORAL | 0 refills | Status: AC

## 2023-06-09 NOTE — Progress Notes (Signed)
I have spent 5 minutes in review of e-visit questionnaire, review and updating patient chart, medical decision making and response to patient.   Laure Kidney, PA-C

## 2023-06-09 NOTE — Progress Notes (Signed)

## 2023-06-29 ENCOUNTER — Other Ambulatory Visit: Payer: Self-pay | Admitting: Nurse Practitioner

## 2023-06-29 ENCOUNTER — Telehealth: Payer: Self-pay | Admitting: Nurse Practitioner

## 2023-06-29 DIAGNOSIS — F988 Other specified behavioral and emotional disorders with onset usually occurring in childhood and adolescence: Secondary | ICD-10-CM

## 2023-06-29 MED ORDER — AMPHETAMINE-DEXTROAMPHETAMINE 20 MG PO TABS: ORAL_TABLET | ORAL | 0 refills | Status: DC

## 2023-06-29 NOTE — Telephone Encounter (Signed)
 New pharmacy--[id number] - [place], [place] - [address]

## 2023-06-29 NOTE — Progress Notes (Signed)
 [facility] was not taking her [facility] card for Adderall, request med sent to [facility]. PDMP is reviewed and appropriate

## 2023-06-29 NOTE — Telephone Encounter (Signed)
 You refilled patient's adderall to [facility] on file. She went to go pick it up and they told her they are not using [facility] anymore. IF you could cancel that order and send the refill to a new pharmacy... [address]

## 2023-07-02 ENCOUNTER — Encounter: Payer: 59 | Admitting: Nurse Practitioner

## 2023-07-25 ENCOUNTER — Encounter: Payer: 59 | Admitting: Nurse Practitioner

## 2023-07-31 ENCOUNTER — Other Ambulatory Visit: Payer: Self-pay | Admitting: Nurse Practitioner

## 2023-07-31 DIAGNOSIS — F988 Other specified behavioral and emotional disorders with onset usually occurring in childhood and adolescence: Secondary | ICD-10-CM

## 2023-07-31 MED ORDER — AMPHETAMINE-DEXTROAMPHETAMINE 20 MG PO TABS: ORAL_TABLET | ORAL | 0 refills | Status: DC

## 2023-08-13 ENCOUNTER — Telehealth: Payer: Self-pay

## 2023-08-13 NOTE — Telephone Encounter (Signed)
 Copied from CRM [id number]. Topic: Appointments - Scheduling Inquiry for Clinic >> Aug 13, 2023  1:36 PM Chantha C wrote: Reason for CRM: Patient states she was approved to see Dr. Abbe Amsterdam, her pcp Dr. Lucky Cowboy passed away and needs a new pcp. Informed patient, we do not see notation on this matter will send provider's nurse for consult. Please advise [phone].

## 2023-08-16 NOTE — Telephone Encounter (Signed)
 Called patient to get scheduled and had to LVM for them to call us back to be put on schedule with Dr Patsy Lager

## 2023-08-25 NOTE — Progress Notes (Unsigned)
  Healthcare at [facility] [address] [place], [place] [address] [phone]  Date:  08/30/2023   Name:  Tonya [name]   DOB:  [birth year]   MRN:  [id number]  PCP:  Lucky Cowboy, MD    Chief Complaint: No chief complaint on file.   History of Present Illness:  Tonya [name] is a 38 y.o. very pleasant female patient who presents with the following:  Seen today as a new patient-her previous primary care provider Dr. Oneta Rack recently passed away  She has a history of ADD, anxiety, obesity, hyperlipidemia, elevated LFTs, vitamin D deficiency Lab work on chart from October Patient Active Problem List   Diagnosis Date Noted   Previous cesarean section 12/29/2021   S/P cesarean section 12/29/2021   Vitamin D deficiency 03/08/2020   Continuous RUQ abdominal pain 01/14/2020   Obesity (BMI 30.0-34.9) 01/02/2018   Hyperlipidemia    Anxiety    ADD (attention deficit disorder)     Past Medical History:  Diagnosis Date   ADD (attention deficit disorder)    Anxiety    Gestational diabetes    Hepatic steatosis 01/14/2020   Hyperlipidemia    S/P cesarean section 12/03/2016    Past Surgical History:  Procedure Laterality Date   CESAREAN SECTION N/A 12/03/2016   Procedure: CESAREAN SECTION;  Surgeon: Marcelle Overlie, MD;  Location: [facility];  Service: Obstetrics;  Laterality: N/A;   CESAREAN SECTION N/A 12/29/2021   Procedure: CESAREAN SECTION RPT X1;  Surgeon: Mitchel Honour, DO;  Location: [facility] LD ORS;  Service: Obstetrics;  Laterality: N/A;   WISDOM TOOTH EXTRACTION      Social History   Tobacco Use   Smoking status: Never   Smokeless tobacco: Never  Vaping Use   Vaping status: Never Used  Substance Use Topics   Alcohol use: Yes    Alcohol/week: 3.0 standard drinks of alcohol    Types: 3 Glasses of wine per week   Drug use: No    Family History  Problem Relation Age of Onset    Hypertension Mother    Depression Mother    Hypertension Father    Prostate cancer Father 67       prostate   Hyperlipidemia Brother    Ovarian cancer Paternal Grandmother        ? GYN cancer   Lupus Paternal Grandmother    Eczema Son    Lupus Paternal Aunt     No Known Allergies  Medication list has been reviewed and updated.  Current Outpatient Medications on File Prior to Visit  Medication Sig Dispense Refill   amphetamine-dextroamphetamine (ADDERALL) 20 MG tablet Take  1 to 2 tablets  Daily  for ADD 60 tablet 0   Azelaic Acid 15 % gel After skin is thoroughly washed and patted dry, gently but thoroughly massage a thin film of azelaic acid cream into the affected area twice daily, in the morning and evening. 50 g 0   Cholecalciferol (VITAMIN D3) 50 MCG (2000 UT) capsule      fluticasone (FLONASE) 50 MCG/ACT nasal spray Place 2 sprays into both nostrils daily. 16 g 6   ipratropium (ATROVENT) 0.03 % nasal spray Place 2 sprays into the nose 3 (three) times daily. 30 mL 2   LO LOESTRIN FE 1 MG-10 MCG / 10 MCG tablet Take 1 tablet by mouth daily.     Multiple Vitamin (MULTIVITAMIN) tablet Take 1 tablet by mouth daily.  mupirocin ointment (BACTROBAN) 2 % Apply to affected area 2 times a day 22 g 0   ondansetron (ZOFRAN-ODT) 4 MG disintegrating tablet Dissolve 1 tablet (4 mg total) by mouth every 8 (eight) hours as needed for nausea or vomiting. 20 tablet 0   rosuvastatin (CRESTOR) 5 MG tablet Take 1 tablet by mouth 3 times per week 36 tablet 3   No current facility-administered medications on file prior to visit.    Review of Systems:  As per HPI- otherwise negative.    Physical Examination: There were no vitals filed for this visit. There were no vitals filed for this visit. There is no height or weight on file to calculate BMI. Ideal Body Weight:    GEN: no acute distress. HEENT: Atraumatic, Normocephalic.  Ears and Nose: No external deformity. CV: RRR, No M/G/R. No  JVD. No thrill. No extra heart sounds. PULM: CTA B, no wheezes, crackles, rhonchi. No retractions. No resp. distress. No accessory muscle use. ABD: S, NT, ND, +BS. No rebound. No HSM. EXTR: No c/c/e PSYCH: Normally interactive. Conversant.    Assessment and Plan: ***  Signed Abbe Amsterdam, MD

## 2023-08-29 ENCOUNTER — Other Ambulatory Visit: Payer: Self-pay | Admitting: Nurse Practitioner

## 2023-08-29 DIAGNOSIS — F988 Other specified behavioral and emotional disorders with onset usually occurring in childhood and adolescence: Secondary | ICD-10-CM

## 2023-08-29 MED ORDER — AMPHETAMINE-DEXTROAMPHETAMINE 20 MG PO TABS: ORAL_TABLET | ORAL | 0 refills | Status: DC

## 2023-08-30 ENCOUNTER — Ambulatory Visit: Payer: 59 | Admitting: Family Medicine

## 2023-08-30 VITALS — BP 132/80 | HR 83 | Temp 98.4°F | Resp 18 | Ht 65.0 in | Wt 194.4 lb

## 2023-08-30 DIAGNOSIS — F411 Generalized anxiety disorder: Secondary | ICD-10-CM | POA: Diagnosis not present

## 2023-08-30 DIAGNOSIS — R41840 Attention and concentration deficit: Secondary | ICD-10-CM

## 2023-08-30 DIAGNOSIS — E782 Mixed hyperlipidemia: Secondary | ICD-10-CM | POA: Diagnosis not present

## 2023-08-30 MED ORDER — VENLAFAXINE HCL ER 37.5 MG PO CP24: 37.5000 mg | ORAL_CAPSULE | Freq: Every day | ORAL | 3 refills | Status: DC

## 2023-08-30 MED ORDER — ROSUVASTATIN CALCIUM 5 MG PO TABS: ORAL_TABLET | ORAL | 3 refills | Status: DC

## 2023-08-30 NOTE — Patient Instructions (Addendum)
 Please shoot me a mychart message when you need more adderall  We will try effexor xr for anxiety and OCD symptoms- let me know how it seems to work for you - we can go up on the dose if needed in a couple of weeks!  Please see me in a few months for your physical and take care

## 2023-09-28 ENCOUNTER — Other Ambulatory Visit (HOSPITAL_COMMUNITY): Payer: Self-pay

## 2023-09-28 ENCOUNTER — Encounter: Payer: Self-pay | Admitting: Family Medicine

## 2023-09-28 DIAGNOSIS — F411 Generalized anxiety disorder: Secondary | ICD-10-CM

## 2023-09-28 DIAGNOSIS — F988 Other specified behavioral and emotional disorders with onset usually occurring in childhood and adolescence: Secondary | ICD-10-CM

## 2023-09-28 MED ORDER — AMPHETAMINE-DEXTROAMPHETAMINE 20 MG PO TABS
20.0000 mg | ORAL_TABLET | Freq: Every day | ORAL | 0 refills | Status: DC
  Filled 2023-09-28: qty 60, 30d supply, fill #0

## 2023-09-28 MED ORDER — AMPHETAMINE-DEXTROAMPHETAMINE 20 MG PO TABS
20.0000 mg | ORAL_TABLET | Freq: Every day | ORAL | 0 refills | Status: DC
  Filled 2023-09-28: qty 60, fill #0
  Filled 2023-10-26: qty 60, 30d supply, fill #0

## 2023-09-28 MED ORDER — VENLAFAXINE HCL ER 37.5 MG PO CP24
37.5000 mg | ORAL_CAPSULE | Freq: Every day | ORAL | 3 refills | Status: DC
  Filled 2023-09-28: qty 90, 90d supply, fill #0

## 2023-10-17 ENCOUNTER — Telehealth: Admitting: Physician Assistant

## 2023-10-17 DIAGNOSIS — J208 Acute bronchitis due to other specified organisms: Secondary | ICD-10-CM | POA: Diagnosis not present

## 2023-10-17 DIAGNOSIS — B9689 Other specified bacterial agents as the cause of diseases classified elsewhere: Secondary | ICD-10-CM | POA: Diagnosis not present

## 2023-10-17 MED ORDER — AZITHROMYCIN 250 MG PO TABS: ORAL_TABLET | ORAL | 0 refills | Status: AC

## 2023-10-17 MED ORDER — ALBUTEROL SULFATE HFA 108 (90 BASE) MCG/ACT IN AERS: 1.0000 | INHALATION_SPRAY | Freq: Four times a day (QID) | RESPIRATORY_TRACT | 0 refills | Status: AC | PRN

## 2023-10-17 MED ORDER — PSEUDOEPH-BROMPHEN-DM 30-2-10 MG/5ML PO SYRP: 5.0000 mL | ORAL_SOLUTION | Freq: Four times a day (QID) | ORAL | 0 refills | Status: AC | PRN

## 2023-10-17 NOTE — Progress Notes (Signed)
 E-Visit for Cough   We are sorry that you are not feeling well.  Here is how we plan to help!  Based on your presentation I believe you most likely have A cough due to bacteria.  When patients have a fever and a productive cough with a change in color or increased sputum production, we are concerned about bacterial bronchitis.  If left untreated it can progress to pneumonia.  If your symptoms do not improve with your treatment plan it is important that you contact your provider.   I have prescribed Azithromyin 250 mg: two tablets now and then one tablet daily for 4 additonal days    In addition you may use Bromfed DM cough syrup Take 5mL every 6 hours as needed for cough, prescribed  I have also prescribed: Albuterol  inhaler Use 1-2 puffs every 6 hours as needed for shortness of breath, chest tightness, and/or wheezing.  From your responses in the eVisit questionnaire you describe inflammation in the upper respiratory tract which is causing a significant cough.  This is commonly called Bronchitis and has four common causes:   Allergies Viral Infections Acid Reflux Bacterial Infection Allergies, viruses and acid reflux are treated by controlling symptoms or eliminating the cause. An example might be a cough caused by taking certain blood pressure medications. You stop the cough by changing the medication. Another example might be a cough caused by acid reflux. Controlling the reflux helps control the cough.  USE OF BRONCHODILATOR ("RESCUE") INHALERS: There is a risk from using your bronchodilator too frequently.  The risk is that over-reliance on a medication which only relaxes the muscles surrounding the breathing tubes can reduce the effectiveness of medications prescribed to reduce swelling and congestion of the tubes themselves.  Although you feel brief relief from the bronchodilator inhaler, your asthma may actually be worsening with the tubes becoming more swollen and filled with mucus.  This  can delay other crucial treatments, such as oral steroid medications. If you need to use a bronchodilator inhaler daily, several times per day, you should discuss this with your provider.  There are probably better treatments that could be used to keep your asthma under control.     HOME CARE Only take medications as instructed by your medical team. Complete the entire course of an antibiotic. Drink plenty of fluids and get plenty of rest. Avoid close contacts especially the very young and the elderly Cover your mouth if you cough or cough into your sleeve. Always remember to wash your hands A steam or ultrasonic humidifier can help congestion.   GET HELP RIGHT AWAY IF: You develop worsening fever. You become short of breath You cough up blood. Your symptoms persist after you have completed your treatment plan MAKE SURE YOU  Understand these instructions. Will watch your condition. Will get help right away if you are not doing well or get worse.    Thank you for choosing an e-visit.  Your e-visit answers were reviewed by a board certified advanced clinical practitioner to complete your personal care plan. Depending upon the condition, your plan could have included both over the counter or prescription medications.  Please review your pharmacy choice. Make sure the pharmacy is open so you can pick up prescription now. If there is a problem, you may contact your provider through [employer] and have the prescription routed to another pharmacy.  Your safety is important to us . If you have drug allergies check your prescription carefully.   For the  next 24 hours you can use MyChart to ask questions about today's visit, request a non-urgent call back, or ask for a work or school excuse. You will get an email in the next two days asking about your experience. I hope that your e-visit has been valuable and will speed your recovery.    I have spent 5 minutes in review of e-visit  questionnaire, review and updating patient chart, medical decision making and response to patient.   Angelia Kelp, PA-C

## 2023-10-26 ENCOUNTER — Other Ambulatory Visit (HOSPITAL_COMMUNITY): Payer: Self-pay

## 2023-11-26 ENCOUNTER — Other Ambulatory Visit: Payer: Self-pay | Admitting: Family Medicine

## 2023-11-26 ENCOUNTER — Other Ambulatory Visit (HOSPITAL_COMMUNITY): Payer: Self-pay

## 2023-11-26 DIAGNOSIS — F988 Other specified behavioral and emotional disorders with onset usually occurring in childhood and adolescence: Secondary | ICD-10-CM

## 2023-11-26 MED ORDER — AMPHETAMINE-DEXTROAMPHETAMINE 20 MG PO TABS
20.0000 mg | ORAL_TABLET | Freq: Every day | ORAL | 0 refills | Status: DC
  Filled 2023-11-26: qty 60, 30d supply, fill #0

## 2023-11-26 MED ORDER — AMPHETAMINE-DEXTROAMPHETAMINE 20 MG PO TABS
20.0000 mg | ORAL_TABLET | Freq: Every day | ORAL | 0 refills | Status: DC
  Filled 2023-11-26 – 2023-12-26 (×2): qty 60, 30d supply, fill #0

## 2023-11-29 ENCOUNTER — Encounter: Payer: Self-pay | Admitting: Family Medicine

## 2023-12-02 NOTE — Patient Instructions (Signed)
 It was great to see you again today I will be in touch with your labs   Please try the Fioricet for your headaches  Let's have you take the venlafaxine  XR 37.5 daily for total of 7 days.  Following that, please take 12.5mg / half of the 25 mg immediate release twice daily for 7 days, then once daily for 7 days, then every other day for about 10 days  However, if your headaches fail to go away with this procedure please let me know!

## 2023-12-02 NOTE — Progress Notes (Unsigned)
 [facility] [address], [address] [place], [place] [address] [phone]  Date:  12/05/2023   Name:  [place] [name]   DOB:  1986/03/18   MRN:  [id number]  PCP:  Kaylee Partridge, MD    Chief Complaint: No chief complaint on file.   History of Present Illness:  [place] [name] is a 38 y.o. very pleasant female patient who presents with the following:  Pt seen today for a CPE Last visit with myself was in March of this year-at that time we established care, her previous primary care physician who passed away She has a history of ADD, anxiety, obesity, hyperlipidemia, elevated LFTs/fatty liver, vitamin D  deficiency   She works as an Therapist, sports on oncology at [employer] Married with 2 young children  She has gynecology care per Dr. Cipriano Creeks  We discussed symptoms of anxiety and OCD and previous treatment at our last visit: Patient notes some history of anxiety and OCD type symptoms.  She did use Prozac in the past but it caused weight gain.  She also tried Wellbutrin  as well as BuSpar but did not tolerate them well.  We discussed trying an SNRI and she would be up for this idea.  Will prescribe venlafaxine  No SI -depression is not a major feature  Unfortunately this was not working very well for her.  She sent me a message via MyChart last week letting me know if she had weaned herself off Effexor  a little over a month previously.  She was concerned about headaches that have been persistent since she stopped using Effexor -   Oral contraceptive pill Rosuvastatin   Blood work on chart from [employer] (at that time LFTs were normal), lipids, iron studies, CBC, TSH Pap screening is up-to-date Lab Results  Component Value Date   HGBA1C 5.5 06/29/2022     Patient Active Problem List   Diagnosis Date Noted   Previous cesarean section 12/29/2021   S/P cesarean section 12/29/2021   Vitamin D   deficiency 03/08/2020   Continuous RUQ abdominal pain 01/14/2020   Obesity (BMI 30.0-34.9) 01/02/2018   Hyperlipidemia    Anxiety    ADD (attention deficit disorder)     Past Medical History:  Diagnosis Date   ADD (attention deficit disorder)    Anxiety    Gestational diabetes    Hepatic steatosis 01/14/2020   Hyperlipidemia    S/P cesarean section 12/03/2016    Past Surgical History:  Procedure Laterality Date   CESAREAN SECTION N/A 12/03/2016   Procedure: CESAREAN SECTION;  Surgeon: Thurman Flores, MD;  Location: [facility] BIRTHING SUITES;  Service: Obstetrics;  Laterality: N/A;   CESAREAN SECTION N/A 12/29/2021   Procedure: CESAREAN SECTION RPT X1;  Surgeon: Dyanna Glasgow, DO;  Location: [facility] LD ORS;  Service: Obstetrics;  Laterality: N/A;   WISDOM TOOTH EXTRACTION      Social History   Tobacco Use   Smoking status: Never   Smokeless tobacco: Never  Vaping Use   Vaping status: Never Used  Substance Use Topics   Alcohol use: Yes    Alcohol/week: 3.0 standard drinks of alcohol    Types: 3 Glasses of wine per week   Drug use: No    Family History  Problem Relation Age of Onset   Hypertension Mother    Depression Mother    Hypertension Father    Prostate cancer Father 66       prostate   Hyperlipidemia  Brother    Ovarian cancer Paternal Grandmother        ? GYN cancer   Lupus Paternal Grandmother    Eczema Son    Lupus Paternal Aunt     No Known Allergies  Medication list has been reviewed and updated.  Current Outpatient Medications on File Prior to Visit  Medication Sig Dispense Refill   albuterol  (VENTOLIN  HFA) 108 (90 Base) MCG/ACT inhaler Inhale 1-2 puffs into the lungs every 6 (six) hours as needed. 8 g 0   amphetamine -dextroamphetamine  (ADDERALL) 20 MG tablet Take 1-2 tablets (20-40 mg total) by mouth daily for attention deficit disorder 60 tablet 0   amphetamine -dextroamphetamine  (ADDERALL) 20 MG tablet Take 1-2 tablets (20-40 mg total) by mouth daily for  add 60 tablet 0   Azelaic Acid  15 % gel After skin is thoroughly washed and patted dry, gently but thoroughly massage a thin film of azelaic acid  cream into the affected area twice daily, in the morning and evening. 50 g 0   brompheniramine-pseudoephedrine-DM 30-2-10 MG/5ML syrup Take 5 mLs by mouth 4 (four) times daily as needed. 120 mL 0   Cholecalciferol  (VITAMIN D3) 50 MCG (2000 UT) capsule      fluticasone  (FLONASE ) 50 MCG/ACT nasal spray Place 2 sprays into both nostrils daily. 16 g 6   ipratropium (ATROVENT ) 0.03 % nasal spray Place 2 sprays into the nose 3 (three) times daily. 30 mL 2   LO LOESTRIN FE 1 MG-10 MCG / 10 MCG tablet Take 1 tablet by mouth daily.     Multiple Vitamin (MULTIVITAMIN) tablet Take 1 tablet by mouth daily.     mupirocin  ointment (BACTROBAN ) 2 % Apply to affected area 2 times a day 22 g 0   ondansetron  (ZOFRAN -ODT) 4 MG disintegrating tablet Dissolve 1 tablet (4 mg total) by mouth every 8 (eight) hours as needed for nausea or vomiting. 20 tablet 0   rosuvastatin  (CRESTOR ) 5 MG tablet Take 1 tablet by mouth 3 times per week 90 tablet 3   venlafaxine  XR (EFFEXOR  XR) 37.5 MG 24 hr capsule Take 1 capsule (37.5 mg total) by mouth daily with breakfast. 90 capsule 3   No current facility-administered medications on file prior to visit.    Review of Systems:  As per HPI- otherwise negative.  Physical Examination: There were no vitals filed for this visit. There were no vitals filed for this visit. There is no height or weight on file to calculate BMI. Ideal Body Weight:    GEN: no acute distress. HEENT: Atraumatic, Normocephalic.  Ears and Nose: No external deformity. CV: RRR, No M/G/R. No JVD. No thrill. No extra heart sounds. PULM: CTA B, no wheezes, crackles, rhonchi. No retractions. No resp. distress. No accessory muscle use. ABD: S, NT, ND, +BS. No rebound. No HSM. EXTR: No c/c/e PSYCH: Normally interactive. Conversant.    Assessment and  Plan: ***  Signed Gates Kasal, MD

## 2023-12-05 ENCOUNTER — Other Ambulatory Visit (HOSPITAL_COMMUNITY): Payer: Self-pay

## 2023-12-05 ENCOUNTER — Ambulatory Visit (INDEPENDENT_AMBULATORY_CARE_PROVIDER_SITE_OTHER): Admitting: Family Medicine

## 2023-12-05 ENCOUNTER — Other Ambulatory Visit: Payer: Self-pay

## 2023-12-05 ENCOUNTER — Encounter: Payer: Self-pay | Admitting: Family Medicine

## 2023-12-05 VITALS — BP 128/90 | HR 85 | Ht 65.0 in | Wt 190.4 lb

## 2023-12-05 DIAGNOSIS — Z1329 Encounter for screening for other suspected endocrine disorder: Secondary | ICD-10-CM | POA: Diagnosis not present

## 2023-12-05 DIAGNOSIS — Z131 Encounter for screening for diabetes mellitus: Secondary | ICD-10-CM

## 2023-12-05 DIAGNOSIS — R519 Headache, unspecified: Secondary | ICD-10-CM

## 2023-12-05 DIAGNOSIS — E782 Mixed hyperlipidemia: Secondary | ICD-10-CM | POA: Diagnosis not present

## 2023-12-05 DIAGNOSIS — Z13220 Encounter for screening for lipoid disorders: Secondary | ICD-10-CM | POA: Diagnosis not present

## 2023-12-05 DIAGNOSIS — Z130 Encounter for screening for diseases of the blood and blood-forming organs and certain disorders involving the immune mechanism: Secondary | ICD-10-CM

## 2023-12-05 DIAGNOSIS — Z0000 Encounter for general adult medical examination without abnormal findings: Secondary | ICD-10-CM

## 2023-12-05 MED ORDER — BUTALBITAL-APAP-CAFFEINE 50-325-40 MG PO TABS
1.0000 | ORAL_TABLET | Freq: Four times a day (QID) | ORAL | 1 refills | Status: AC | PRN
  Filled 2023-12-05: qty 20, 3d supply, fill #0
  Filled 2024-02-27: qty 20, 3d supply, fill #1

## 2023-12-05 MED ORDER — PRAVASTATIN SODIUM 20 MG PO TABS
20.0000 mg | ORAL_TABLET | Freq: Every day | ORAL | 3 refills | Status: AC
  Filled 2023-12-05 (×2): qty 90, 90d supply, fill #0

## 2023-12-05 MED ORDER — VENLAFAXINE HCL 25 MG PO TABS
12.5000 mg | ORAL_TABLET | Freq: Two times a day (BID) | ORAL | 0 refills | Status: DC
  Filled 2023-12-05: qty 20, 20d supply, fill #0

## 2023-12-06 ENCOUNTER — Encounter: Payer: Self-pay | Admitting: Family Medicine

## 2023-12-06 LAB — CBC
HCT: 42.5 % (ref 35.0–45.0)
Hemoglobin: 14.2 g/dL (ref 11.7–15.5)
MCH: 30.3 pg (ref 27.0–33.0)
MCHC: 33.4 g/dL (ref 32.0–36.0)
MCV: 90.8 fL (ref 80.0–100.0)
MPV: 10.1 fL (ref 7.5–12.5)
Platelets: 195 10*3/uL (ref 140–400)
RBC: 4.68 10*6/uL (ref 3.80–5.10)
RDW: 11.9 % (ref 11.0–15.0)
WBC: 5.1 10*3/uL (ref 3.8–10.8)

## 2023-12-06 LAB — LIPID PANEL
Cholesterol: 221 mg/dL — ABNORMAL HIGH (ref <200)
HDL: 47 mg/dL — ABNORMAL LOW (ref >=50)
LDL Cholesterol (Calc): 149 mg/dL — ABNORMAL HIGH
Non-HDL Cholesterol (Calc): 174 mg/dL — ABNORMAL HIGH (ref <130)
Total CHOL/HDL Ratio: 4.7 (calc) (ref <5.0)
Triglycerides: 126 mg/dL (ref <150)

## 2023-12-06 LAB — COMPREHENSIVE METABOLIC PANEL WITH GFR
AG Ratio: 2 (calc) (ref 1.0–2.5)
ALT: 26 U/L (ref 6–29)
AST: 18 U/L (ref 10–30)
Albumin: 4.6 g/dL (ref 3.6–5.1)
Alkaline phosphatase (APISO): 47 U/L (ref 31–125)
BUN: 12 mg/dL (ref 7–25)
CO2: 23 mmol/L (ref 20–32)
Calcium: 9.6 mg/dL (ref 8.6–10.2)
Chloride: 104 mmol/L (ref 98–110)
Creat: 0.76 mg/dL (ref 0.50–0.97)
Globulin: 2.3 g/dL (ref 1.9–3.7)
Glucose, Bld: 90 mg/dL (ref 65–99)
Potassium: 4.2 mmol/L (ref 3.5–5.3)
Sodium: 139 mmol/L (ref 135–146)
Total Bilirubin: 0.7 mg/dL (ref 0.2–1.2)
Total Protein: 6.9 g/dL (ref 6.1–8.1)
eGFR: 103 mL/min/{1.73_m2} (ref >=60)

## 2023-12-06 LAB — HEMOGLOBIN A1C
Hgb A1c MFr Bld: 5.4 % (ref <5.7)
Mean Plasma Glucose: 108 mg/dL
eAG (mmol/L): 6 mmol/L

## 2023-12-06 LAB — TSH: TSH: 0.96 m[IU]/L

## 2023-12-26 ENCOUNTER — Other Ambulatory Visit (HOSPITAL_COMMUNITY): Payer: Self-pay

## 2023-12-27 ENCOUNTER — Other Ambulatory Visit (HOSPITAL_COMMUNITY): Payer: Self-pay

## 2024-01-08 ENCOUNTER — Encounter: Payer: Self-pay | Admitting: Family Medicine

## 2024-01-08 DIAGNOSIS — E782 Mixed hyperlipidemia: Secondary | ICD-10-CM

## 2024-01-08 DIAGNOSIS — E66811 Obesity, class 1: Secondary | ICD-10-CM

## 2024-01-08 DIAGNOSIS — K760 Fatty (change of) liver, not elsewhere classified: Secondary | ICD-10-CM

## 2024-01-08 MED ORDER — TIRZEPATIDE-WEIGHT MANAGEMENT 2.5 MG/0.5ML ~~LOC~~ SOAJ
2.5000 mg | SUBCUTANEOUS | 1 refills | Status: DC
  Filled 2024-01-08 – 2024-04-28 (×2): qty 2, 28d supply, fill #0

## 2024-01-08 NOTE — Addendum Note (Signed)
 Addended by: WATT RAISIN C on: 01/08/2024 08:51 PM   Modules accepted: Orders

## 2024-01-09 ENCOUNTER — Other Ambulatory Visit (HOSPITAL_COMMUNITY): Payer: Self-pay

## 2024-01-11 ENCOUNTER — Other Ambulatory Visit (HOSPITAL_COMMUNITY): Payer: Self-pay

## 2024-01-28 ENCOUNTER — Other Ambulatory Visit: Payer: Self-pay | Admitting: Family Medicine

## 2024-01-28 ENCOUNTER — Other Ambulatory Visit (HOSPITAL_COMMUNITY): Payer: Self-pay

## 2024-01-28 DIAGNOSIS — F988 Other specified behavioral and emotional disorders with onset usually occurring in childhood and adolescence: Secondary | ICD-10-CM

## 2024-01-28 MED ORDER — AMPHETAMINE-DEXTROAMPHETAMINE 20 MG PO TABS
20.0000 mg | ORAL_TABLET | Freq: Every day | ORAL | 0 refills | Status: DC
  Filled 2024-01-28: qty 60, 30d supply, fill #0

## 2024-01-28 MED ORDER — AMPHETAMINE-DEXTROAMPHETAMINE 20 MG PO TABS
20.0000 mg | ORAL_TABLET | Freq: Every day | ORAL | 0 refills | Status: DC
  Filled 2024-01-28 – 2024-02-27 (×2): qty 60, 30d supply, fill #0

## 2024-02-27 ENCOUNTER — Other Ambulatory Visit (HOSPITAL_COMMUNITY): Payer: Self-pay

## 2024-02-28 ENCOUNTER — Other Ambulatory Visit (HOSPITAL_COMMUNITY): Payer: Self-pay

## 2024-03-05 ENCOUNTER — Ambulatory Visit: Admitting: Physician Assistant

## 2024-03-12 DIAGNOSIS — Z135 Encounter for screening for eye and ear disorders: Secondary | ICD-10-CM | POA: Diagnosis not present

## 2024-03-12 DIAGNOSIS — H52223 Regular astigmatism, bilateral: Secondary | ICD-10-CM | POA: Diagnosis not present

## 2024-03-28 ENCOUNTER — Other Ambulatory Visit (HOSPITAL_COMMUNITY): Payer: Self-pay

## 2024-03-28 ENCOUNTER — Other Ambulatory Visit: Payer: Self-pay

## 2024-03-28 ENCOUNTER — Other Ambulatory Visit: Payer: Self-pay | Admitting: Family Medicine

## 2024-03-28 DIAGNOSIS — F988 Other specified behavioral and emotional disorders with onset usually occurring in childhood and adolescence: Secondary | ICD-10-CM

## 2024-03-28 MED ORDER — AMPHETAMINE-DEXTROAMPHETAMINE 20 MG PO TABS
20.0000 mg | ORAL_TABLET | Freq: Every day | ORAL | 0 refills | Status: DC
  Filled 2024-03-28 – 2024-04-28 (×2): qty 60, 30d supply, fill #0

## 2024-03-28 MED ORDER — AMPHETAMINE-DEXTROAMPHETAMINE 20 MG PO TABS
20.0000 mg | ORAL_TABLET | Freq: Every day | ORAL | 0 refills | Status: DC
  Filled 2024-03-28: qty 60, 30d supply, fill #0

## 2024-04-15 DIAGNOSIS — Z01419 Encounter for gynecological examination (general) (routine) without abnormal findings: Secondary | ICD-10-CM | POA: Diagnosis not present

## 2024-04-15 DIAGNOSIS — Z6833 Body mass index (BMI) 33.0-33.9, adult: Secondary | ICD-10-CM | POA: Diagnosis not present

## 2024-04-27 ENCOUNTER — Encounter (HOSPITAL_COMMUNITY): Payer: Self-pay

## 2024-04-28 ENCOUNTER — Ambulatory Visit: Payer: Self-pay

## 2024-04-28 ENCOUNTER — Other Ambulatory Visit: Payer: Self-pay

## 2024-04-28 ENCOUNTER — Other Ambulatory Visit (HOSPITAL_COMMUNITY): Payer: Self-pay

## 2024-04-28 NOTE — Telephone Encounter (Signed)
 Appt scheduled

## 2024-04-28 NOTE — Telephone Encounter (Signed)
 FYI Only or Action Required?: FYI only for provider: appointment scheduled on 11/4.  Patient was last seen in primary care on 12/05/2023 by Copland, [name], MD.  Called Nurse Triage reporting Rectal Bleeding.  Symptoms began today.  Interventions attempted: Nothing.  Symptoms are: stable.  Triage Disposition: See Physician Within 24 Hours  Patient/caregiver understands and will follow disposition?: Yes      Copied from CRM [id number]. Topic: Clinical - Red Word Triage >> Apr 28, 2024 10:27 AM [name] wrote: Red Word that prompted transfer to Nurse Triage: Stated she had heart burn and then had bloody stool, not sure if its a ulcer and she need to go to ER >> Apr 28, 2024 10:33 AM [name] wrote: Patient disconnected while waiting on line for nurse, unable to reach patient back. Please call patient back  Reason for Disposition  MODERATE rectal bleeding (e.g., small blood clots, passing blood without stool, or toilet water turns red)  Answer Assessment - Initial Assessment Questions 1. APPEARANCE of BLOOD: What color is it? Is it passed separately, on the surface of the stool, or mixed in with the stool?       This morning, blood was mixed in with stool, amount unknown as it was mixed in the water  2. AMOUNT: How much blood was passed?      Unknown   3. FREQUENCY: How many times has blood been passed with the stools?      X1 this morning   4. ONSET: When was the blood first seen in the stools? (Days or weeks)      X 1 today   5. DIARRHEA: Is there also some diarrhea? If Yes, ask: How many diarrhea stools in the past 24 hours?      No   6. CONSTIPATION: Do you have constipation? If Yes, ask: How bad is it?     No   7. RECURRENT SYMPTOMS: Have you had blood in your stools before? If Yes, ask: When was the last time? and What happened that time?      No   8. BLOOD THINNERS: Do you take any blood thinners? (e.g., aspirin, clopidogrel / Plavix,  coumadin, heparin). Notes: Other strong blood thinners include: Arixtra (fondaparinux), Eliquis (apixaban), Pradaxa (dabigatran), and Xarelto (rivaroxaban).     No   9. OTHER SYMPTOMS: Do you have any other symptoms?  (e.g., abdomen pain, vomiting, dizziness, fever)     No   10. PREGNANCY: Is there any chance you are pregnant? When was your last menstrual period?      No   Rectal bleeding has subsided. She also mentioned Heart burn ongoing since the Beginning of October. Pepcid , Tums for the heartburn is providing some relief. Appt. Scheduled for 11/4  Protocols used: Rectal Bleeding-A-AH

## 2024-04-29 ENCOUNTER — Ambulatory Visit: Payer: Self-pay | Admitting: Medical

## 2024-04-29 ENCOUNTER — Ambulatory Visit: Admitting: Medical

## 2024-04-29 ENCOUNTER — Other Ambulatory Visit (HOSPITAL_COMMUNITY): Payer: Self-pay

## 2024-04-29 VITALS — BP 122/84 | HR 74 | Temp 98.4°F | Resp 14 | Ht 65.0 in | Wt 201.8 lb

## 2024-04-29 DIAGNOSIS — R1010 Upper abdominal pain, unspecified: Secondary | ICD-10-CM

## 2024-04-29 DIAGNOSIS — R110 Nausea: Secondary | ICD-10-CM

## 2024-04-29 DIAGNOSIS — K219 Gastro-esophageal reflux disease without esophagitis: Secondary | ICD-10-CM | POA: Diagnosis not present

## 2024-04-29 DIAGNOSIS — K921 Melena: Secondary | ICD-10-CM | POA: Diagnosis not present

## 2024-04-29 LAB — CBC WITH DIFFERENTIAL/PLATELET
Basophils Absolute: 0 K/uL (ref 0.0–0.1)
Basophils Relative: 1 % (ref 0.0–3.0)
Eosinophils Absolute: 0.2 K/uL (ref 0.0–0.7)
Eosinophils Relative: 3.6 % (ref 0.0–5.0)
HCT: 42.3 % (ref 36.0–46.0)
Hemoglobin: 14.3 g/dL (ref 12.0–15.0)
Lymphocytes Relative: 38.9 % (ref 12.0–46.0)
Lymphs Abs: 2 K/uL (ref 0.7–4.0)
MCHC: 33.8 g/dL (ref 30.0–36.0)
MCV: 88.5 fl (ref 78.0–100.0)
Monocytes Absolute: 0.3 K/uL (ref 0.1–1.0)
Monocytes Relative: 5.5 % (ref 3.0–12.0)
Neutro Abs: 2.6 K/uL (ref 1.4–7.7)
Neutrophils Relative %: 51 % (ref 43.0–77.0)
Platelets: 209 K/uL (ref 150.0–400.0)
RBC: 4.78 Mil/uL (ref 3.87–5.11)
RDW: 12.5 % (ref 11.5–15.5)
WBC: 5 K/uL (ref 4.0–10.5)

## 2024-04-29 MED ORDER — FAMOTIDINE 20 MG PO TABS
20.0000 mg | ORAL_TABLET | Freq: Every day | ORAL | 0 refills | Status: AC
  Filled 2024-04-29: qty 30, 30d supply, fill #0

## 2024-04-29 NOTE — Patient Instructions (Signed)
 Gastroesophageal reflux disease (GERD) with upper abdominal pain and nausea Intermittent heartburn with increased severity, constant burning sensation radiating to the back, nausea, and upper abdominal pain. Symptoms not relieved by OTC medications. Possible GERD exacerbation. Differential includes GERD and possible stomach ulcer. - Increased omeprazole to 40 mg daily before breakfast. - Added famotidine  20 mg at night. - Advised dietary modifications: reduce caffeine  intake, avoid fried and greasy foods. - Ordered CBC, metabolic panel, and lipase. - Gi referral to be placed after lab review.  Rectal bleeding, possible hemorrhoidal origin Single episode of bright red rectal bleeding. Dark black stool possibly due to Pepto Bismol. Hemorrhoids present but not flared. Differential includes hemorrhoidal bleeding, fissure, or other lower GI source. No immediate concern for malignancy. - Monitor for recurrent rectal bleeding. Stop pepto and notify me of any black stools. - Stop pepto.  Follow up date to be determined after lab review. Keep appt with pcp as regularly scheduled.

## 2024-04-29 NOTE — Progress Notes (Signed)
 Subjective:    Patient ID: [place] [name], female    DOB: 1985-07-27, 38 y.o.   MRN: [id number]  HPI  Tonya [name] is a 38 year old female who presents with rectal bleeding and persistent heartburn.  She experienced rectal bleeding for the first time during a bowel movement yesterday. The water in the toilet was bright red, with darker blood upon wiping. She has a history of hemorrhoids but notes that this episode of bleeding was more significant than previous occurrences and was not associated with a hemorrhoid flare-up or constipation.  She has been experiencing persistent heartburn since the beginning of October, which has worsened over the past four to five days. Initially, she managed the symptoms with Pepto Bismol and Tums, but these have become ineffective. She started taking Prilosec (omeprazole) 20 mg once daily two days ago, which has provided some relief. She also uses Maalox. The heartburn is described as a constant burning sensation that radiates to her back, with slight nausea and decreased appetite, likely due to the persistent heartburn. No right upper quadrant pain is present.  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 She attributes some of the gastrointestinal discomfort to increased chocolate consumption around Halloween. She does not smoke and occasionally drinks alcohol but has abstained recently due to her symptoms.  Her grandfather died of a bleeding stomach ulcer, which raises her concern about her current symptoms. Her last menstrual period was two months ago, and she has a two-year-old child. She is on ocp and recently saw gyn who told her no menses due to ocp.     Lmp- pt on loesterin and told likely why not menstruating.     Review of Systems  Constitutional:  Negative for chills and fatigue.  Respiratory:  Negative for chest tightness,  shortness of breath and wheezing.   Cardiovascular:  Negative for chest pain and palpitations.  Gastrointestinal:  Positive for abdominal pain. Negative for blood in stool, constipation, diarrhea, rectal pain and vomiting.  Genitourinary:  Negative for dysuria and frequency.  Skin:  Negative for rash.  Neurological:  Negative for dizziness.  Psychiatric/Behavioral:  Positive for dysphoric mood. Negative for agitation, sleep disturbance and suicidal ideas. The patient is nervous/anxious.        Pt states many meds caused various side effects. Pt not wanting to try other meds presently.   Past Medical History:  Diagnosis Date   ADD (attention deficit disorder)    Anxiety    Gestational diabetes    Hepatic steatosis 01/14/2020   Hyperlipidemia    S/P cesarean section 12/03/2016     Social History   Socioeconomic History   Marital status: Married    Spouse name: Not on file   Number of children: 1   Years of education: Not on file   Highest education level: Bachelor's degree (e.g., BA, AB, BS)  Occupational History   Occupation: RN   Tobacco Use   Smoking status: Never   Smokeless tobacco: Never  Vaping Use   Vaping status: Never Used  Substance and Sexual Activity   Alcohol use: Yes    Alcohol/week: 3.0 standard drinks of alcohol    Types: 3 Glasses of wine per week   Drug use: No   Sexual activity: Yes    Partners: Male    Birth control/protection: Rhythm  Other Topics Concern   Not on file  Social History  Narrative   Not on file   Social Drivers of Health   Financial Resource Strain: Low Risk  (08/30/2023)   Overall Financial Resource Strain (CARDIA)    Difficulty of Paying Living Expenses: Not hard at all  Food Insecurity: No Food Insecurity (08/30/2023)   Hunger Vital Sign    Worried About Running Out of Food in the Last Year: Never true    Ran Out of Food in the Last Year: Never true  Transportation Needs: No Transportation Needs (08/30/2023)   PRAPARE -  Administrator, Civil Service (Medical): No    Lack of Transportation (Non-Medical): No  Physical Activity: Insufficiently Active (08/30/2023)   Exercise Vital Sign    Days of Exercise per Week: 2 days    Minutes of Exercise per Session: 30 min  Stress: Stress Concern Present (08/30/2023)   [employer] of Occupational Health - Occupational Stress Questionnaire    Feeling of Stress : To some extent  Social Connections: Socially Integrated (08/30/2023)   Social Connection and Isolation Panel    Frequency of Communication with Friends and Family: More than three times a week    Frequency of Social Gatherings with Friends and Family: More than three times a week    Attends Religious Services: 1 to 4 times per year    Active Member of Clubs or Organizations: Yes    Attends Banker Meetings: More than 4 times per year    Marital Status: Married  Catering Manager Violence: Unknown (09/30/2021)   Received from [facility]   HITS    Physically Hurt: Not on file    Insult or Talk Down To: Not on file    Threaten Physical Harm: Not on file    Scream or Curse: Not on file    Past Surgical History:  Procedure Laterality Date   CESAREAN SECTION N/A 12/03/2016   Procedure: CESAREAN SECTION;  Surgeon: Mat Browning, MD;  Location: [facility] BIRTHING SUITES;  Service: Obstetrics;  Laterality: N/A;   CESAREAN SECTION N/A 12/29/2021   Procedure: CESAREAN SECTION RPT X1;  Surgeon: Dannielle Bouchard, DO;  Location: [facility] LD ORS;  Service: Obstetrics;  Laterality: N/A;   WISDOM TOOTH EXTRACTION      Family History  Problem Relation Age of Onset   Hypertension Mother    Depression Mother    Hypertension Father    Prostate cancer Father 24       prostate   Hyperlipidemia Brother    Ovarian cancer Paternal Grandmother        ? GYN cancer   Lupus Paternal Grandmother    Eczema Son    Lupus Paternal Aunt     No Known Allergies  Current Outpatient Medications on File Prior to Visit   Medication Sig Dispense Refill   albuterol  (VENTOLIN  HFA) 108 (90 Base) MCG/ACT inhaler Inhale 1-2 puffs into the lungs every 6 (six) hours as needed. 8 g 0   amphetamine -dextroamphetamine  (ADDERALL) 20 MG tablet Take 1-2 tablets (20-40 mg total) by mouth daily for add 60 tablet 0   amphetamine -dextroamphetamine  (ADDERALL) 20 MG tablet Take 1-2 tablets (20-40 mg total) by mouth daily for attention deficit disorder 60 tablet 0   Azelaic Acid  15 % gel After skin is thoroughly washed and patted dry, gently but thoroughly massage a thin film of azelaic acid  cream into the affected area twice daily, in the morning and evening. 50 g 0   brompheniramine-pseudoephedrine-DM 30-2-10 MG/5ML syrup Take 5 mLs by mouth 4 (four)  times daily as needed. 120 mL 0   butalbital -acetaminophen -caffeine  (FIORICET ) 50-325-40 MG tablet Take 1-2 tablets by mouth every 6 (six) hours as needed for headache. Max: 6 Tablets Daily 20 tablet 1   Cholecalciferol  (VITAMIN D3) 50 MCG (2000 UT) capsule      fluticasone  (FLONASE ) 50 MCG/ACT nasal spray Place 2 sprays into both nostrils daily. 16 g 6   ipratropium (ATROVENT ) 0.03 % nasal spray Place 2 sprays into the nose 3 (three) times daily. 30 mL 2   LO LOESTRIN FE 1 MG-10 MCG / 10 MCG tablet Take 1 tablet by mouth daily.     Multiple Vitamin (MULTIVITAMIN) tablet Take 1 tablet by mouth daily.     mupirocin  ointment (BACTROBAN ) 2 % Apply to affected area 2 times a day 22 g 0   ondansetron  (ZOFRAN -ODT) 4 MG disintegrating tablet Dissolve 1 tablet (4 mg total) by mouth every 8 (eight) hours as needed for nausea or vomiting. 20 tablet 0   pravastatin  (PRAVACHOL ) 20 MG tablet Take 1 tablet (20 mg total) by mouth daily. 90 tablet 3   tirzepatide  (ZEPBOUND ) 2.5 MG/0.5ML Pen Inject 2.5 mg into the skin once a week. 2 mL 1   No current facility-administered medications on file prior to visit.    BP 122/84   Pulse 74   Temp 98.4 F (36.9 C) (Oral)   Resp 14   Ht 5' 5 (1.651 m)    Wt 201 lb 12.8 oz (91.5 kg)   LMP 02/05/2024   SpO2 99%   BMI 33.58 kg/m         Objective:   Physical Exam  General Mental Status- Alert. General Appearance- Not in acute distress.   Skin General: Color- Normal Color. Moisture- Normal Moisture.  Neck No JVD.  Chest and Lung Exam Auscultation: Breath Sounds:-Normal.  Cardiovascular Auscultation:Rythm- Regular. Murmurs & Other Heart Sounds:Auscultation of the heart reveals- No Murmurs.  Abdomen Inspection:-Inspeection Normal. Palpation/Percussion:Note:No mass. Palpation and Percussion of the abdomen reveal- Non Tender, Non Distended + BS, no rebound or guarding.   Neurologic Cranial Nerve exam:- CN III-XII intact(No nystagmus), symmetric smile. Strength:- 5/5 equal and symmetric strength both upper and lower extremities.   Rectal exam- deferred by pt since no hemorrhoid type bulge or pain.     Assessment & Plan:   Patient Instructions  Gastroesophageal reflux disease (GERD) with upper abdominal pain and nausea Intermittent heartburn with increased severity, constant burning sensation radiating to the back, nausea, and upper abdominal pain. Symptoms not relieved by OTC medications. Possible GERD exacerbation. Differential includes GERD and possible stomach ulcer. - Increased omeprazole to 40 mg daily before breakfast. - Added famotidine  20 mg at night. - Advised dietary modifications: reduce caffeine  intake, avoid fried and greasy foods. - Ordered CBC, metabolic panel, and lipase. - Gi referral to be placed after lab review.  Rectal bleeding, possible hemorrhoidal origin Single episode of bright red rectal bleeding. Dark black stool possibly due to Pepto Bismol. Hemorrhoids present but not flared. Differential includes hemorrhoidal bleeding, fissure, or other lower GI source. No immediate concern for malignancy. - Monitor for recurrent rectal bleeding. Stop pepto and notify me of any black stools. - Stop  pepto.  Follow up date to be determined after lab review. Keep appt with pcp as regularly scheduled.    Kennice Finnie, PA-C

## 2024-04-30 LAB — COMPREHENSIVE METABOLIC PANEL WITH GFR
ALT: 25 U/L (ref 0–35)
AST: 17 U/L (ref 0–37)
Albumin: 4.5 g/dL (ref 3.5–5.2)
Alkaline Phosphatase: 48 U/L (ref 39–117)
BUN: 15 mg/dL (ref 6–23)
CO2: 26 meq/L (ref 19–32)
Calcium: 9.1 mg/dL (ref 8.4–10.5)
Chloride: 104 meq/L (ref 96–112)
Creatinine, Ser: 0.82 mg/dL (ref 0.40–1.20)
GFR: 91.05 mL/min (ref >60.00)
Glucose, Bld: 78 mg/dL (ref 70–99)
Potassium: 4.4 meq/L (ref 3.5–5.1)
Sodium: 139 meq/L (ref 135–145)
Total Bilirubin: 0.5 mg/dL (ref 0.2–1.2)
Total Protein: 6.8 g/dL (ref 6.0–8.3)

## 2024-04-30 LAB — LIPASE: Lipase: 16 U/L (ref 11.0–59.0)

## 2024-05-28 ENCOUNTER — Other Ambulatory Visit: Payer: Self-pay | Admitting: Family Medicine

## 2024-05-28 DIAGNOSIS — F988 Other specified behavioral and emotional disorders with onset usually occurring in childhood and adolescence: Secondary | ICD-10-CM

## 2024-05-29 ENCOUNTER — Other Ambulatory Visit (HOSPITAL_COMMUNITY): Payer: Self-pay

## 2024-05-29 MED ORDER — AMPHETAMINE-DEXTROAMPHETAMINE 20 MG PO TABS
20.0000 mg | ORAL_TABLET | Freq: Every day | ORAL | 0 refills | Status: DC
  Filled 2024-05-29: qty 60, 30d supply, fill #0

## 2024-06-20 ENCOUNTER — Encounter (INDEPENDENT_AMBULATORY_CARE_PROVIDER_SITE_OTHER): Payer: Self-pay | Admitting: Family Medicine

## 2024-06-20 DIAGNOSIS — J111 Influenza due to unidentified influenza virus with other respiratory manifestations: Secondary | ICD-10-CM

## 2024-06-20 MED ORDER — OSELTAMIVIR PHOSPHATE 75 MG PO CAPS: 75.0000 mg | ORAL_CAPSULE | Freq: Two times a day (BID) | ORAL | 0 refills | Status: AC

## 2024-06-20 NOTE — Telephone Encounter (Signed)
Please see the MyChart message reply(ies) for my assessment and plan.  The patient gave consent for this Medical Advice Message and is aware that it may result in a bill to their insurance company as well as the possibility that this may result in a co-payment or deductible. They are an established patient, but are not seeking medical advice exclusively about a problem treated during an in person or video visit in the last 7 days. I did not recommend an in person or video visit within 7 days of my reply.  I spent a total of7 minutes cumulative time within 7 days through MyChart messaging Sheily Lineman, MD  

## 2024-06-23 MED ORDER — HYDROCODONE BIT-HOMATROP MBR 5-1.5 MG/5ML PO SOLN: 5.0000 mL | Freq: Three times a day (TID) | ORAL | 0 refills | Status: AC | PRN

## 2024-06-23 NOTE — Addendum Note (Signed)
 Addended by: WATT [name] on: 06/23/2024 08:44 PM   Modules accepted: Orders

## 2024-06-30 ENCOUNTER — Other Ambulatory Visit (HOSPITAL_COMMUNITY): Payer: Self-pay

## 2024-06-30 ENCOUNTER — Other Ambulatory Visit: Payer: Self-pay

## 2024-06-30 ENCOUNTER — Telehealth

## 2024-06-30 DIAGNOSIS — B9689 Other specified bacterial agents as the cause of diseases classified elsewhere: Secondary | ICD-10-CM

## 2024-06-30 DIAGNOSIS — F988 Other specified behavioral and emotional disorders with onset usually occurring in childhood and adolescence: Secondary | ICD-10-CM

## 2024-06-30 DIAGNOSIS — J069 Acute upper respiratory infection, unspecified: Secondary | ICD-10-CM | POA: Diagnosis not present

## 2024-06-30 MED ORDER — AZITHROMYCIN 250 MG PO TABS: ORAL_TABLET | ORAL | 0 refills | Status: AC

## 2024-06-30 MED ORDER — BENZONATATE 100 MG PO CAPS: 100.0000 mg | ORAL_CAPSULE | Freq: Three times a day (TID) | ORAL | 0 refills | Status: AC | PRN

## 2024-06-30 MED ORDER — PREDNISONE 10 MG (21) PO TBPK: ORAL_TABLET | ORAL | 0 refills | Status: AC

## 2024-06-30 MED ORDER — AMPHETAMINE-DEXTROAMPHETAMINE 20 MG PO TABS
20.0000 mg | ORAL_TABLET | Freq: Every day | ORAL | 0 refills | Status: DC
  Filled 2024-06-30: qty 60, 30d supply, fill #0

## 2024-06-30 NOTE — Addendum Note (Signed)
 Addended by: KENNYTH LAURAINE BRAVO on: 06/30/2024 09:32 AM   Modules accepted: Orders

## 2024-06-30 NOTE — Progress Notes (Signed)
 We are sorry that you are not feeling well.  Here is how we plan to help!  Based on your presentation I believe you most likely have A cough due to bacteria.  When patients have a fever and a productive cough with a change in color or increased sputum production, we are concerned about bacterial bronchitis.  If left untreated it can progress to pneumonia.  If your symptoms do not improve with your treatment plan it is important that you contact your provider.   I have prescribed Azithromyin 250 mg: two tablets now and then one tablet daily for 4 additonal days    In addition you may use A prescription cough medication called Tessalon  Perles 100mg . You may take 1-2 capsules every 8 hours as needed for your cough.  I have also sent in a prednisone  pack to take as directed.  From your responses in the eVisit questionnaire you describe inflammation in the upper respiratory tract which is causing a significant cough.  This is commonly called Bronchitis and has four common causes:   Allergies Viral Infections Acid Reflux Bacterial Infection Allergies, viruses and acid reflux are treated by controlling symptoms or eliminating the cause. An example might be a cough caused by taking certain blood pressure medications. You stop the cough by changing the medication. Another example might be a cough caused by acid reflux. Controlling the reflux helps control the cough.  USE OF BRONCHODILATOR (RESCUE) INHALERS: There is a risk from using your bronchodilator too frequently.  The risk is that over-reliance on a medication which only relaxes the muscles surrounding the breathing tubes can reduce the effectiveness of medications prescribed to reduce swelling and congestion of the tubes themselves.  Although you feel brief relief from the bronchodilator inhaler, your asthma may actually be worsening with the tubes becoming more swollen and filled with mucus.  This can delay other crucial treatments, such as oral  steroid medications. If you need to use a bronchodilator inhaler daily, several times per day, you should discuss this with your provider.  There are probably better treatments that could be used to keep your asthma under control.     HOME CARE Only take medications as instructed by your medical team. Complete the entire course of an antibiotic. Drink plenty of fluids and get plenty of rest. Avoid close contacts especially the very young and the elderly Cover your mouth if you cough or cough into your sleeve. Always remember to wash your hands A steam or ultrasonic humidifier can help congestion.   GET HELP RIGHT AWAY IF: You develop worsening fever. You become short of breath You cough up blood. Your symptoms persist after you have completed your treatment plan MAKE SURE YOU  Understand these instructions. Will watch your condition. Will get help right away if you are not doing well or get worse.  Your e-visit answers were reviewed by a board certified advanced clinical practitioner to complete your personal care plan.  Depending on the condition, your plan could have included both over the counter or prescription medications. If there is a problem please reply  once you have received a response from your provider. Your safety is important to us .  If you have drug allergies check your prescription carefully.    You can use MyChart to ask questions about todays visit, request a non-urgent call back, or ask for a work or school excuse for 24 hours related to this e-Visit. If it has been greater than 24 hours you will  need to follow up with your provider, or enter a new e-Visit to address those concerns. You will get an e-mail in the next two days asking about your experience.  I hope that your e-visit has been valuable and will speed your recovery. Thank you for using e-visits.   I have spent 5 minutes in review of e-visit questionnaire, review and updating patient chart, medical  decision making and response to patient.   Elsie Velma Lunger, PA-C

## 2024-07-01 ENCOUNTER — Other Ambulatory Visit: Payer: Self-pay

## 2024-07-01 ENCOUNTER — Other Ambulatory Visit (HOSPITAL_COMMUNITY): Payer: Self-pay

## 2024-07-22 NOTE — Progress Notes (Unsigned)
 [facility] [address] [place], [place] [address] [phone]  Date:  07/23/2024   Name:  Tonya [name]   DOB:  Nov 06, 1985   MRN:  [id number]  PCP:  Watt [name], MD    Chief Complaint: No chief complaint on file.   History of Present Illness:  Tonya [name] is a 39 y.o. very pleasant female patient who presents with the following:  Patient seen today virtually for periodic follow-up.  Patient location is home, my location is office.  Patient identity confirmed with 2 factors, she gives consent for virtual visit today I saw her most recently in June She has a history of ADD, anxiety, obesity, hyperlipidemia, elevated LFTs/fatty liver, vitamin D  deficiency    She works as an Therapist, Sports on oncology at [employer] Married with 2 young children- 7 and 2 yo    She has gynecology care per Dr. Dannielle  We are managing her ADD with Adderall regular release 20, 1 to 2 tablets daily I did start her on Zepbound  last year but I do not think she continued taking it  PDMP reviewed, she filled #[id number] Adderall 20 on January 5  Discussed the use of AI scribe software for clinical note transcription with the patient, who gave verbal consent to proceed.  History of Present Illness Tonya [name] is a 39 year old female who presents for follow-up regarding medication management and weight concerns.  She has been taking Adderall, regular release, 20 mg one to two times daily. She has been on Adderall for years. She typically takes two doses on workdays and sometimes one dose on non-workdays. She reports that taking two doses helps more, especially with her weight, and she feels her body has grown used to the medication over time. She does not have any bothersome SE and tolerates well   She has been experiencing weight gain and is interested in exploring options to address this.  Previously, she attempted to use Zepbound , but it was not covered by her insurance. She is considering direct-to-patient pharmacy options for oral Wegovy  (semaglutide ) as a potential alternative. She will look into this and let me know if pricing is accessible to her    Patient Active Problem List   Diagnosis Date Noted   Previous cesarean section 12/29/2021   S/P cesarean section 12/29/2021   Vitamin D  deficiency 03/08/2020   Continuous RUQ abdominal pain 01/14/2020   Obesity (BMI 30.0-34.9) 01/02/2018   Hyperlipidemia    Anxiety    ADD (attention deficit disorder)     Past Medical History:  Diagnosis Date   ADD (attention deficit disorder)    Anxiety    Gestational diabetes    Hepatic steatosis 01/14/2020   Hyperlipidemia    S/P cesarean section 12/03/2016    Past Surgical History:  Procedure Laterality Date   CESAREAN SECTION N/A 12/03/2016   Procedure: CESAREAN SECTION;  Surgeon: Mat Browning, MD;  Location: [facility];  Service: Obstetrics;  Laterality: N/A;   CESAREAN SECTION N/A 12/29/2021   Procedure: CESAREAN SECTION RPT X1;  Surgeon: Dannielle Bouchard, DO;  Location: [facility];  Service: Obstetrics;  Laterality: N/A;   WISDOM TOOTH EXTRACTION      Social History[1]  Family History  Problem Relation Age of Onset   Hypertension Mother    Depression Mother    Hypertension Father    Prostate cancer Father 69  prostate   Hyperlipidemia Brother    Ovarian cancer Paternal Grandmother        ? GYN cancer   Lupus Paternal Grandmother    Eczema Son    Lupus Paternal Aunt     Allergies[2]  Medication list has been reviewed and updated.  Medications Ordered Prior to Encounter[3]  Review of Systems:  As per HPI- otherwise negative.   Physical Examination: There were no vitals filed for this visit. There were no vitals filed for this visit. There is no height or weight on file to calculate BMI. Ideal Body Weight:    Patient observed via  MyChart video- she looks well, her normal self.  No distress or SOB noted   Assessment and Plan: Attention deficit disorder (ADD) without hyperactivity - Plan: amphetamine -dextroamphetamine  (ADDERALL) 20 MG tablet, amphetamine -dextroamphetamine  (ADDERALL) 20 MG tablet, amphetamine -dextroamphetamine  (ADDERALL) 20 MG tablet  Assessment & Plan Attention deficit disorder without hyperactivity Managed with Adderall 20 mg, effective on workdays. Dosage adjusted based on activities. - Continue Adderall 20 mg, 1-2 tablets daily as needed. - Sent prescription refills to [facility].  Obesity Zepbound  not covered by insurance. Discussed oral Wegovy  (semaglutide ) as an alternative for weight management. - Explore direct-to-patient pharmacy options for oral Wegovy  through [facility]. - Contact provider if interested in oral Wegovy  for further assistance.  Signed Harlene Schroeder, MD     [1]  Social History Tobacco Use   Smoking status: Never   Smokeless tobacco: Never  Vaping Use   Vaping status: Never Used  Substance Use Topics   Alcohol use: Yes    Alcohol/week: 3.0 standard drinks of alcohol    Types: 3 Glasses of wine per week   Drug use: No  [2] No Known Allergies [3]  Current Outpatient Medications on File Prior to Visit  Medication Sig Dispense Refill   albuterol  (VENTOLIN  HFA) 108 (90 Base) MCG/ACT inhaler Inhale 1-2 puffs into the lungs every 6 (six) hours as needed. 8 g 0   Azelaic Acid  15 % gel After skin is thoroughly washed and patted dry, gently but thoroughly massage a thin film of azelaic acid  cream into the affected area twice daily, in the morning and evening. 50 g 0   benzonatate  (TESSALON ) 100 MG capsule Take 1 capsule (100 mg total) by mouth 3 (three) times daily as needed. 30 capsule 0   brompheniramine-pseudoephedrine-DM 30-2-10 MG/5ML syrup Take 5 mLs by mouth 4 (four) times daily as needed. 120 mL 0   butalbital -acetaminophen -caffeine  (FIORICET ) 50-325-40  MG tablet Take 1-2 tablets by mouth every 6 (six) hours as needed for headache. Max: 6 Tablets Daily 20 tablet 1   Cholecalciferol  (VITAMIN D3) 50 MCG (2000 UT) capsule      famotidine  (PEPCID ) 20 MG tablet Take 1 tablet (20 mg total) by mouth daily. 30 tablet 0   fluticasone  (FLONASE ) 50 MCG/ACT nasal spray Place 2 sprays into both nostrils daily. 16 g 6   HYDROcodone  bit-homatropine (HYCODAN) 5-1.5 MG/5ML syrup Take 5 mLs by mouth every 8 (eight) hours as needed for cough. 75 mL 0   ipratropium (ATROVENT ) 0.03 % nasal spray Place 2 sprays into the nose 3 (three) times daily. 30 mL 2   LO LOESTRIN FE 1 MG-10 MCG / 10 MCG tablet Take 1 tablet by mouth daily.     Multiple Vitamin (MULTIVITAMIN) tablet Take 1 tablet by mouth daily.     mupirocin  ointment (BACTROBAN ) 2 % Apply to affected area 2 times a day 22 g 0  ondansetron  (ZOFRAN -ODT) 4 MG disintegrating tablet Dissolve 1 tablet (4 mg total) by mouth every 8 (eight) hours as needed for nausea or vomiting. 20 tablet 0   oseltamivir  (TAMIFLU ) 75 MG capsule Take 1 capsule (75 mg total) by mouth 2 (two) times daily. 10 capsule 0   pravastatin  (PRAVACHOL ) 20 MG tablet Take 1 tablet (20 mg total) by mouth daily. 90 tablet 3   predniSONE  (STERAPRED UNI-PAK 21 TAB) 10 MG (21) TBPK tablet Take following package directions 21 tablet 0   tirzepatide  (ZEPBOUND ) 2.5 MG/0.5ML Pen Inject 2.5 mg into the skin once a week. 2 mL 1   No current facility-administered medications on file prior to visit.   "

## 2024-07-23 ENCOUNTER — Other Ambulatory Visit (HOSPITAL_COMMUNITY): Payer: Self-pay

## 2024-07-23 ENCOUNTER — Telehealth (INDEPENDENT_AMBULATORY_CARE_PROVIDER_SITE_OTHER): Admitting: Family Medicine

## 2024-07-23 DIAGNOSIS — F988 Other specified behavioral and emotional disorders with onset usually occurring in childhood and adolescence: Secondary | ICD-10-CM | POA: Diagnosis not present

## 2024-07-23 MED ORDER — AMPHETAMINE-DEXTROAMPHETAMINE 20 MG PO TABS
20.0000 mg | ORAL_TABLET | Freq: Every day | ORAL | 0 refills | Status: AC
  Filled 2024-07-23: qty 60, 30d supply, fill #0

## 2024-07-23 MED ORDER — AMPHETAMINE-DEXTROAMPHETAMINE 20 MG PO TABS
ORAL_TABLET | ORAL | 0 refills | Status: AC
  Filled 2024-07-23: qty 60, fill #0

## 2024-07-23 MED ORDER — AMPHETAMINE-DEXTROAMPHETAMINE 20 MG PO TABS
20.0000 mg | ORAL_TABLET | Freq: Every day | ORAL | 0 refills | Status: AC
  Filled 2024-07-23 – 2024-07-29 (×2): qty 60, 30d supply, fill #0

## 2024-07-25 ENCOUNTER — Encounter: Payer: Self-pay | Admitting: Family Medicine

## 2024-07-25 DIAGNOSIS — E66811 Obesity, class 1: Secondary | ICD-10-CM

## 2024-07-25 MED ORDER — WEGOVY 1.5 MG PO TABS: 1.5000 mg | ORAL_TABLET | Freq: Every day | ORAL | 3 refills | Status: AC

## 2024-07-28 ENCOUNTER — Other Ambulatory Visit (HOSPITAL_COMMUNITY): Payer: Self-pay

## 2024-07-29 ENCOUNTER — Other Ambulatory Visit (HOSPITAL_COMMUNITY): Payer: Self-pay

## 2024-07-29 ENCOUNTER — Other Ambulatory Visit: Payer: Self-pay

## 2024-10-01 ENCOUNTER — Ambulatory Visit: Admitting: Physician Assistant
